# Patient Record
Sex: Female | Born: 1940 | Race: White | Hispanic: No | Marital: Married | Smoking: Former smoker
Health system: Southern US, Community
[De-identification: ages and names within clinical notes are randomized; demographics above are authoritative.]

## PROBLEM LIST (undated history)

## (undated) DIAGNOSIS — J45909 Unspecified asthma, uncomplicated: Secondary | ICD-10-CM

## (undated) DIAGNOSIS — I10 Essential (primary) hypertension: Secondary | ICD-10-CM

## (undated) DIAGNOSIS — S42309A Unspecified fracture of shaft of humerus, unspecified arm, initial encounter for closed fracture: Secondary | ICD-10-CM

## (undated) HISTORY — PX: ABDOMINAL HYSTERECTOMY: SHX81

## (undated) HISTORY — PX: COLONOSCOPY: SHX174

---
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
## 1967-10-26 HISTORY — PX: ANKLE SURGERY: SHX546

## 1978-10-25 HISTORY — PX: LUMBAR DISC SURGERY: SHX700

## 1998-05-23 ENCOUNTER — Ambulatory Visit (HOSPITAL_COMMUNITY): Admission: RE | Admit: 1998-05-23 | Discharge: 1998-05-23 | Payer: Self-pay | Admitting: Internal Medicine

## 2000-01-12 ENCOUNTER — Encounter: Payer: Self-pay | Admitting: Internal Medicine

## 2000-01-12 ENCOUNTER — Ambulatory Visit (HOSPITAL_COMMUNITY): Admission: RE | Admit: 2000-01-12 | Discharge: 2000-01-12 | Payer: Self-pay | Admitting: Internal Medicine

## 2001-01-17 ENCOUNTER — Ambulatory Visit (HOSPITAL_COMMUNITY): Admission: RE | Admit: 2001-01-17 | Discharge: 2001-01-17 | Payer: Self-pay | Admitting: *Deleted

## 2001-01-17 ENCOUNTER — Encounter: Payer: Self-pay | Admitting: Internal Medicine

## 2002-03-14 ENCOUNTER — Encounter: Payer: Self-pay | Admitting: Internal Medicine

## 2002-03-14 ENCOUNTER — Ambulatory Visit (HOSPITAL_COMMUNITY): Admission: RE | Admit: 2002-03-14 | Discharge: 2002-03-14 | Payer: Self-pay | Admitting: Internal Medicine

## 2003-04-05 ENCOUNTER — Ambulatory Visit (HOSPITAL_COMMUNITY): Admission: RE | Admit: 2003-04-05 | Discharge: 2003-04-05 | Payer: Self-pay | Admitting: Orthopaedic Surgery

## 2003-04-05 ENCOUNTER — Encounter: Payer: Self-pay | Admitting: Internal Medicine

## 2004-07-24 ENCOUNTER — Ambulatory Visit (HOSPITAL_COMMUNITY): Admission: RE | Admit: 2004-07-24 | Discharge: 2004-07-24 | Payer: Self-pay | Admitting: Internal Medicine

## 2005-08-26 ENCOUNTER — Ambulatory Visit (HOSPITAL_COMMUNITY): Admission: RE | Admit: 2005-08-26 | Discharge: 2005-08-26 | Payer: Self-pay | Admitting: Internal Medicine

## 2006-10-06 ENCOUNTER — Ambulatory Visit (HOSPITAL_COMMUNITY): Admission: RE | Admit: 2006-10-06 | Discharge: 2006-10-06 | Payer: Self-pay | Admitting: Internal Medicine

## 2007-10-23 ENCOUNTER — Ambulatory Visit (HOSPITAL_COMMUNITY): Admission: RE | Admit: 2007-10-23 | Discharge: 2007-10-23 | Payer: Self-pay | Admitting: Internal Medicine

## 2008-10-24 ENCOUNTER — Ambulatory Visit (HOSPITAL_COMMUNITY): Admission: RE | Admit: 2008-10-24 | Discharge: 2008-10-24 | Payer: Self-pay | Admitting: Internal Medicine

## 2009-12-03 ENCOUNTER — Ambulatory Visit (HOSPITAL_COMMUNITY): Admission: RE | Admit: 2009-12-03 | Discharge: 2009-12-03 | Payer: Self-pay | Admitting: Internal Medicine

## 2010-04-16 ENCOUNTER — Ambulatory Visit (HOSPITAL_COMMUNITY): Admission: RE | Admit: 2010-04-16 | Discharge: 2010-04-16 | Payer: Self-pay | Admitting: Internal Medicine

## 2011-09-28 ENCOUNTER — Other Ambulatory Visit (HOSPITAL_COMMUNITY): Payer: Self-pay | Admitting: Internal Medicine

## 2011-09-28 DIAGNOSIS — Z1231 Encounter for screening mammogram for malignant neoplasm of breast: Secondary | ICD-10-CM

## 2011-09-29 ENCOUNTER — Ambulatory Visit (HOSPITAL_COMMUNITY)
Admission: RE | Admit: 2011-09-29 | Discharge: 2011-09-29 | Disposition: A | Discharge status: Home / Self Care | Payer: BC Managed Care – PPO | Source: Ambulatory Visit | Attending: Internal Medicine | Admitting: Internal Medicine

## 2011-09-29 DIAGNOSIS — Z1231 Encounter for screening mammogram for malignant neoplasm of breast: Secondary | ICD-10-CM | POA: Insufficient documentation

## 2012-10-06 ENCOUNTER — Other Ambulatory Visit (HOSPITAL_COMMUNITY): Payer: Self-pay | Admitting: Internal Medicine

## 2012-10-06 DIAGNOSIS — Z1231 Encounter for screening mammogram for malignant neoplasm of breast: Secondary | ICD-10-CM

## 2012-10-26 ENCOUNTER — Ambulatory Visit (HOSPITAL_COMMUNITY)
Admission: RE | Admit: 2012-10-26 | Discharge: 2012-10-26 | Disposition: A | Discharge status: Home / Self Care | Payer: BC Managed Care – PPO | Source: Ambulatory Visit | Attending: Internal Medicine | Admitting: Internal Medicine

## 2012-10-26 DIAGNOSIS — Z1231 Encounter for screening mammogram for malignant neoplasm of breast: Secondary | ICD-10-CM

## 2013-03-23 ENCOUNTER — Other Ambulatory Visit (HOSPITAL_COMMUNITY): Payer: Self-pay | Admitting: Internal Medicine

## 2013-03-23 DIAGNOSIS — M948X9 Other specified disorders of cartilage, unspecified sites: Secondary | ICD-10-CM

## 2013-04-11 ENCOUNTER — Other Ambulatory Visit (HOSPITAL_COMMUNITY): Payer: Self-pay | Admitting: Internal Medicine

## 2013-04-11 ENCOUNTER — Ambulatory Visit (HOSPITAL_COMMUNITY)
Admission: RE | Admit: 2013-04-11 | Discharge: 2013-04-11 | Disposition: A | Discharge status: Home / Self Care | Payer: BC Managed Care – PPO | Source: Ambulatory Visit | Attending: Internal Medicine | Admitting: Internal Medicine

## 2013-04-11 DIAGNOSIS — Z13820 Encounter for screening for osteoporosis: Secondary | ICD-10-CM | POA: Insufficient documentation

## 2013-04-11 DIAGNOSIS — M948X9 Other specified disorders of cartilage, unspecified sites: Secondary | ICD-10-CM

## 2013-04-11 DIAGNOSIS — Z780 Asymptomatic menopausal state: Secondary | ICD-10-CM | POA: Insufficient documentation

## 2015-02-11 ENCOUNTER — Other Ambulatory Visit (HOSPITAL_COMMUNITY): Payer: Self-pay | Admitting: Internal Medicine

## 2015-02-11 DIAGNOSIS — Z1231 Encounter for screening mammogram for malignant neoplasm of breast: Secondary | ICD-10-CM

## 2015-02-11 DIAGNOSIS — M8580 Other specified disorders of bone density and structure, unspecified site: Secondary | ICD-10-CM

## 2015-02-18 ENCOUNTER — Other Ambulatory Visit (HOSPITAL_COMMUNITY): Payer: Self-pay

## 2015-02-18 ENCOUNTER — Ambulatory Visit (HOSPITAL_COMMUNITY): Payer: Self-pay

## 2015-02-18 ENCOUNTER — Ambulatory Visit (HOSPITAL_COMMUNITY)
Admission: RE | Admit: 2015-02-18 | Discharge: 2015-02-18 | Disposition: A | Discharge status: Home / Self Care | Payer: BLUE CROSS/BLUE SHIELD | Source: Ambulatory Visit | Attending: Internal Medicine | Admitting: Internal Medicine

## 2015-02-18 DIAGNOSIS — Z1231 Encounter for screening mammogram for malignant neoplasm of breast: Secondary | ICD-10-CM | POA: Diagnosis present

## 2015-02-21 ENCOUNTER — Ambulatory Visit (HOSPITAL_COMMUNITY)
Admission: RE | Admit: 2015-02-21 | Discharge: 2015-02-21 | Disposition: A | Discharge status: Home / Self Care | Payer: BLUE CROSS/BLUE SHIELD | Source: Ambulatory Visit | Attending: Internal Medicine | Admitting: Internal Medicine

## 2015-02-21 DIAGNOSIS — M8580 Other specified disorders of bone density and structure, unspecified site: Secondary | ICD-10-CM

## 2015-02-21 DIAGNOSIS — Z13820 Encounter for screening for osteoporosis: Secondary | ICD-10-CM | POA: Diagnosis present

## 2015-02-21 DIAGNOSIS — Z780 Asymptomatic menopausal state: Secondary | ICD-10-CM | POA: Diagnosis not present

## 2015-05-30 ENCOUNTER — Other Ambulatory Visit (HOSPITAL_COMMUNITY): Payer: Self-pay | Admitting: Sports Medicine

## 2015-05-30 ENCOUNTER — Ambulatory Visit (HOSPITAL_COMMUNITY)
Admission: RE | Admit: 2015-05-30 | Discharge: 2015-05-30 | Disposition: A | Discharge status: Home / Self Care | Payer: BLUE CROSS/BLUE SHIELD | Source: Ambulatory Visit | Attending: Cardiovascular Disease | Admitting: Cardiovascular Disease

## 2015-05-30 DIAGNOSIS — M79601 Pain in right arm: Secondary | ICD-10-CM | POA: Diagnosis not present

## 2015-05-30 DIAGNOSIS — M79604 Pain in right leg: Secondary | ICD-10-CM

## 2015-05-30 DIAGNOSIS — M7989 Other specified soft tissue disorders: Secondary | ICD-10-CM | POA: Insufficient documentation

## 2016-02-11 DIAGNOSIS — E785 Hyperlipidemia, unspecified: Secondary | ICD-10-CM | POA: Insufficient documentation

## 2016-02-11 DIAGNOSIS — K5730 Diverticulosis of large intestine without perforation or abscess without bleeding: Secondary | ICD-10-CM | POA: Insufficient documentation

## 2016-02-11 DIAGNOSIS — M8580 Other specified disorders of bone density and structure, unspecified site: Secondary | ICD-10-CM | POA: Insufficient documentation

## 2016-02-11 DIAGNOSIS — I2584 Coronary atherosclerosis due to calcified coronary lesion: Secondary | ICD-10-CM | POA: Insufficient documentation

## 2016-02-11 DIAGNOSIS — J4520 Mild intermittent asthma, uncomplicated: Secondary | ICD-10-CM | POA: Insufficient documentation

## 2016-02-11 DIAGNOSIS — I2510 Atherosclerotic heart disease of native coronary artery without angina pectoris: Secondary | ICD-10-CM | POA: Insufficient documentation

## 2016-02-11 DIAGNOSIS — I10 Essential (primary) hypertension: Secondary | ICD-10-CM | POA: Insufficient documentation

## 2016-02-11 DIAGNOSIS — Z6841 Body Mass Index (BMI) 40.0 and over, adult: Secondary | ICD-10-CM | POA: Insufficient documentation

## 2016-03-04 DIAGNOSIS — Z0000 Encounter for general adult medical examination without abnormal findings: Secondary | ICD-10-CM | POA: Insufficient documentation

## 2021-01-31 ENCOUNTER — Emergency Department (HOSPITAL_COMMUNITY): Payer: BC Managed Care – PPO

## 2021-01-31 ENCOUNTER — Emergency Department (HOSPITAL_COMMUNITY)
Admission: EM | Admit: 2021-01-31 | Discharge: 2021-02-01 | Disposition: A | Discharge status: Home / Self Care | Payer: BC Managed Care – PPO | Attending: Emergency Medicine | Admitting: Emergency Medicine

## 2021-01-31 ENCOUNTER — Other Ambulatory Visit: Payer: Self-pay

## 2021-01-31 ENCOUNTER — Encounter (HOSPITAL_COMMUNITY): Payer: Self-pay | Admitting: Emergency Medicine

## 2021-01-31 DIAGNOSIS — W01198A Fall on same level from slipping, tripping and stumbling with subsequent striking against other object, initial encounter: Secondary | ICD-10-CM | POA: Insufficient documentation

## 2021-01-31 DIAGNOSIS — S60511A Abrasion of right hand, initial encounter: Secondary | ICD-10-CM | POA: Insufficient documentation

## 2021-01-31 DIAGNOSIS — I10 Essential (primary) hypertension: Secondary | ICD-10-CM | POA: Diagnosis not present

## 2021-01-31 DIAGNOSIS — W19XXXA Unspecified fall, initial encounter: Secondary | ICD-10-CM

## 2021-01-31 DIAGNOSIS — Y92009 Unspecified place in unspecified non-institutional (private) residence as the place of occurrence of the external cause: Secondary | ICD-10-CM | POA: Diagnosis not present

## 2021-01-31 DIAGNOSIS — S0990XA Unspecified injury of head, initial encounter: Secondary | ICD-10-CM

## 2021-01-31 DIAGNOSIS — S60512A Abrasion of left hand, initial encounter: Secondary | ICD-10-CM | POA: Diagnosis not present

## 2021-01-31 DIAGNOSIS — S0083XA Contusion of other part of head, initial encounter: Secondary | ICD-10-CM

## 2021-01-31 DIAGNOSIS — J45909 Unspecified asthma, uncomplicated: Secondary | ICD-10-CM | POA: Insufficient documentation

## 2021-01-31 DIAGNOSIS — S0512XA Contusion of eyeball and orbital tissues, left eye, initial encounter: Secondary | ICD-10-CM | POA: Insufficient documentation

## 2021-01-31 HISTORY — DX: Unspecified asthma, uncomplicated: J45.909

## 2021-01-31 HISTORY — DX: Essential (primary) hypertension: I10

## 2021-01-31 NOTE — ED Triage Notes (Addendum)
Per EMS, patient from home, reports trip and fall hitting head on floor. Hematoma to left head. Denies neck and back pain. Denies taking blood thinners. Denies LOC.

## 2021-02-01 MED ORDER — OXYCODONE-ACETAMINOPHEN 5-325 MG PO TABS
1.0000 | ORAL_TABLET | Freq: Once | ORAL | Status: AC
  Administered 2021-02-01: 1 via ORAL
  Filled 2021-02-01: qty 1

## 2021-02-01 MED ORDER — ACETAMINOPHEN 325 MG PO TABS
650.0000 mg | ORAL_TABLET | Freq: Once | ORAL | Status: AC
  Administered 2021-02-01: 650 mg via ORAL
  Filled 2021-02-01: qty 2

## 2021-02-01 NOTE — ED Provider Notes (Signed)
[facility]-EMERGENCY DEPT Provider Note   CSN: [id number] Arrival date & time: 01/31/21  2154     History Chief Complaint  Patient presents with  . Fall    Bethany Wolfe is a 80 y.o. female.  Tripped over an outdoor deck chair and fell hitting both hands and her head.    Fall This is a new problem. The current episode started 1 to 2 hours ago. The problem occurs constantly. The problem has not changed since onset.Pertinent negatives include no chest pain, no abdominal pain, no headaches and no shortness of breath. Nothing aggravates the symptoms. Nothing relieves the symptoms. She has tried nothing for the symptoms. The treatment provided no relief.       Past Medical History:  Diagnosis Date  . Asthma   . Hypertension     There are no problems to display for this patient.    OB History   No obstetric history on file.     No family history on file.     Home Medications Prior to Admission medications   Not on File    Allergies    Alendronate sodium, Codeine, Ibuprofen, Lovastatin, Penicillin g, Pravastatin, Rofecoxib, Tetanus toxoid, Penicillins, and Tetanus toxoids  Review of Systems   Review of Systems  Respiratory: Negative for shortness of breath.   Cardiovascular: Negative for chest pain.  Gastrointestinal: Negative for abdominal pain.  Neurological: Negative for headaches.  All other systems reviewed and are negative.   Physical Exam Updated Vital Signs BP (!) 151/113   Pulse 90   Temp 97.9 F (36.6 C) (Oral)   Resp 16   Ht 5\' 6"  (1.676 m)   Wt 112.5 kg   SpO2 98%   BMI 40.03 kg/m   Physical Exam Vitals and nursing note reviewed.  Constitutional:      Appearance: She is well-developed.  HENT:     Head: Normocephalic.     Comments: Large hematoma over left eye    Mouth/Throat:     Mouth: Mucous membranes are moist.     Pharynx: Oropharynx is clear.  Eyes:     Pupils: Pupils are equal, round, and reactive to  light.  Cardiovascular:     Rate and Rhythm: Normal rate and regular rhythm.  Pulmonary:     Effort: No respiratory distress.     Breath sounds: No stridor.  Abdominal:     General: Abdomen is flat. There is no distension.  Musculoskeletal:        General: Tenderness present. Normal range of motion.     Cervical back: Normal range of motion.     Comments: Abrasions to both hands and small one over hematoma on forehead  Skin:    General: Skin is warm and dry.  Neurological:     General: No focal deficit present.     Mental Status: She is alert.  Psychiatric:        Mood and Affect: Mood normal.     ED Results / Procedures / Treatments   Labs (all labs ordered are listed, but only abnormal results are displayed) Labs Reviewed - No data to display  EKG None  Radiology CT Head Wo Contrast  Result Date: 01/31/2021 CLINICAL DATA:  Fall EXAM: CT HEAD WITHOUT CONTRAST TECHNIQUE: Contiguous axial images were obtained from the base of the skull through the vertex without intravenous contrast. COMPARISON:  None. FINDINGS: Brain: There is no mass, hemorrhage or extra-axial collection. The size and configuration of the ventricles  and extra-axial CSF spaces are normal. The brain parenchyma is normal, without acute or chronic infarction. Vascular: No abnormal hyperdensity of the major intracranial arteries or dural venous sinuses. No intracranial atherosclerosis. Skull: Large left frontal scalp hematoma.  No skull fracture. Sinuses/Orbits: No fluid levels or advanced mucosal thickening of the visualized paranasal sinuses. No mastoid or middle ear effusion. The orbits are normal. IMPRESSION: 1. No acute intracranial abnormality. 2. Large left frontal scalp hematoma without skull fracture. Electronically Signed   By: Deatra Robinson M.D.   On: 01/31/2021 22:46   DG Knee Complete 4 Views Right  Result Date: 02/01/2021 CLINICAL DATA:  Fall EXAM: RIGHT KNEE - COMPLETE 4+ VIEW COMPARISON:  03/27/2015  FINDINGS: No acute displaced fracture or malalignment. Moderate tricompartment arthritis. Chondrocalcinosis. Probable small knee effusion IMPRESSION: 1. No acute osseous abnormality. 2. Chondrocalcinosis. Moderate tricompartment arthritis. Electronically Signed   By: Jasmine Pang M.D.   On: 02/01/2021 00:32   DG Hand Complete Left  Result Date: 02/01/2021 CLINICAL DATA:  Fall with bilateral pain EXAM: LEFT HAND - COMPLETE 3+ VIEW COMPARISON:  None. FINDINGS: No acute displaced fracture or malalignment. Moderate arthritis at the first [facility] joint. Mild cartilaginous calcification. IMPRESSION: No acute osseous abnormality. Electronically Signed   By: Jasmine Pang M.D.   On: 02/01/2021 00:29   DG Hand Complete Right  Result Date: 02/01/2021 CLINICAL DATA:  Fall with bilateral pain EXAM: RIGHT HAND - COMPLETE 3+ VIEW COMPARISON:  None. FINDINGS: No fracture or malalignment. Moderate arthritis at the first [facility] joint. Joint space narrowing and degenerative change at the D IP joints. Cartilaginous calcifications. Ossicle or old fracture adjacent to the ulnar styloid IMPRESSION: 1. No acute osseous abnormality. 2. Chondrocalcinosis. Electronically Signed   By: Jasmine Pang M.D.   On: 02/01/2021 00:30    Procedures Procedures   Medications Ordered in ED Medications  oxyCODONE-acetaminophen (PERCOCET/ROXICET) 5-325 MG per tablet 1 tablet (1 tablet Oral Given 02/01/21 0027)  acetaminophen (TYLENOL) tablet 650 mg (650 mg Oral Given 02/01/21 3335)    ED Course  I have reviewed the triage vital signs and the nursing notes.  Pertinent labs & imaging results that were available during my care of the patient were reviewed by me and considered in my medical decision making (see chart for details).    MDM Rules/Calculators/A&P                          No significant traumatic injuries were found. Pain controlled. Wound care per nursing. expextant management provided. Stable for discharge.   Final Clinical  Impression(s) / ED Diagnoses Final diagnoses:  Fall, initial encounter  Contusion of face, initial encounter  Injury of head, initial encounter    Rx / DC Orders ED Discharge Orders    None       Ilyssa Grennan, Barbara Cower, MD 02/01/21 [phone]

## 2021-09-22 ENCOUNTER — Ambulatory Visit (INDEPENDENT_AMBULATORY_CARE_PROVIDER_SITE_OTHER): Payer: PPO | Admitting: Nurse Practitioner

## 2021-09-22 ENCOUNTER — Other Ambulatory Visit: Payer: Self-pay

## 2021-09-22 ENCOUNTER — Encounter: Payer: Self-pay | Admitting: Nurse Practitioner

## 2021-09-22 VITALS — BP 126/73 | HR 87 | Temp 97.9°F | Ht 65.5 in | Wt 258.0 lb

## 2021-09-22 DIAGNOSIS — I10 Essential (primary) hypertension: Secondary | ICD-10-CM | POA: Diagnosis not present

## 2021-09-22 DIAGNOSIS — Z6841 Body Mass Index (BMI) 40.0 and over, adult: Secondary | ICD-10-CM

## 2021-09-22 DIAGNOSIS — J4520 Mild intermittent asthma, uncomplicated: Secondary | ICD-10-CM

## 2021-09-22 DIAGNOSIS — Z7689 Persons encountering health services in other specified circumstances: Secondary | ICD-10-CM

## 2021-09-22 NOTE — Progress Notes (Signed)
New Patient Office Visit  Subjective:  Patient ID: Bethany Wolfe, female    DOB: [birth year]  Age: 80 y.o. MRN: [id number]  CC:  Chief Complaint  Patient presents with   New Patient (Initial Visit)    HPI Bethany Wolfe presents to establish new primary care provider. Her previous pcp retired in June, 2021 after 40+ years of being in practice. The patient states that she has not seen a provider since her retired. She does have history of HTN. Was on lisinopril 20mg . Has been off the medication for several months. She did gradually weaned herself off after her provider retired. She has been monitoring her blood pressure at home. She states that her systolic number is around 120 and her diastolic number is generally around 80. She denies chest pain, chest pressure, or headache.  She states that she does have history of asthma. She was on maintenance inhaler at one time. Is now just using rescue inhaler when needed. The last time she had to use rescue inhaler was in the beginning of this month.  She states that she does have some issues with constipation. She states that she doesn't always drink enough water. She has tried OTC Dulcolax liquid as well as the capsules. She states that this is intermittent issue and happens about once per month.   Past Medical History:  Diagnosis Date   Asthma    Hypertension     History reviewed. No pertinent surgical history.  History reviewed. No pertinent family history.  Social History   Socioeconomic History   Marital status: Married    Spouse name: Not on file   Number of children: Not on file   Years of education: Not on file   Highest education level: Not on file  Occupational History   Not on file  Tobacco Use   Smoking status: Never   Smokeless tobacco: Never  Substance and Sexual Activity   Alcohol use: Never   Drug use: Never   Sexual activity: Not Currently  Other Topics Concern   Not on file  Social History Narrative   Not  on file   Social Determinants of Health   Financial Resource Strain: Not on file  Food Insecurity: Not on file  Transportation Needs: Not on file  Physical Activity: Not on file  Stress: Not on file  Social Connections: Not on file  Intimate Partner Violence: Not on file    ROS Review of Systems  Constitutional:  Negative for activity change, appetite change, chills, fatigue and fever.  HENT:  Negative for congestion, postnasal drip, rhinorrhea, sinus pressure, sinus pain, sneezing and sore throat.   Eyes: Negative.   Respiratory:  Positive for shortness of breath. Negative for cough, chest tightness and wheezing.        Intermittent shortness of breath due to history of asthma.   Cardiovascular:  Negative for chest pain and palpitations.       Blood pressure elevated at   Gastrointestinal:  Negative for abdominal pain, constipation, diarrhea, nausea and vomiting.  Endocrine: Negative for cold intolerance, heat intolerance, polydipsia and polyuria.  Genitourinary:  Negative for dyspareunia, dysuria, flank pain, frequency and urgency.  Musculoskeletal:  Negative for arthralgias, back pain and myalgias.  Skin:  Negative for rash.  Allergic/Immunologic: Negative for environmental allergies.  Neurological:  Negative for dizziness, weakness and headaches.  Hematological:  Negative for adenopathy.  Psychiatric/Behavioral:  The patient is not nervous/anxious.    Objective:   Today's Vitals  09/22/21 1006 09/22/21 1040  BP: (!) 151/85 126/73  Pulse: 87   Temp: 97.9 F (36.6 C)   SpO2: 96%   Weight: 258 lb (117 kg)   Height: 5' 5.5" (1.664 m)    Body mass index is 42.28 kg/m.   Physical Exam Vitals and nursing note reviewed.  Constitutional:      Appearance: Normal appearance. She is well-developed. She is obese.  HENT:     Head: Normocephalic and atraumatic.     Nose: Nose normal.     Mouth/Throat:     Mouth: Mucous membranes are moist.     Pharynx: Oropharynx is  clear.  Eyes:     Extraocular Movements: Extraocular movements intact.     Conjunctiva/sclera: Conjunctivae normal.     Pupils: Pupils are equal, round, and reactive to light.  Neck:     Vascular: No carotid bruit.  Cardiovascular:     Rate and Rhythm: Normal rate and regular rhythm.     Pulses: Normal pulses.     Heart sounds: Normal heart sounds.  Pulmonary:     Effort: Pulmonary effort is normal.     Breath sounds: Normal breath sounds.  Abdominal:     Palpations: Abdomen is soft.  Musculoskeletal:        General: Normal range of motion.     Cervical back: Normal range of motion and neck supple.  Lymphadenopathy:     Cervical: No cervical adenopathy.  Skin:    General: Skin is warm and dry.     Capillary Refill: Capillary refill takes less than 2 seconds.  Neurological:     General: No focal deficit present.     Mental Status: She is alert and oriented to person, place, and time.  Psychiatric:        Mood and Affect: Mood normal.        Behavior: Behavior normal.        Thought Content: Thought content normal.        Judgment: Judgment normal.    Assessment & Plan:  1. Encounter to establish care Appointment today to establish new primary care provider.  We will get medical records from previous primary care provider to review and update patient chart.  2. Essential hypertension Blood pressure stable.  Continue blood pressure medication as prescribed.  3. Mild intermittent asthma without complication Stable. Continue inhaled medications as previously prescribed.  4. Body mass index (BMI) of 40.1-44.9 in adult [facility]) Discussed lowering calorie intake to 1500 calories per day and incorporating exercise into daily routine to help lose weight.    Problem List Items Addressed This Visit       Cardiovascular and Mediastinum   Essential hypertension   Relevant Medications   lisinopril (ZESTRIL) 20 MG tablet     Respiratory   Mild intermittent asthma   Relevant  Medications   albuterol (VENTOLIN HFA) 108 (90 Base) MCG/ACT inhaler   mometasone-formoterol (DULERA) 200-5 MCG/ACT AERO   Other Visit Diagnoses     Encounter to establish care    -  Primary   Body mass index (BMI) of 40.1-44.9 in adult [facility])           Outpatient Encounter Medications as of 09/22/2021  Medication Sig   albuterol (VENTOLIN HFA) 108 (90 Base) MCG/ACT inhaler INHALE 1 TO 2 PUFFS BY MOUTH EVERY 4 TO 6 HOURS AS NEEDED   Biotin 1000 MCG tablet Take by mouth.   lisinopril (ZESTRIL) 20 MG tablet Take 1 tablet by mouth  daily.   mometasone-formoterol (DULERA) 200-5 MCG/ACT AERO TAKE 2 PUFFS BY MOUTH TWICE A DAY   No facility-administered encounter medications on file as of 09/22/2021.    Follow-up: Return in about 6 weeks (around 11/03/2021) for health maintenance exam, FBW a week prior to visit - add Free T4 to FBW - see below.   Carlean Jews, NP  This note was dictated using Conservation officer, historic buildings. Rapid proofreading was performed to expedite the delivery of the information. Despite proofreading, phonetic errors will occur which are common with this voice recognition software. Please take this into consideration. If there are any concerns, please contact our office.

## 2021-09-22 NOTE — Patient Instructions (Addendum)
Fat and Cholesterol Restricted Eating Plan Getting too much fat and cholesterol in your diet may cause health problems. Choosing the right foods helps keep your fat and cholesterol at normal levels. This can keep you from getting certain diseases. Your doctor may recommend an eating plan that includes: Total fat: ______% or less of total calories a day. This is ______g of fat a day. Saturated fat: ______% or less of total calories a day. This is ______g of saturated fat a day. Cholesterol: less than _________mg a day. Fiber: ______g a day. What are tips for following this plan? General tips Work with your doctor to lose weight if you need to. Avoid: Foods with added sugar. Fried foods. Foods with trans fat or partially hydrogenated oils. This includes some margarines and baked goods. If you drink alcohol: Limit how much you have to: 0-1 drink a day for women who are not pregnant. 0-2 drinks a day for men. Know how much alcohol is in a drink. In the [place] one drink equals one 12 oz bottle of beer (355 mL), one 5 oz glass of wine (148 mL), or one 1 oz glass of hard liquor (44 mL). Reading food labels Check food labels for: Trans fats. Partially hydrogenated oils. Saturated fat (g) in each serving. Cholesterol (mg) in each serving. Fiber (g) in each serving. Choose foods with healthy fats, such as: Monounsaturated fats and polyunsaturated fats. These include olive and canola oil, flaxseeds, walnuts, almonds, and seeds. Omega-3 fats. These are found in certain fish, flaxseed oil, and ground flaxseeds. Choose grain products that have whole grains. Look for the word "whole" as the first word in the ingredient list. Cooking Cook foods using low-fat methods. These include baking, boiling, grilling, and broiling. Eat more home-cooked foods. Eat at restaurants and buffets less often. Eat less fast food. Avoid cooking using saturated fats, such as butter, cream, palm oil, palm kernel oil, and  coconut oil. Meal planning  At meals, divide your plate into four equal parts: Fill one-half of your plate with vegetables, green salads, and fruit. Fill one-fourth of your plate with whole grains. Fill one-fourth of your plate with low-fat (lean) protein foods. Eat fish that is high in omega-3 fats at least two times a week. This includes mackerel, tuna, sardines, and salmon. Eat foods that are high in fiber, such as whole grains, beans, apples, pears, berries, broccoli, carrots, peas, and barley. What foods should I eat? Fruits All fresh, canned (in natural juice), or frozen fruits. Vegetables Fresh or frozen vegetables (raw, steamed, roasted, or grilled). Green salads. Grains Whole grains, such as whole wheat or whole grain breads, crackers, cereals, and pasta. Unsweetened oatmeal, bulgur, barley, quinoa, or brown rice. Corn or whole wheat flour tortillas. Meats and other protein foods Ground beef (85% or leaner), grass-fed beef, or beef trimmed of fat. Skinless chicken or turkey. Ground chicken or turkey. Pork trimmed of fat. All fish and seafood. Egg whites. Dried beans, peas, or lentils. Unsalted nuts or seeds. Unsalted canned beans. Nut butters without added sugar or oil. Dairy Low-fat or nonfat dairy products, such as skim or 1% milk, 2% or reduced-fat cheeses, low-fat and fat-free ricotta or cottage cheese, or plain low-fat and nonfat yogurt. Fats and oils Tub margarine without trans fats. Light or reduced-fat mayonnaise and salad dressings. Avocado. Olive, canola, sesame, or safflower oils. The items listed above may not be a complete list of foods and beverages you can eat. Contact a dietitian for more information. What foods   should I avoid? Fruits Canned fruit in heavy syrup. Fruit in cream or butter sauce. Fried fruit. Vegetables Vegetables cooked in cheese, cream, or butter sauce. Fried vegetables. Grains White bread. White pasta. White rice. Cornbread. Bagels, pastries,  and croissants. Crackers and snack foods that contain trans fat and hydrogenated oils. Meats and other protein foods Fatty cuts of meat. Ribs, chicken wings, bacon, sausage, bologna, salami, chitterlings, fatback, hot dogs, bratwurst, and packaged lunch meats. Liver and organ meats. Whole eggs and egg yolks. Chicken and turkey with skin. Fried meat. Dairy Whole or 2% milk, cream, half-and-half, and cream cheese. Whole milk cheeses. Whole-fat or sweetened yogurt. Full-fat cheeses. Nondairy creamers and whipped toppings. Processed cheese, cheese spreads, and cheese curds. Fats and oils Butter, stick margarine, lard, shortening, ghee, or bacon fat. Coconut, palm kernel, and palm oils. Beverages Alcohol. Sugar-sweetened drinks such as sodas, lemonade, and fruit drinks. Sweets and desserts Corn syrup, sugars, honey, and molasses. Candy. Jam and jelly. Syrup. Sweetened cereals. Cookies, pies, cakes, donuts, muffins, and ice cream. The items listed above may not be a complete list of foods and beverages you should avoid. Contact a dietitian for more information. Summary Choosing the right foods helps keep your fat and cholesterol at normal levels. This can keep you from getting certain diseases. At meals, fill one-half of your plate with vegetables, green salads, and fruits. Eat high fiber foods, like whole grains, beans, apples, pears, berries, carrots, peas, and barley. Limit added sugar, saturated fats, alcohol, and fried foods. This information is not intended to replace advice given to you by your health care provider. Make sure you discuss any questions you have with your health care provider. Document Revised: 02/20/2021 Document Reviewed: 02/20/2021 Elsevier Patient Education  2022 Else[employer] Inc.  Fat and Cholesterol Restricted Eating Plan Getting too much fat and cholesterol in your diet may cause health problems. Choosing the right foods helps keep your fat and cholesterol at normal levels.  This can keep you from getting certain diseases. Your doctor may recommend an eating plan that includes: Total fat: ______% or less of total calories a day. This is ______g of fat a day. Saturated fat: ______% or less of total calories a day. This is ______g of saturated fat a day. Cholesterol: less than _________mg a day. Fiber: ______g a day. What are tips for following this plan? General tips Work with your doctor to lose weight if you need to. Avoid: Foods with added sugar. Fried foods. Foods with trans fat or partially hydrogenated oils. This includes some margarines and baked goods. If you drink alcohol: Limit how much you have to: 0-1 drink a day for women who are not pregnant. 0-2 drinks a day for men. Know how much alcohol is in a drink. In the [place] one drink equals one 12 oz bottle of beer (355 mL), one 5 oz glass of wine (148 mL), or one 1 oz glass of hard liquor (44 mL). Reading food labels Check food labels for: Trans fats. Partially hydrogenated oils. Saturated fat (g) in each serving. Cholesterol (mg) in each serving. Fiber (g) in each serving. Choose foods with healthy fats, such as: Monounsaturated fats and polyunsaturated fats. These include olive and canola oil, flaxseeds, walnuts, almonds, and seeds. Omega-3 fats. These are found in certain fish, flaxseed oil, and ground flaxseeds. Choose grain products that have whole grains. Look for the word "whole" as the first word in the ingredient list. Cooking Cook foods using low-fat methods. These include baking, boiling, grilling,   and broiling. Eat more home-cooked foods. Eat at restaurants and buffets less often. Eat less fast food. Avoid cooking using saturated fats, such as butter, cream, palm oil, palm kernel oil, and coconut oil. Meal planning  At meals, divide your plate into four equal parts: Fill one-half of your plate with vegetables, green salads, and fruit. Fill one-fourth of your plate with whole  grains. Fill one-fourth of your plate with low-fat (lean) protein foods. Eat fish that is high in omega-3 fats at least two times a week. This includes mackerel, tuna, sardines, and salmon. Eat foods that are high in fiber, such as whole grains, beans, apples, pears, berries, broccoli, carrots, peas, and barley. What foods should I eat? Fruits All fresh, canned (in natural juice), or frozen fruits. Vegetables Fresh or frozen vegetables (raw, steamed, roasted, or grilled). Green salads. Grains Whole grains, such as whole wheat or whole grain breads, crackers, cereals, and pasta. Unsweetened oatmeal, bulgur, barley, quinoa, or brown rice. Corn or whole wheat flour tortillas. Meats and other protein foods Ground beef (85% or leaner), grass-fed beef, or beef trimmed of fat. Skinless chicken or turkey. Ground chicken or turkey. Pork trimmed of fat. All fish and seafood. Egg whites. Dried beans, peas, or lentils. Unsalted nuts or seeds. Unsalted canned beans. Nut butters without added sugar or oil. Dairy Low-fat or nonfat dairy products, such as skim or 1% milk, 2% or reduced-fat cheeses, low-fat and fat-free ricotta or cottage cheese, or plain low-fat and nonfat yogurt. Fats and oils Tub margarine without trans fats. Light or reduced-fat mayonnaise and salad dressings. Avocado. Olive, canola, sesame, or safflower oils. The items listed above may not be a complete list of foods and beverages you can eat. Contact a dietitian for more information. What foods should I avoid? Fruits Canned fruit in heavy syrup. Fruit in cream or butter sauce. Fried fruit. Vegetables Vegetables cooked in cheese, cream, or butter sauce. Fried vegetables. Grains White bread. White pasta. White rice. Cornbread. Bagels, pastries, and croissants. Crackers and snack foods that contain trans fat and hydrogenated oils. Meats and other protein foods Fatty cuts of meat. Ribs, chicken wings, bacon, sausage, bologna, salami,  chitterlings, fatback, hot dogs, bratwurst, and packaged lunch meats. Liver and organ meats. Whole eggs and egg yolks. Chicken and turkey with skin. Fried meat. Dairy Whole or 2% milk, cream, half-and-half, and cream cheese. Whole milk cheeses. Whole-fat or sweetened yogurt. Full-fat cheeses. Nondairy creamers and whipped toppings. Processed cheese, cheese spreads, and cheese curds. Fats and oils Butter, stick margarine, lard, shortening, ghee, or bacon fat. Coconut, palm kernel, and palm oils. Beverages Alcohol. Sugar-sweetened drinks such as sodas, lemonade, and fruit drinks. Sweets and desserts Corn syrup, sugars, honey, and molasses. Candy. Jam and jelly. Syrup. Sweetened cereals. Cookies, pies, cakes, donuts, muffins, and ice cream. The items listed above may not be a complete list of foods and beverages you should avoid. Contact a dietitian for more information. Summary Choosing the right foods helps keep your fat and cholesterol at normal levels. This can keep you from getting certain diseases. At meals, fill one-half of your plate with vegetables, green salads, and fruits. Eat high fiber foods, like whole grains, beans, apples, pears, berries, carrots, peas, and barley. Limit added sugar, saturated fats, alcohol, and fried foods. This information is not intended to replace advice given to you by your health care provider. Make sure you discuss any questions you have with your health care provider. Document Revised: 02/20/2021 Document Reviewed: 02/20/2021 Elsevier Patient Education    2022 Elsevier Inc.  

## 2021-10-14 ENCOUNTER — Other Ambulatory Visit: Payer: Self-pay

## 2021-10-14 DIAGNOSIS — Z7689 Persons encountering health services in other specified circumstances: Secondary | ICD-10-CM

## 2021-10-14 DIAGNOSIS — Z0000 Encounter for general adult medical examination without abnormal findings: Secondary | ICD-10-CM

## 2021-10-14 DIAGNOSIS — E0590 Thyrotoxicosis, unspecified without thyrotoxic crisis or storm: Secondary | ICD-10-CM

## 2021-10-23 ENCOUNTER — Other Ambulatory Visit: Payer: Self-pay | Admitting: Nurse Practitioner

## 2021-10-23 DIAGNOSIS — Z6841 Body Mass Index (BMI) 40.0 and over, adult: Secondary | ICD-10-CM

## 2021-10-23 DIAGNOSIS — E785 Hyperlipidemia, unspecified: Secondary | ICD-10-CM

## 2021-10-23 DIAGNOSIS — E0590 Thyrotoxicosis, unspecified without thyrotoxic crisis or storm: Secondary | ICD-10-CM

## 2021-10-23 DIAGNOSIS — I10 Essential (primary) hypertension: Secondary | ICD-10-CM

## 2021-10-23 DIAGNOSIS — Z0000 Encounter for general adult medical examination without abnormal findings: Secondary | ICD-10-CM

## 2021-10-27 ENCOUNTER — Other Ambulatory Visit: Payer: PPO

## 2021-11-03 ENCOUNTER — Encounter: Payer: PPO | Admitting: Nurse Practitioner

## 2021-11-04 ENCOUNTER — Other Ambulatory Visit: Payer: Self-pay

## 2021-11-04 DIAGNOSIS — Z0000 Encounter for general adult medical examination without abnormal findings: Secondary | ICD-10-CM

## 2021-11-04 DIAGNOSIS — E0590 Thyrotoxicosis, unspecified without thyrotoxic crisis or storm: Secondary | ICD-10-CM

## 2021-11-04 DIAGNOSIS — R5383 Other fatigue: Secondary | ICD-10-CM

## 2021-11-04 DIAGNOSIS — R7301 Impaired fasting glucose: Secondary | ICD-10-CM

## 2021-11-05 ENCOUNTER — Other Ambulatory Visit: Payer: Self-pay

## 2021-11-05 ENCOUNTER — Other Ambulatory Visit: Payer: PPO

## 2021-11-05 DIAGNOSIS — R5383 Other fatigue: Secondary | ICD-10-CM | POA: Diagnosis not present

## 2021-11-05 DIAGNOSIS — Z0000 Encounter for general adult medical examination without abnormal findings: Secondary | ICD-10-CM

## 2021-11-05 DIAGNOSIS — Z7689 Persons encountering health services in other specified circumstances: Secondary | ICD-10-CM | POA: Diagnosis not present

## 2021-11-05 DIAGNOSIS — R7301 Impaired fasting glucose: Secondary | ICD-10-CM

## 2021-11-05 DIAGNOSIS — E0590 Thyrotoxicosis, unspecified without thyrotoxic crisis or storm: Secondary | ICD-10-CM

## 2021-11-06 LAB — CBC WITH DIFFERENTIAL/PLATELET
Basophils Absolute: 0.1 10*3/uL (ref 0.0–0.2)
Basos: 1 %
EOS (ABSOLUTE): 0.2 10*3/uL (ref 0.0–0.4)
Eos: 3 %
Hematocrit: 45.4 % (ref 34.0–46.6)
Hemoglobin: 15.1 g/dL (ref 11.1–15.9)
Immature Grans (Abs): 0 10*3/uL (ref 0.0–0.1)
Immature Granulocytes: 0 %
Lymphocytes Absolute: 1.3 10*3/uL (ref 0.7–3.1)
Lymphs: 21 %
MCH: 27.3 pg (ref 26.6–33.0)
MCHC: 33.3 g/dL (ref 31.5–35.7)
MCV: 82 fL (ref 79–97)
Monocytes Absolute: 0.4 10*3/uL (ref 0.1–0.9)
Monocytes: 7 %
Neutrophils Absolute: 4.3 10*3/uL (ref 1.4–7.0)
Neutrophils: 68 %
Platelets: 215 10*3/uL (ref 150–450)
RBC: 5.54 x10E6/uL — ABNORMAL HIGH (ref 3.77–5.28)
RDW: 14 % (ref 11.7–15.4)
WBC: 6.3 10*3/uL (ref 3.4–10.8)

## 2021-11-06 LAB — COMPREHENSIVE METABOLIC PANEL
ALT: 13 IU/L (ref 0–32)
AST: 16 IU/L (ref 0–40)
Albumin/Globulin Ratio: 1.8 (ref 1.2–2.2)
Albumin: 4.3 g/dL (ref 3.7–4.7)
Alkaline Phosphatase: 78 IU/L (ref 44–121)
BUN/Creatinine Ratio: 19 (ref 12–28)
BUN: 16 mg/dL (ref 8–27)
Bilirubin Total: 0.5 mg/dL (ref 0.0–1.2)
CO2: 26 mmol/L (ref 20–29)
Calcium: 10 mg/dL (ref 8.7–10.3)
Chloride: 103 mmol/L (ref 96–106)
Creatinine, Ser: 0.83 mg/dL (ref 0.57–1.00)
Globulin, Total: 2.4 g/dL (ref 1.5–4.5)
Glucose: 124 mg/dL — ABNORMAL HIGH (ref 70–99)
Potassium: 4.6 mmol/L (ref 3.5–5.2)
Sodium: 142 mmol/L (ref 134–144)
Total Protein: 6.7 g/dL (ref 6.0–8.5)
eGFR: 71 mL/min/{1.73_m2} (ref >59)

## 2021-11-06 LAB — LIPID PANEL
Chol/HDL Ratio: 4.4 ratio (ref 0.0–4.4)
Cholesterol, Total: 183 mg/dL (ref 100–199)
HDL: 42 mg/dL (ref >39)
LDL Chol Calc (NIH): 108 mg/dL — ABNORMAL HIGH (ref 0–99)
Triglycerides: 190 mg/dL — ABNORMAL HIGH (ref 0–149)
VLDL Cholesterol Cal: 33 mg/dL (ref 5–40)

## 2021-11-06 LAB — HEMOGLOBIN A1C
Est. average glucose Bld gHb Est-mCnc: 120 mg/dL
Hgb A1c MFr Bld: 5.8 % — ABNORMAL HIGH (ref 4.8–5.6)

## 2021-11-06 LAB — T4, FREE: Free T4: 1.13 ng/dL (ref 0.82–1.77)

## 2021-11-06 LAB — TSH: TSH: 1.85 u[IU]/mL (ref 0.450–4.500)

## 2021-11-10 ENCOUNTER — Encounter: Payer: PPO | Admitting: Nurse Practitioner

## 2021-11-18 ENCOUNTER — Ambulatory Visit (INDEPENDENT_AMBULATORY_CARE_PROVIDER_SITE_OTHER): Payer: PPO | Admitting: Nurse Practitioner

## 2021-11-18 ENCOUNTER — Other Ambulatory Visit: Payer: Self-pay

## 2021-11-18 ENCOUNTER — Encounter: Payer: Self-pay | Admitting: Nurse Practitioner

## 2021-11-18 VITALS — BP 130/76 | HR 78 | Temp 97.8°F | Ht 65.5 in | Wt 254.4 lb

## 2021-11-18 DIAGNOSIS — Z6841 Body Mass Index (BMI) 40.0 and over, adult: Secondary | ICD-10-CM | POA: Diagnosis not present

## 2021-11-18 DIAGNOSIS — M8580 Other specified disorders of bone density and structure, unspecified site: Secondary | ICD-10-CM | POA: Diagnosis not present

## 2021-11-18 DIAGNOSIS — Z0000 Encounter for general adult medical examination without abnormal findings: Secondary | ICD-10-CM

## 2021-11-18 DIAGNOSIS — Z1231 Encounter for screening mammogram for malignant neoplasm of breast: Secondary | ICD-10-CM | POA: Diagnosis not present

## 2021-11-18 NOTE — Progress Notes (Signed)
Subjective:   Bethany SpearingJudith H Wolfe is a 81 y.o. female who presents for Medicare Annual (Subsequent) preventive examination.  -states that she had COVID 19 shortly after Christmas. Blood pressures have been slightly higher than usual. About what they are running today. She denies chest pain, chest pressure, or shortness of breath. She denies headaches or visual disturbances. She denies abdominal pain, nausea, vomiting, or changes in bowel or bladder habits.     Review of Systems    Review of Systems  Constitutional:  Negative for fever, malaise/fatigue and weight loss.  HENT:  Negative for congestion, ear discharge, ear pain, hearing loss and sore throat.   Eyes: Negative.   Respiratory:  Negative for cough, shortness of breath and wheezing.   Cardiovascular:  Negative for chest pain, palpitations, orthopnea, claudication and leg swelling.       Mildly elevated blood pressure recently   Gastrointestinal:  Negative for abdominal pain, blood in stool, constipation, diarrhea, heartburn, nausea and vomiting.  Genitourinary:  Negative for dysuria, flank pain, frequency and urgency.  Musculoskeletal:  Negative for back pain and myalgias.  Skin:  Negative for itching and rash.  Neurological:  Negative for dizziness, weakness and headaches.  Psychiatric/Behavioral:  Negative for depression.          Objective:    Today's Vitals   11/18/21 1058 11/18/21 1116  BP: (!) 149/79 130/76  Pulse: 78   Temp: 97.8 F (36.6 C)   SpO2: 98%   Weight: 254 lb 6.4 oz (115.4 kg)   Height: 5' 5.5" (1.664 m)    Body mass index is 41.69 kg/m.  BP Readings from Last 3 Encounters:  11/18/21 130/76  09/22/21 126/73  01/31/21 (!) 151/113    Physical Exam Vitals and nursing note reviewed.  Constitutional:      Appearance: Normal appearance. She is well-developed.  HENT:     Head: Normocephalic and atraumatic.     Right Ear: Tympanic membrane, ear canal and external ear normal.     Left Ear: Tympanic  membrane, ear canal and external ear normal.     Nose: Nose normal.     Mouth/Throat:     Mouth: Mucous membranes are moist.     Pharynx: Oropharynx is clear.  Eyes:     Extraocular Movements: Extraocular movements intact.     Conjunctiva/sclera: Conjunctivae normal.     Pupils: Pupils are equal, round, and reactive to light.  Neck:     Vascular: No carotid bruit.  Cardiovascular:     Rate and Rhythm: Normal rate and regular rhythm.     Pulses: Normal pulses.     Heart sounds: Normal heart sounds.  Pulmonary:     Effort: Pulmonary effort is normal.     Breath sounds: Normal breath sounds.  Abdominal:     General: Bowel sounds are normal. There is no distension.     Palpations: Abdomen is soft. There is no mass.     Tenderness: There is no abdominal tenderness. There is no guarding or rebound.     Hernia: No hernia is present.  Musculoskeletal:        General: Normal range of motion.     Cervical back: Normal range of motion and neck supple.  Lymphadenopathy:     Cervical: No cervical adenopathy.  Skin:    General: Skin is warm and dry.     Capillary Refill: Capillary refill takes less than 2 seconds.  Neurological:     General: No focal deficit present.  Mental Status: She is alert and oriented to person, place, and time.  Psychiatric:        Mood and Affect: Mood normal.        Behavior: Behavior normal.        Thought Content: Thought content normal.        Judgment: Judgment normal.     Advanced Directives 01/31/2021  Does Patient Have a Medical Advance Directive? No  Would patient like information on creating a medical advance directive? No - Patient declined    Current Medications (verified) Outpatient Encounter Medications as of 11/18/2021  Medication Sig   albuterol (VENTOLIN HFA) 108 (90 Base) MCG/ACT inhaler INHALE 1 TO 2 PUFFS BY MOUTH EVERY 4 TO 6 HOURS AS NEEDED   [DISCONTINUED] Biotin 1000 MCG tablet Take by mouth.   [DISCONTINUED] lisinopril (ZESTRIL)  20 MG tablet Take 1 tablet by mouth daily.   [DISCONTINUED] mometasone-formoterol (DULERA) 200-5 MCG/ACT AERO TAKE 2 PUFFS BY MOUTH TWICE A DAY   No facility-administered encounter medications on file as of 11/18/2021.    Allergies (verified) Alendronate sodium, Codeine, Ibuprofen, Lovastatin, Penicillin g, Pravastatin, Rofecoxib, Tetanus toxoid, Penicillins, and Tetanus toxoids   History: Past Medical History:  Diagnosis Date   Asthma    Hypertension    History reviewed. No pertinent surgical history. History reviewed. No pertinent family history. Social History   Socioeconomic History   Marital status: Married    Spouse name: Not on file   Number of children: Not on file   Years of education: Not on file   Highest education level: Not on file  Occupational History   Not on file  Tobacco Use   Smoking status: Never   Smokeless tobacco: Never  Substance and Sexual Activity   Alcohol use: Never   Drug use: Never   Sexual activity: Not Currently  Other Topics Concern   Not on file  Social History Narrative   Not on file   Social Determinants of Health   Financial Resource Strain: Not on file  Food Insecurity: Not on file  Transportation Needs: Not on file  Physical Activity: Not on file  Stress: Not on file  Social Connections: Not on file    Tobacco Counseling Counseling given: Not Answered   Diabetic?no  Activities of Daily Living In your present state of health, do you have any difficulty performing the following activities: 11/18/2021 09/22/2021  Hearing? N N  Vision? N N  Difficulty concentrating or making decisions? N N  Walking or climbing stairs? Y N  Dressing or bathing? N N  Doing errands, shopping? N N  Some recent data might be hidden    Patient Care Team: Carlean Jews, NP as PCP - General (Family Medicine)  Indicate any recent Medical Services you may have received from other than [facility] providers in the past year (date may be  approximate).     Assessment:   1. Encounter for Medicare annual wellness exam Annual medicare wellness visit today  2. Body mass index (BMI) of 40.1-44.9 in adult [facility]) Discussed lowering calorie intake to 1500 calories per day and incorporating exercise into daily routine to help lose weight.   3. Osteopenia, unspecified location History of bone loss. Will repeat bone density test and treat osteoporosis as indicated  - DG Bone Density; Future  4. Encounter for screening mammogram for malignant neoplasm of breast Screening mammogram ordered today  - MM DIGITAL SCREENING BILATERAL; Future   Hearing/Vision screen No results found.   Depression  Screen PHQ 2/9 Scores 11/18/2021 09/22/2021  PHQ - 2 Score 0 0  PHQ- 9 Score 2 2    Fall Risk Fall Risk  11/18/2021 09/22/2021  Falls in the past year? 1 1  Number falls in past yr: 0 0  Injury with Fall? 0 1  Risk for fall due to : - History of fall(s)  Follow up Falls evaluation completed Falls evaluation completed    FALL RISK PREVENTION PERTAINING TO THE HOME:  Any stairs in or around the home? Yes  If so, are there any without handrails? Yes  Home free of loose throw rugs in walkways, pet beds, electrical cords, etc? Yes  Adequate lighting in your home to reduce risk of falls? Yes   ASSISTIVE DEVICES UTILIZED TO PREVENT FALLS:  Life alert? Yes  Use of a cane, walker or w/c? Yes  Grab bars in the bathroom? No  Shower chair or bench in shower? No  Elevated toilet seat or a handicapped toilet? No   TIMED UP AND GO:  Was the test performed? Yes .  Length of time to ambulate 10 feet: 17 sec.   Gait slow and steady without use of assistive device  Cognitive Function:     6CIT Screen 11/18/2021  What Year? 0 points  What month? 0 points  What time? 0 points  Count back from 20 0 points  Months in reverse 0 points  Repeat phrase 0 points  Total Score 0    Immunizations Immunization History  Administered Date(s)  Administered   PFIZER(Purple Top)SARS-COV-2 Vaccination 11/15/2019, 12/01/2019   Pneumococcal Conjugate-13 05/17/2014   Pneumococcal Polysaccharide-23 07/29/2009    TDAP status: Due, Education has been provided regarding the importance of this vaccine. Advised may receive this vaccine at local pharmacy or Health Dept. Aware to provide a copy of the vaccination record if obtained from local pharmacy or Health Dept. Verbalized acceptance and understanding.  Flu Vaccine status: Declined, Education has been provided regarding the importance of this vaccine but patient still declined. Advised may receive this vaccine at local pharmacy or Health Dept. Aware to provide a copy of the vaccination record if obtained from local pharmacy or Health Dept. Verbalized acceptance and understanding.  Pneumococcal vaccine status: Declined,  Education has been provided regarding the importance of this vaccine but patient still declined. Advised may receive this vaccine at local pharmacy or Health Dept. Aware to provide a copy of the vaccination record if obtained from local pharmacy or Health Dept. Verbalized acceptance and understanding.   Covid-19 vaccine status: Completed vaccines  Qualifies for Shingles Vaccine? No   Zostavax completed No   Shingrix Completed?: No.    Education has been provided regarding the importance of this vaccine. Patient has been advised to call insurance company to determine out of pocket expense if they have not yet received this vaccine. Advised may also receive vaccine at local pharmacy or Health Dept. Verbalized acceptance and understanding.  Screening Tests Health Maintenance  Topic Date Due   COVID-19 Vaccine (3 - Booster) 01/26/2020   INFLUENZA VACCINE  01/22/2022 (Originally 05/25/2021)   Zoster Vaccines- Shingrix (1 of 2) 02/16/2022 (Originally [birth year]/05/1991)   TETANUS/TDAP  11/18/2022 (Originally 02/05/1960)   Pneumonia Vaccine 58+ Years old  Completed   DEXA SCAN  Completed    HPV VACCINES  Aged Out    Health Maintenance  Health Maintenance Due  Topic Date Due   COVID-19 Vaccine (3 - Booster) 01/26/2020    Colorectal cancer screening: No longer required.  Mammogram status: No longer required due to age out.  Bone Density status: Completed yes. Results reflect: Bone density results: OSTEOPENIA. Repeat every n/a years.  Lung Cancer Screening: (Low Dose CT Chest recommended if Age 70-80 years, 30 pack-year currently smoking OR have quit w/in 15years.) does not qualify.   Lung Cancer Screening Referral: no  Additional Screening:  Hepatitis C Screening: does not qualify; Completed yes  Vision Screening: Recommended annual ophthalmology exams for early detection of glaucoma and other disorders of the eye. Is the patient up to date with their annual eye exam?  Yes  Who is the provider or what is the name of the office in which the patient attends annual eye exams? [facility] If pt is not established with a provider, would they like to be referred to a provider to establish care? No .   Dental Screening: Recommended annual dental exams for proper oral hygiene  Community Resource Referral / Chronic Care Management: CRR required this visit?  No   CCM required this visit?  No      Plan:     I have personally reviewed and noted the following in the patients chart:   Medical and social history Use of alcohol, tobacco or illicit drugs  Current medications and supplements including opioid prescriptions.  Functional ability and status Nutritional status Physical activity Advanced directives List of other physicians Hospitalizations, surgeries, and ER visits in previous 12 months Vitals Screenings to include cognitive, depression, and falls Referrals and appointments  In addition, I have reviewed and discussed with patient certain preventive protocols, quality metrics, and best practice recommendations. A written personalized care plan for  preventive services as well as general preventive health recommendations were provided to patient.     Vincent GrosHeather Emon Miggins, FNP-c  11/25/2021

## 2021-11-25 DIAGNOSIS — Z6841 Body Mass Index (BMI) 40.0 and over, adult: Secondary | ICD-10-CM | POA: Insufficient documentation

## 2022-01-01 ENCOUNTER — Telehealth: Payer: Self-pay | Admitting: Nurse Practitioner

## 2022-01-01 ENCOUNTER — Other Ambulatory Visit: Payer: Self-pay | Admitting: Nurse Practitioner

## 2022-01-01 DIAGNOSIS — J4521 Mild intermittent asthma with (acute) exacerbation: Secondary | ICD-10-CM

## 2022-01-01 MED ORDER — METHYLPREDNISOLONE 4 MG PO TBPK: ORAL_TABLET | ORAL | 0 refills | Status: DC

## 2022-01-01 NOTE — Telephone Encounter (Signed)
Sent medrol taper - take as directed for 6 days - to [facility]

## 2022-01-01 NOTE — Progress Notes (Signed)
Sent medrol taper - take as directed for 6 days - to [facility] [address]

## 2022-01-01 NOTE — Telephone Encounter (Signed)
Patient called stating she feels like she has bronchitis and wants to know if you would call her in prednisone? Please advise. [phone] ?

## 2022-01-02 DIAGNOSIS — R062 Wheezing: Secondary | ICD-10-CM | POA: Diagnosis not present

## 2022-01-02 DIAGNOSIS — J209 Acute bronchitis, unspecified: Secondary | ICD-10-CM | POA: Diagnosis not present

## 2022-01-02 DIAGNOSIS — J45909 Unspecified asthma, uncomplicated: Secondary | ICD-10-CM | POA: Diagnosis not present

## 2022-01-04 NOTE — Telephone Encounter (Signed)
[email or web address]

## 2022-01-06 ENCOUNTER — Encounter: Payer: Self-pay | Admitting: Nurse Practitioner

## 2022-01-06 ENCOUNTER — Ambulatory Visit (INDEPENDENT_AMBULATORY_CARE_PROVIDER_SITE_OTHER): Payer: PPO | Admitting: Nurse Practitioner

## 2022-01-06 ENCOUNTER — Other Ambulatory Visit: Payer: Self-pay

## 2022-01-06 VITALS — BP 112/71 | HR 83 | Temp 98.4°F | Ht 65.5 in | Wt 251.4 lb

## 2022-01-06 DIAGNOSIS — J4541 Moderate persistent asthma with (acute) exacerbation: Secondary | ICD-10-CM | POA: Diagnosis not present

## 2022-01-06 MED ORDER — DOXYCYCLINE HYCLATE 100 MG PO TABS: 100.0000 mg | ORAL_TABLET | Freq: Two times a day (BID) | ORAL | 0 refills | Status: DC

## 2022-01-06 MED ORDER — ALBUTEROL SULFATE 1.25 MG/3ML IN NEBU: 1.0000 | INHALATION_SOLUTION | Freq: Four times a day (QID) | RESPIRATORY_TRACT | 12 refills | Status: DC | PRN

## 2022-01-06 MED ORDER — PREDNISONE 10 MG (21) PO TBPK: ORAL_TABLET | ORAL | 0 refills | Status: DC

## 2022-01-06 NOTE — Progress Notes (Signed)
Established patient visit ? ? ?Patient: Bethany Wolfe   DOB: 1941/01/10   81 y.o. Female  MRN: [id number] ?Visit Date: 01/06/2022 ? ?Chief Complaint  ?Patient presents with  ? Cough  ? ?Subjective  ?  ?The patient did go to urgent care last week. Was diagnosed with bronchitis based on chest x-ray. Was put on doxycycline 100mg  twice daily for 6 days and prednisone 40mg  daily for three days. She states that initially, she did feel better. Then started t feel bad and now is feeling worse then when she was initially seen. She is taking Mucinex DM which is not helping right now.  ? ?Cough ?This is a new problem. The current episode started 1 to 4 weeks ago. The problem has been waxing and waning. The problem occurs every few minutes. The cough is Productive of sputum (states that everything she coughs up is clear). Associated symptoms include headaches, nasal congestion, postnasal drip, rhinorrhea and wheezing. Pertinent negatives include no chest pain, chills, fever, myalgias, rash, sore throat, shortness of breath, sweats or weight loss. The symptoms are aggravated by lying down. She has tried a beta-agonist inhaler and oral steroids (oral antibiotics) for the symptoms. The treatment provided mild relief. Her past medical history is significant for asthma and environmental allergies.   ? ? ?Medications: ?Outpatient Medications Prior to Visit  ?Medication Sig  ? albuterol (VENTOLIN HFA) 108 (90 Base) MCG/ACT inhaler INHALE 1 TO 2 PUFFS BY MOUTH EVERY 4 TO 6 HOURS AS NEEDED  ? [DISCONTINUED] methylPREDNISolone (MEDROL) 4 MG TBPK tablet Take by mouth as directed for 6 days  ? ?No facility-administered medications prior to visit.  ? ? ?Review of Systems  ?Constitutional:  Positive for appetite change and fatigue. Negative for activity change, chills, fever and weight loss.  ?     States that food tastes bad since using inhaler more often  ?HENT:  Positive for congestion, postnasal drip and rhinorrhea. Negative for sinus  pressure, sinus pain, sneezing and sore throat.   ?Eyes: Negative.   ?Respiratory:  Positive for cough and wheezing. Negative for chest tightness and shortness of breath.   ?Cardiovascular:  Negative for chest pain and palpitations.  ?Gastrointestinal:  Negative for abdominal pain, constipation, diarrhea, nausea and vomiting.  ?Endocrine: Negative for cold intolerance, heat intolerance, polydipsia and polyuria.  ?Genitourinary:  Negative for dyspareunia, dysuria, flank pain, frequency and urgency.  ?Musculoskeletal:  Negative for arthralgias, back pain and myalgias.  ?Skin:  Negative for rash.  ?Allergic/Immunologic: Positive for environmental allergies.  ?Neurological:  Positive for headaches. Negative for dizziness and weakness.  ?Hematological:  Negative for adenopathy.  ?Psychiatric/Behavioral:  The patient is not nervous/anxious.   ? ? Objective  ?  ? ?Today's Vitals  ? 01/06/22 1519  ?BP: 112/71  ?Pulse: 83  ?Temp: 98.4 ?F (36.9 ?C)  ?SpO2: 94%  ?Weight: 251 lb 6.4 oz (114 kg)  ?Height: 5' 5.5" (1.664 m)  ? ?Body mass index is 41.2 kg/m?.  ? ?Physical Exam ?Vitals and nursing note reviewed.  ?Constitutional:   ?   Appearance: Normal appearance. She is well-developed. She is ill-appearing.  ?HENT:  ?   Head: Normocephalic and atraumatic.  ?   Right Ear: Tympanic membrane, ear canal and external ear normal.  ?   Left Ear: Tympanic membrane, ear canal and external ear normal.  ?   Nose: Congestion present.  ?   Right Sinus: Frontal sinus tenderness present.  ?   Left Sinus: Frontal sinus tenderness present.  ?  Mouth/Throat:  ?   Mouth: Mucous membranes are moist.  ?   Pharynx: Oropharynx is clear.  ?Eyes:  ?   Extraocular Movements: Extraocular movements intact.  ?   Conjunctiva/sclera: Conjunctivae normal.  ?   Pupils: Pupils are equal, round, and reactive to light.  ?Cardiovascular:  ?   Rate and Rhythm: Normal rate and regular rhythm.  ?   Pulses: Normal pulses.  ?   Heart sounds: Normal heart sounds.   ?Pulmonary:  ?   Effort: Pulmonary effort is normal.  ?   Breath sounds: Wheezing present.  ?   Comments: Dry, harsh, non productive cough noted.  ?Abdominal:  ?   Palpations: Abdomen is soft.  ?Musculoskeletal:     ?   General: Normal range of motion.  ?   Cervical back: Normal range of motion and neck supple.  ?Lymphadenopathy:  ?   Cervical: No cervical adenopathy.  ?Skin: ?   General: Skin is warm and dry.  ?   Capillary Refill: Capillary refill takes less than 2 seconds.  ?Neurological:  ?   General: No focal deficit present.  ?   Mental Status: She is alert and oriented to person, place, and time.  ?Psychiatric:     ?   Mood and Affect: Mood normal.     ?   Behavior: Behavior normal.     ?   Thought Content: Thought content normal.     ?   Judgment: Judgment normal.  ?  ? ? Assessment & Plan  ?  ?1. Moderate persistent asthma with acute exacerbation ?Start doxycycline 100mg  twice daily for seven days. Add medrol taper. Take as directed for 6 days. Use rescue inhaler as needed and as prescribed. Add albuterol nebulizer solution. Use one vial up to four times daily as needed for shortness of breath and wheezing. Consider chest x-ray if no improvement in next few days.  ?- predniSONE (STERAPRED UNI-PAK 21 TAB) 10 MG (21) TBPK tablet; 6 day taper - take by mouth as directed for 6 days  Dispense: 21 tablet; Refill: 0 ?- doxycycline (VIBRA-TABS) 100 MG tablet; Take 1 tablet (100 mg total) by mouth 2 (two) times daily.  Dispense: 14 tablet; Refill: 0 ?- albuterol (ACCUNEB) 1.25 MG/3ML nebulizer solution; Take 3 mLs (1.25 mg total) by nebulization every 6 (six) hours as needed for wheezing.  Dispense: 75 mL; Refill: 12  ? ?Return in about 4 months (around 05/08/2022) for asthma - please get progress note and x-ray from urgent care .  ?   ? ? ? ?05/10/2022, NP  ?[facility] ?[phone] (phone) ?[phone] (fax) ? ?[facility] ?

## 2022-01-17 DIAGNOSIS — J4541 Moderate persistent asthma with (acute) exacerbation: Secondary | ICD-10-CM | POA: Insufficient documentation

## 2022-05-04 DIAGNOSIS — M25562 Pain in left knee: Secondary | ICD-10-CM | POA: Diagnosis not present

## 2022-05-04 DIAGNOSIS — M25561 Pain in right knee: Secondary | ICD-10-CM | POA: Diagnosis not present

## 2022-05-05 ENCOUNTER — Ambulatory Visit: Payer: PPO | Admitting: Nurse Practitioner

## 2022-09-15 DIAGNOSIS — R0989 Other specified symptoms and signs involving the circulatory and respiratory systems: Secondary | ICD-10-CM | POA: Diagnosis not present

## 2022-09-15 DIAGNOSIS — R051 Acute cough: Secondary | ICD-10-CM | POA: Diagnosis not present

## 2022-11-29 IMAGING — CT CT HEAD W/O CM
2 of 4 series · 12 of 47 positions shown, 15 images · non-contrast
Comparison: None.

CLINICAL DATA: Fall

EXAM:
CT HEAD WITHOUT CONTRAST
TECHNIQUE: Contiguous axial images were obtained from the base of the skull
through the vertex without intravenous contrast.

[Series 5: coronal soft tissue · coronal · 0.32mm/px · 3 of 84 slices shown]
[im 28/84  brain]
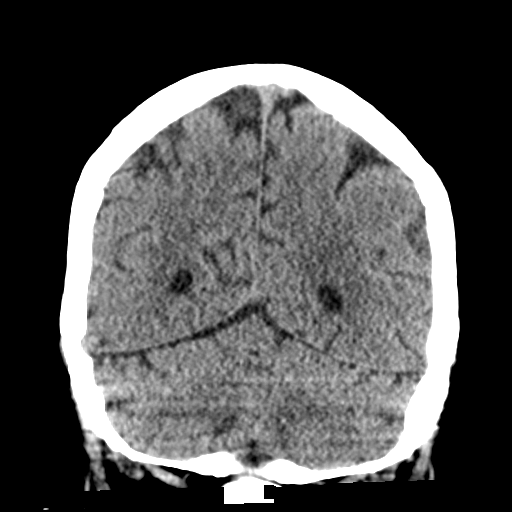
[im 37/84  brain]
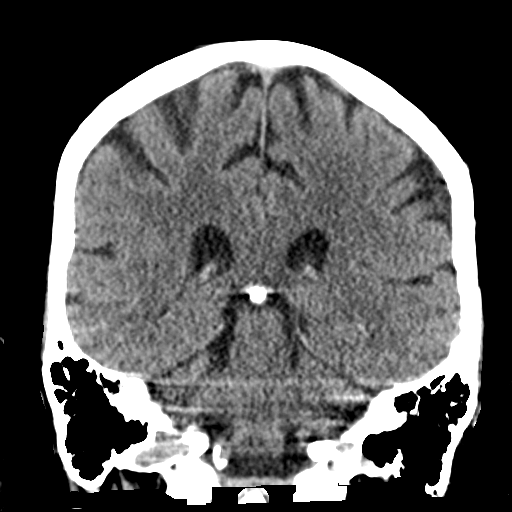
[im 47/84  brain]
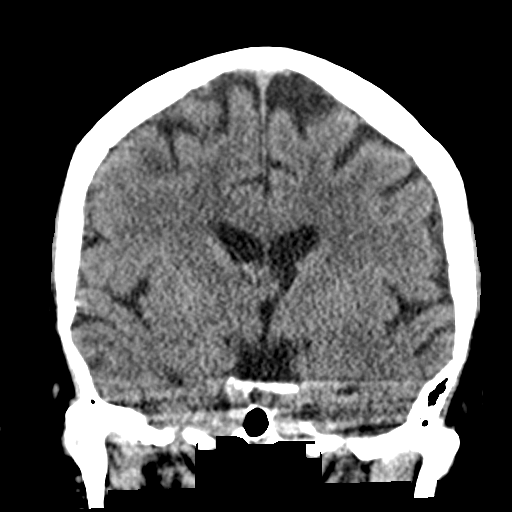

[Series 7: true axial · axial · 0.36mm/px · z∈[+1494,+1636]mm · 9 of 54 slices shown, 12 images]
[im 3/54  brain]
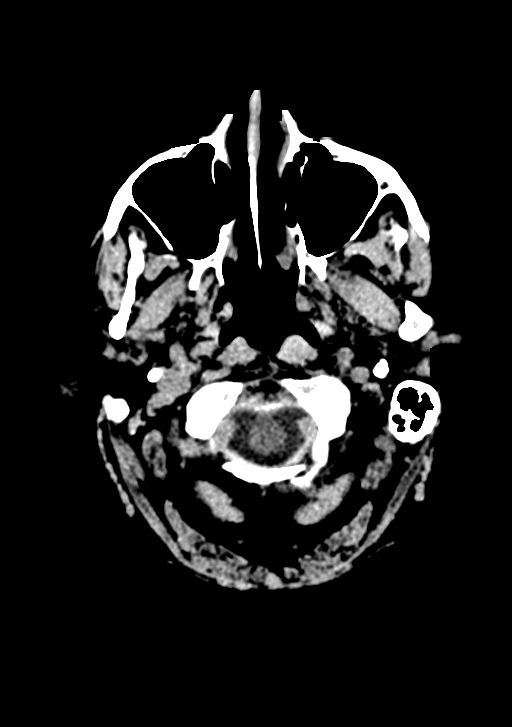
[im 3/54  bone]
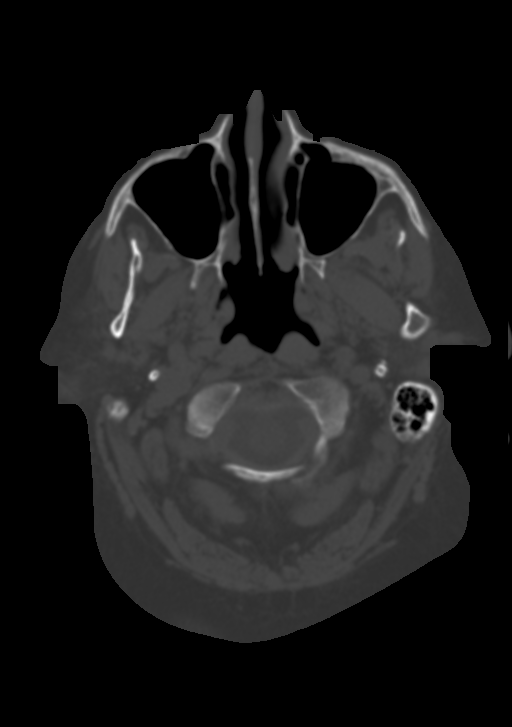
[im 9/54  brain]
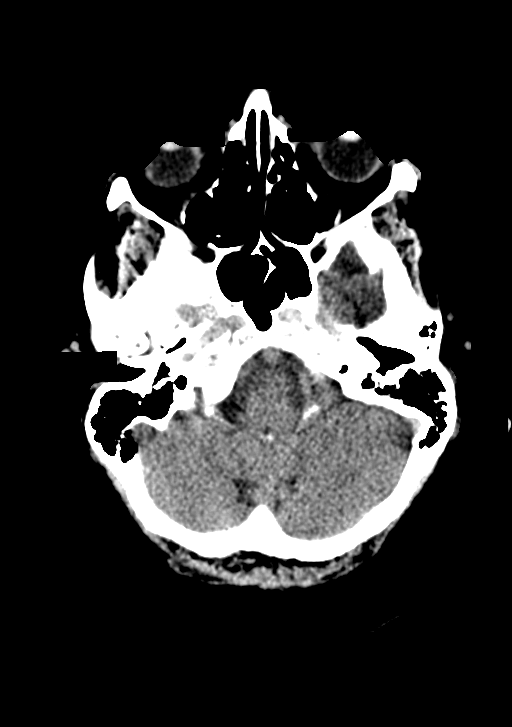
[im 15/54  brain]
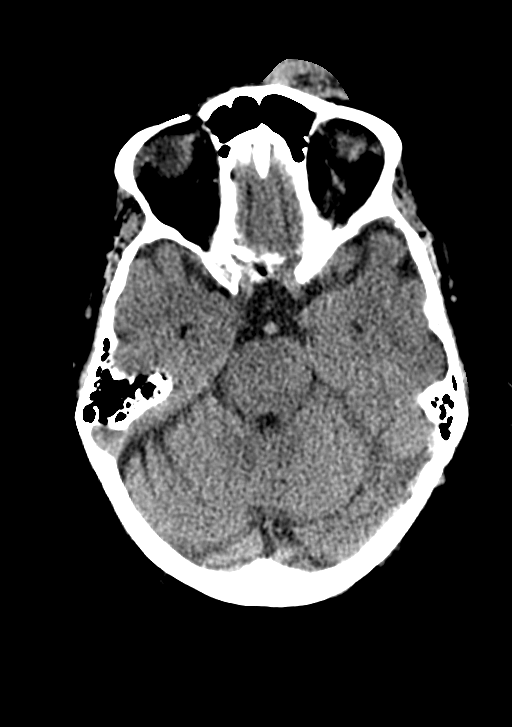
[im 21/54  brain]
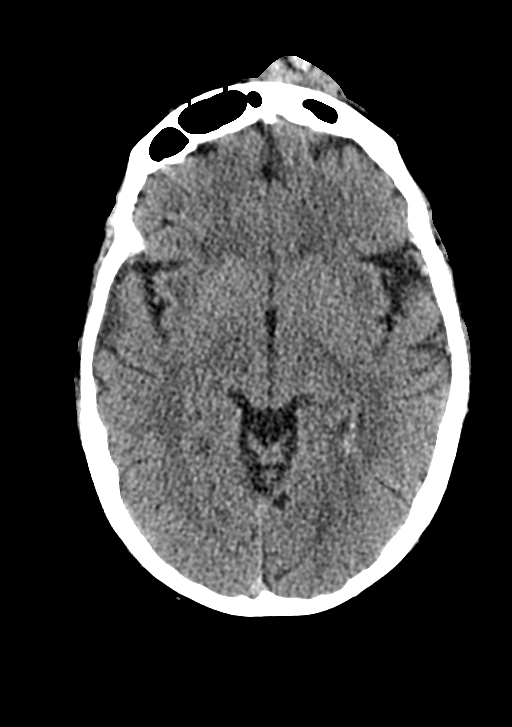
[im 27/54  brain]
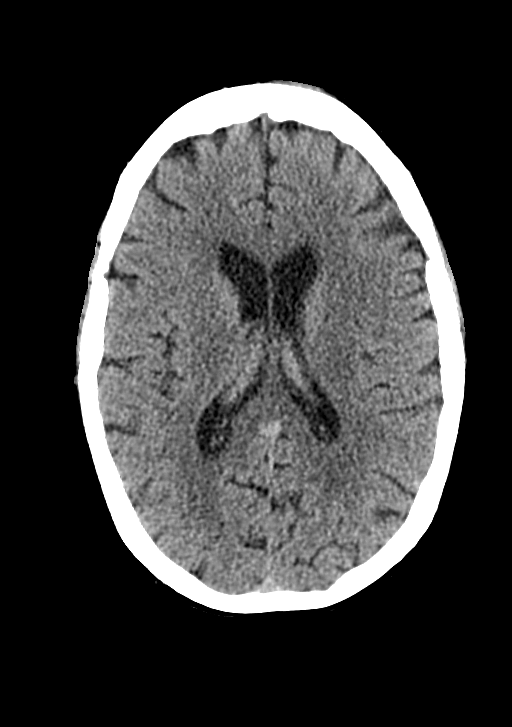
[im 27/54  bone]
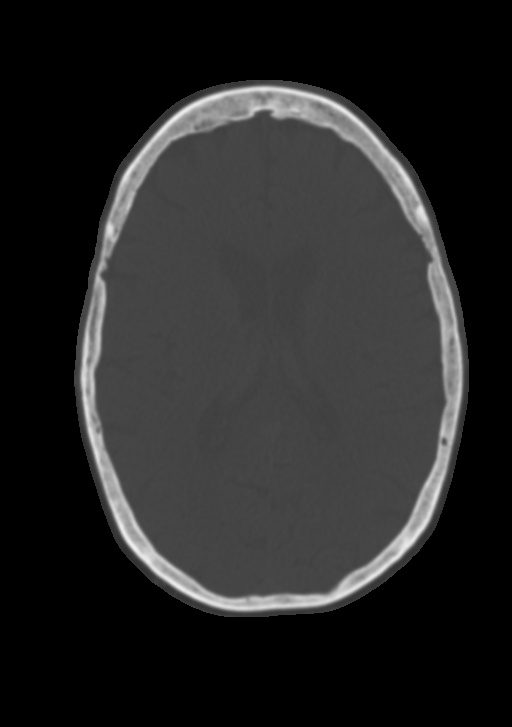
[im 33/54  brain]
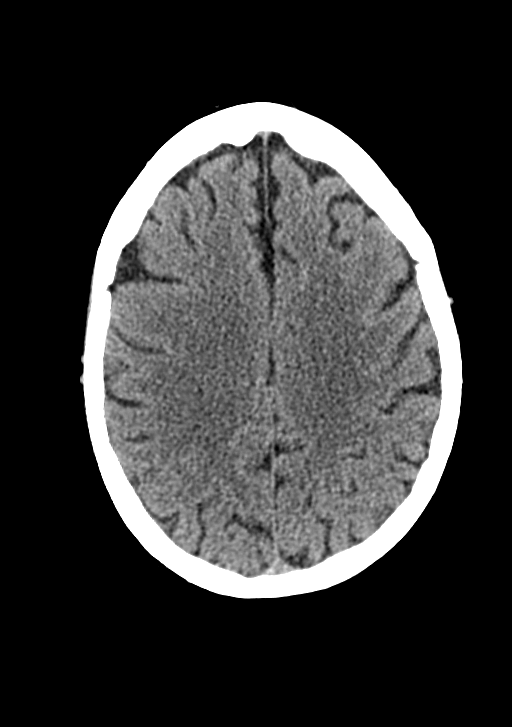
[im 39/54  brain]
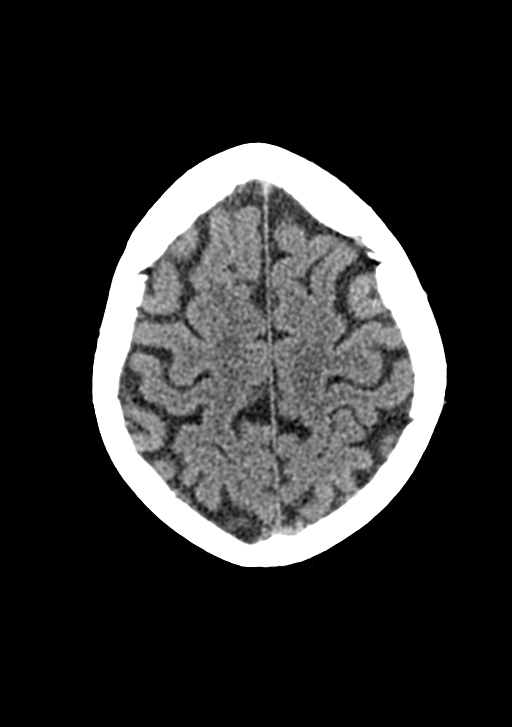
[im 45/54  brain]
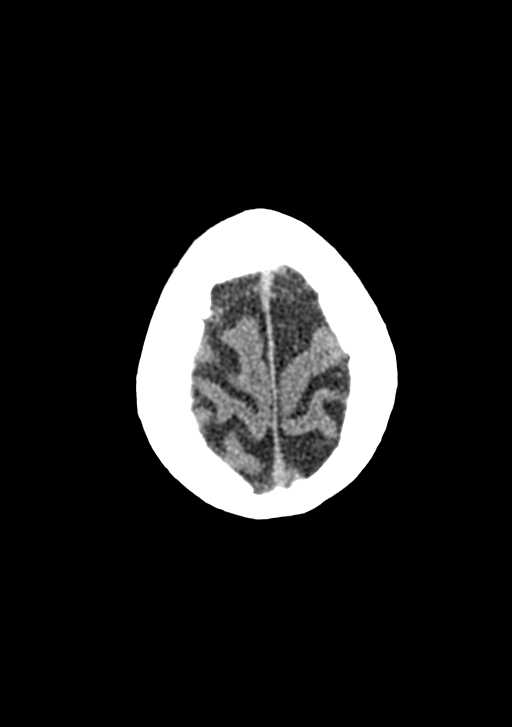
[im 51/54  brain]
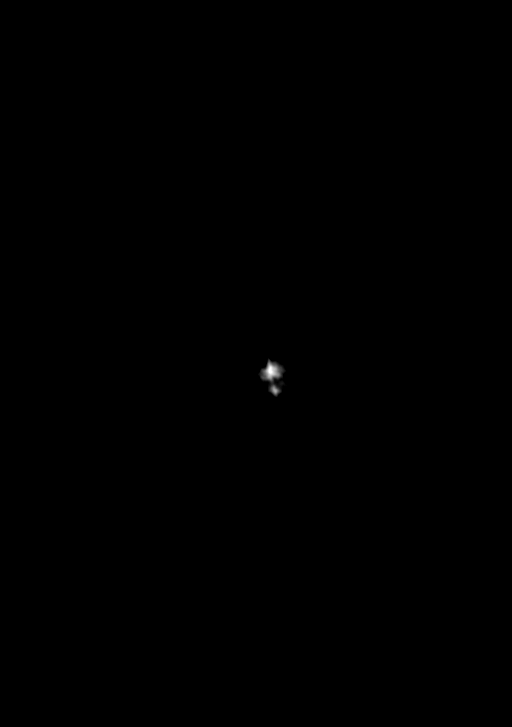
[im 51/54  bone]
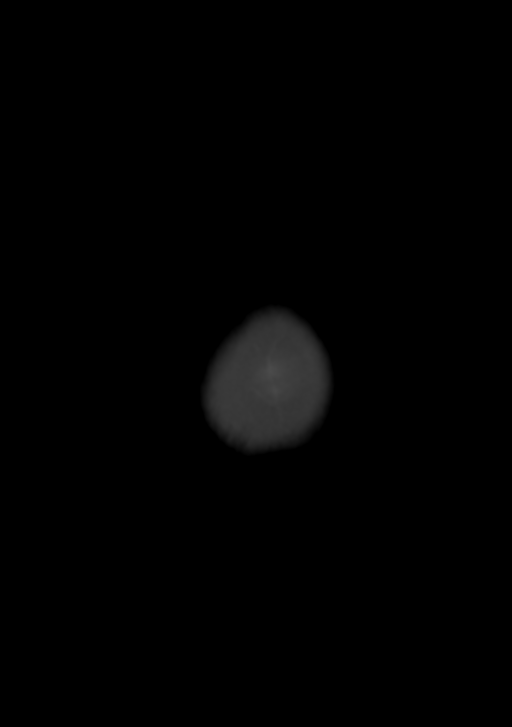

[12 of 47 positions shown; findings below may reference images not displayed]

FINDINGS: Brain: There is no mass, hemorrhage or extra-axial collection. The
size and configuration of the ventricles and extra-axial CSF spaces
are normal. The brain parenchyma is normal, without acute or chronic
infarction.

Vascular: No abnormal hyperdensity of the major intracranial
arteries or dural venous sinuses. No intracranial atherosclerosis.

Skull: Large left frontal scalp hematoma.  No skull fracture.

Sinuses/Orbits: No fluid levels or advanced mucosal thickening of
the visualized paranasal sinuses. No mastoid or middle ear effusion.
The orbits are normal.
IMPRESSION: 1. No acute intracranial abnormality.
2. Large left frontal scalp hematoma without skull fracture.

## 2023-02-15 ENCOUNTER — Telehealth: Payer: Self-pay

## 2023-02-15 NOTE — Telephone Encounter (Signed)
Called patient to schedule Medicare Annual Wellness Visit (AWV). Unable to reach patient.  Last date of AWV: 11/16/21  Please schedule an appointment at any time on Annual Wellness Schedule.

## 2023-02-16 ENCOUNTER — Encounter: Payer: Self-pay | Admitting: Nurse Practitioner

## 2023-02-16 ENCOUNTER — Ambulatory Visit (INDEPENDENT_AMBULATORY_CARE_PROVIDER_SITE_OTHER): Payer: PPO | Admitting: Nurse Practitioner

## 2023-02-16 VITALS — BP 139/83 | HR 76 | Ht 65.5 in | Wt 261.1 lb

## 2023-02-16 DIAGNOSIS — Z6841 Body Mass Index (BMI) 40.0 and over, adult: Secondary | ICD-10-CM

## 2023-02-16 DIAGNOSIS — M170 Bilateral primary osteoarthritis of knee: Secondary | ICD-10-CM | POA: Diagnosis not present

## 2023-02-16 DIAGNOSIS — J4541 Moderate persistent asthma with (acute) exacerbation: Secondary | ICD-10-CM

## 2023-02-16 MED ORDER — ALBUTEROL SULFATE HFA 108 (90 BASE) MCG/ACT IN AERS: 2.0000 | INHALATION_SPRAY | Freq: Four times a day (QID) | RESPIRATORY_TRACT | 5 refills | Status: DC | PRN

## 2023-02-16 MED ORDER — CELECOXIB 100 MG PO CAPS: 100.0000 mg | ORAL_CAPSULE | Freq: Two times a day (BID) | ORAL | 1 refills | Status: DC | PRN

## 2023-02-16 MED ORDER — ALBUTEROL SULFATE 1.25 MG/3ML IN NEBU: 1.0000 | INHALATION_SOLUTION | Freq: Four times a day (QID) | RESPIRATORY_TRACT | 12 refills | Status: DC | PRN

## 2023-02-16 MED ORDER — MOMETASONE FURO-FORMOTEROL FUM 100-5 MCG/ACT IN AERO: 2.0000 | INHALATION_SPRAY | Freq: Two times a day (BID) | RESPIRATORY_TRACT | 5 refills | Status: DC

## 2023-02-16 MED ORDER — PREDNISONE 10 MG (21) PO TBPK: ORAL_TABLET | ORAL | 0 refills | Status: DC

## 2023-02-16 NOTE — Progress Notes (Signed)
Established patient visit   Patient: Bethany Wolfe   DOB: 12-Dec-1940   82 y.o. Female  MRN: [id number] Visit Date: 02/16/2023   Chief Complaint  Patient presents with   Knee Pain   Subjective    HPI  Follow up  -asthma  -last visit was over 1 year ago  -blood pressure slightly elevated at start of visit.  -due for AWV -due for routine, fasting albs  -bilateral knee pain  -has been problem for some time, however has been bothering him more recently.  -She denies chest pain, chest pressure, or shortness of breath. She denies headaches or visual disturbances. She denies abdominal pain, nausea, vomiting, or changes in bowel or bladder habits.    Medications: Outpatient Medications Prior to Visit  Medication Sig   doxycycline (VIBRA-TABS) 100 MG tablet Take 1 tablet (100 mg total) by mouth 2 (two) times daily. (Patient not taking: Reported on 03/03/2023)   [DISCONTINUED] albuterol (ACCUNEB) 1.25 MG/3ML nebulizer solution Take 3 mLs (1.25 mg total) by nebulization every 6 (six) hours as needed for wheezing.   [DISCONTINUED] albuterol (VENTOLIN HFA) 108 (90 Base) MCG/ACT inhaler INHALE 1 TO 2 PUFFS BY MOUTH EVERY 4 TO 6 HOURS AS NEEDED   [DISCONTINUED] predniSONE (STERAPRED UNI-PAK 21 TAB) 10 MG (21) TBPK tablet 6 day taper - take by mouth as directed for 6 days   No facility-administered medications prior to visit.    Review of Systems See HPI       Objective     Today's Vitals   02/16/23 1341 02/16/23 1431  BP: (Abnormal) 145/84 139/83  Pulse: 76   SpO2: 98%   Weight: 261 lb 1.9 oz (118.4 kg)   Height: 5' 5.5" (1.664 m)    Body mass index is 42.79 kg/m.  BP Readings from Last 3 Encounters:  02/16/23 139/83  01/06/22 112/71  11/18/21 130/76    Wt Readings from Last 3 Encounters:  03/03/23 261 lb (118.4 kg)  02/16/23 261 lb 1.9 oz (118.4 kg)  01/06/22 251 lb 6.4 oz (114 kg)    Physical Exam Vitals and nursing note reviewed.  Constitutional:      Appearance:  Normal appearance. She is well-developed. She is obese.  HENT:     Head: Normocephalic and atraumatic.     Nose: Nose normal.     Mouth/Throat:     Mouth: Mucous membranes are moist.     Pharynx: Oropharynx is clear.  Eyes:     Extraocular Movements: Extraocular movements intact.     Conjunctiva/sclera: Conjunctivae normal.     Pupils: Pupils are equal, round, and reactive to light.  Neck:     Vascular: No carotid bruit.  Cardiovascular:     Rate and Rhythm: Normal rate and regular rhythm.     Pulses: Normal pulses.     Heart sounds: Normal heart sounds.  Pulmonary:     Effort: Pulmonary effort is normal.     Breath sounds: Normal breath sounds.  Abdominal:     Palpations: Abdomen is soft.  Musculoskeletal:     Cervical back: Normal range of motion and neck supple.     Right knee: Swelling, bony tenderness and crepitus present. Decreased range of motion. Tenderness present.     Left knee: Swelling, bony tenderness and crepitus present. Decreased range of motion. Tenderness present.  Lymphadenopathy:     Cervical: No cervical adenopathy.  Skin:    General: Skin is warm and dry.     Capillary Refill: Capillary refill  takes less than 2 seconds.  Neurological:     General: No focal deficit present.     Mental Status: She is alert and oriented to person, place, and time.  Psychiatric:        Mood and Affect: Mood normal.        Behavior: Behavior normal.        Thought Content: Thought content normal.        Judgment: Judgment normal.      Assessment & Plan    Primary osteoarthritis of knees, bilateral Assessment & Plan: -trial celebrex 100 mg twice daily as needed for pain/inflammation.  -add prednisone taper - take as directed for 6 days  -consider referral to orthopedics as indicated    Orders: -     Celecoxib; Take 1 capsule (100 mg total) by mouth 2 (two) times daily as needed. (Patient not taking: Reported on 03/03/2023)  Dispense: 60 capsule; Refill: 1 -      predniSONE; 6 day taper - take by mouth as directed for 6 days (Patient not taking: Reported on 03/03/2023)  Dispense: 21 tablet; Refill: 0  Moderate persistent asthma with acute exacerbation Assessment & Plan: Stable.  -use dulera twice daily for maintenance  -may use albuterol inhaler and/or nebulizer treatments as needed and as prescribed for wheezing and shortness of breath   Orders: -     Mometasone Furo-Formoterol Fum; Inhale 2 puffs into the lungs 2 (two) times daily. (Patient not taking: Reported on 03/03/2023)  Dispense: 13 g; Refill: 5 -     Albuterol Sulfate; Take 3 mLs (1.25 mg total) by nebulization every 6 (six) hours as needed for wheezing.  Dispense: 75 mL; Refill: 12 -     Albuterol Sulfate HFA; Inhale 2 puffs into the lungs every 6 (six) hours as needed for wheezing or shortness of breath.  Dispense: 1 each; Refill: 5  Class 3 severe obesity due to excess calories with serious comorbidity and body mass index (BMI) of 40.0 to 44.9 in adult [facility]) Assessment & Plan: Patient with asthma and osteoarthritis  -Discussed lowering calorie intake to 1500 calories per day and incorporating exercise into daily routine to help lose weight.       Return in about 6 months (around 08/18/2023) for asthma, needs to have MWV scheduled. they tried to scheudle televist .         Carlean Jews, NP  [facility] [phone] (phone) [phone] (fax)  [facility]

## 2023-02-28 ENCOUNTER — Telehealth: Payer: Self-pay | Admitting: *Deleted

## 2023-02-28 NOTE — Telephone Encounter (Signed)
INformed pt that her AWV was by phone and not in person so she did not need to reschedule. Lasandra Batley Zimmerman Rumple, CMA

## 2023-03-03 ENCOUNTER — Ambulatory Visit (INDEPENDENT_AMBULATORY_CARE_PROVIDER_SITE_OTHER): Payer: PPO

## 2023-03-03 VITALS — Ht 65.5 in | Wt 261.0 lb

## 2023-03-03 DIAGNOSIS — Z0000 Encounter for general adult medical examination without abnormal findings: Secondary | ICD-10-CM | POA: Diagnosis not present

## 2023-03-03 NOTE — Patient Instructions (Addendum)
Ms. Bethany Wolfe , Thank you for taking time to come for your Medicare Wellness Visit. I appreciate your ongoing commitment to your health goals. Please review the following plan we discussed and let me know if I can assist you in the future.   These are the goals we discussed:  Goals       Lose Weight (pt-stated)        This is a list of the screening recommended for you and due dates:  Health Maintenance  Topic Date Due   DTaP/Tdap/Td vaccine (1 - Tdap) Never done   COVID-19 Vaccine (3 - 2023-24 season) 03/19/2023*   Zoster (Shingles) Vaccine (1 of 2) 06/03/2023*   Flu Shot  05/26/2023   Pneumonia Vaccine  Completed   DEXA scan (bone density measurement)  Completed   HPV Vaccine  Aged Out  *Topic was postponed. The date shown is not the original due date.    Advanced directives: Please bring a copy of your health care power of attorney and living will to the office to be added to your chart at your convenience.   Conditions/risks identified: None  Next appointment: Follow up in one year for your annual wellness visit    Preventive Care 65 Years and Older, Female Preventive care refers to lifestyle choices and visits with your health care provider that can promote health and wellness. What does preventive care include? A yearly physical exam. This is also called an annual well check. Dental exams once or twice a year. Routine eye exams. Ask your health care provider how often you should have your eyes checked. Personal lifestyle choices, including: Daily care of your teeth and gums. Regular physical activity. Eating a healthy diet. Avoiding tobacco and drug use. Limiting alcohol use. Practicing safe sex. Taking low-dose aspirin every day. Taking vitamin and mineral supplements as recommended by your health care provider. What happens during an annual well check? The services and screenings done by your health care provider during your annual well check will depend on your  age, overall health, lifestyle risk factors, and family history of disease. Counseling  Your health care provider may ask you questions about your: Alcohol use. Tobacco use. Drug use. Emotional well-being. Home and relationship well-being. Sexual activity. Eating habits. History of falls. Memory and ability to understand (cognition). Work and work Astronomer. Reproductive health. Screening  You may have the following tests or measurements: Height, weight, and BMI. Blood pressure. Lipid and cholesterol levels. These may be checked every 5 years, or more frequently if you are over 17 years old. Skin check. Lung cancer screening. You may have this screening every year starting at age 45 if you have a 30-pack-year history of smoking and currently smoke or have quit within the past 15 years. Fecal occult blood test (FOBT) of the stool. You may have this test every year starting at age 41. Flexible sigmoidoscopy or colonoscopy. You may have a sigmoidoscopy every 5 years or a colonoscopy every 10 years starting at age 32. Hepatitis C blood test. Hepatitis B blood test. Sexually transmitted disease (STD) testing. Diabetes screening. This is done by checking your blood sugar (glucose) after you have not eaten for a while (fasting). You may have this done every 1-3 years. Bone density scan. This is done to screen for osteoporosis. You may have this done starting at age 80. Mammogram. This may be done every 1-2 years. Talk to your health care provider about how often you should have regular mammograms. Talk with your  health care provider about your test results, treatment options, and if necessary, the need for more tests. Vaccines  Your health care provider may recommend certain vaccines, such as: Influenza vaccine. This is recommended every year. Tetanus, diphtheria, and acellular pertussis (Tdap, Td) vaccine. You may need a Td booster every 10 years. Zoster vaccine. You may need this after  age 44. Pneumococcal 13-valent conjugate (PCV13) vaccine. One dose is recommended after age 40. Pneumococcal polysaccharide (PPSV23) vaccine. One dose is recommended after age 107. Talk to your health care provider about which screenings and vaccines you need and how often you need them. This information is not intended to replace advice given to you by your health care provider. Make sure you discuss any questions you have with your health care provider. Document Released: 11/07/2015 Document Revised: 06/30/2016 Document Reviewed: 08/12/2015 Elsevier Interactive Patient Education  2017 [place] Prevention in the Home Falls can cause injuries. They can happen to people of all ages. There are many things you can do to make your home safe and to help prevent falls. What can I do on the outside of my home? Regularly fix the edges of walkways and driveways and fix any cracks. Remove anything that might make you trip as you walk through a door, such as a raised step or threshold. Trim any bushes or trees on the path to your home. Use bright outdoor lighting. Clear any walking paths of anything that might make someone trip, such as rocks or tools. Regularly check to see if handrails are loose or broken. Make sure that both sides of any steps have handrails. Any raised decks and porches should have guardrails on the edges. Have any leaves, snow, or ice cleared regularly. Use sand or salt on walking paths during winter. Clean up any spills in your garage right away. This includes oil or grease spills. What can I do in the bathroom? Use night lights. Install grab bars by the toilet and in the tub and shower. Do not use towel bars as grab bars. Use non-skid mats or decals in the tub or shower. If you need to sit down in the shower, use a plastic, non-slip stool. Keep the floor dry. Clean up any water that spills on the floor as soon as it happens. Remove soap buildup in the tub or shower  regularly. Attach bath mats securely with double-sided non-slip rug tape. Do not have throw rugs and other things on the floor that can make you trip. What can I do in the bedroom? Use night lights. Make sure that you have a light by your bed that is easy to reach. Do not use any sheets or blankets that are too big for your bed. They should not hang down onto the floor. Have a firm chair that has side arms. You can use this for support while you get dressed. Do not have throw rugs and other things on the floor that can make you trip. What can I do in the kitchen? Clean up any spills right away. Avoid walking on wet floors. Keep items that you use a lot in easy-to-reach places. If you need to reach something above you, use a strong step stool that has a grab bar. Keep electrical cords out of the way. Do not use floor polish or wax that makes floors slippery. If you must use wax, use non-skid floor wax. Do not have throw rugs and other things on the floor that can make you trip. What  can I do with my stairs? Do not leave any items on the stairs. Make sure that there are handrails on both sides of the stairs and use them. Fix handrails that are broken or loose. Make sure that handrails are as long as the stairways. Check any carpeting to make sure that it is firmly attached to the stairs. Fix any carpet that is loose or worn. Avoid having throw rugs at the top or bottom of the stairs. If you do have throw rugs, attach them to the floor with carpet tape. Make sure that you have a light switch at the top of the stairs and the bottom of the stairs. If you do not have them, ask someone to add them for you. What else can I do to help prevent falls? Wear shoes that: Do not have high heels. Have rubber bottoms. Are comfortable and fit you well. Are closed at the toe. Do not wear sandals. If you use a stepladder: Make sure that it is fully opened. Do not climb a closed stepladder. Make sure that  both sides of the stepladder are locked into place. Ask someone to hold it for you, if possible. Clearly mark and make sure that you can see: Any grab bars or handrails. First and last steps. Where the edge of each step is. Use tools that help you move around (mobility aids) if they are needed. These include: Canes. Walkers. Scooters. Crutches. Turn on the lights when you go into a dark area. Replace any light bulbs as soon as they burn out. Set up your furniture so you have a clear path. Avoid moving your furniture around. If any of your floors are uneven, fix them. If there are any pets around you, be aware of where they are. Review your medicines with your doctor. Some medicines can make you feel dizzy. This can increase your chance of falling. Ask your doctor what other things that you can do to help prevent falls. This information is not intended to replace advice given to you by your health care provider. Make sure you discuss any questions you have with your health care provider. Document Released: 08/07/2009 Document Revised: 03/18/2016 Document Reviewed: 11/15/2014 Elsevier Interactive Patient Education  2017 [employer].

## 2023-03-03 NOTE — Progress Notes (Addendum)
Subjective:   Bethany Wolfe is a 82 y.o. female who presents for Medicare Annual (Subsequent) preventive examination.  Review of Systems    Virtual Visit via Telephone Note  I connected with  Bethany Wolfe on 03/03/23 at 10:00 AM EDT by telephone and verified that I am speaking with the correct person using two identifiers.  Location: Patient: Home Provider: Office Persons participating in the virtual visit: patient/Nurse Health Advisor   I discussed the limitations, risks, security and privacy concerns of performing an evaluation and management service by telephone and the availability of in person appointments. The patient expressed understanding and agreed to proceed.  Interactive audio and video telecommunications were attempted between this nurse and patient, however failed, due to patient having technical difficulties OR patient did not have access to video capability.  We continued and completed visit with audio only.  Some vital signs may be absent or patient reported.   Tillie Rung, LPN  Cardiac Risk Factors include: advanced age (>42men, >43 women);hypertension     Objective:    Today's Vitals   03/03/23 0958  Weight: 261 lb (118.4 kg)  Height: 5' 5.5" (1.664 m)   Body mass index is 42.77 kg/m.     03/03/2023   10:05 AM 01/31/2021   10:04 PM  Advanced Directives  Does Patient Have a Medical Advance Directive? Yes No  Type of Estate agent of [place];Living will   Copy of Healthcare Power of Attorney in Chart? No - copy requested   Would patient like information on creating a medical advance directive?  No - Patient declined    Current Medications (verified) Outpatient Encounter Medications as of 03/03/2023  Medication Sig   albuterol (ACCUNEB) 1.25 MG/3ML nebulizer solution Take 3 mLs (1.25 mg total) by nebulization every 6 (six) hours as needed for wheezing.   albuterol (VENTOLIN HFA) 108 (90 Base) MCG/ACT inhaler Inhale 2  puffs into the lungs every 6 (six) hours as needed for wheezing or shortness of breath.   celecoxib (CELEBREX) 100 MG capsule Take 1 capsule (100 mg total) by mouth 2 (two) times daily as needed. (Patient not taking: Reported on 03/03/2023)   doxycycline (VIBRA-TABS) 100 MG tablet Take 1 tablet (100 mg total) by mouth 2 (two) times daily. (Patient not taking: Reported on 03/03/2023)   mometasone-formoterol (DULERA) 100-5 MCG/ACT AERO Inhale 2 puffs into the lungs 2 (two) times daily. (Patient not taking: Reported on 03/03/2023)   predniSONE (STERAPRED UNI-PAK 21 TAB) 10 MG (21) TBPK tablet 6 day taper - take by mouth as directed for 6 days (Patient not taking: Reported on 03/03/2023)   No facility-administered encounter medications on file as of 03/03/2023.    Allergies (verified) Alendronate sodium, Codeine, Lovastatin, Penicillin g, Pravastatin, Tetanus toxoid, Penicillins, and Tetanus toxoids   History: Past Medical History:  Diagnosis Date   Asthma    Hypertension    History reviewed. No pertinent surgical history. History reviewed. No pertinent family history. Social History   Socioeconomic History   Marital status: Married    Spouse name: Not on file   Number of children: Not on file   Years of education: Not on file   Highest education level: Not on file  Occupational History   Not on file  Tobacco Use   Smoking status: Never   Smokeless tobacco: Never  Substance and Sexual Activity   Alcohol use: Never   Drug use: Never   Sexual activity: Not Currently  Other Topics Concern  Not on file  Social History Narrative   Not on file   Social Determinants of Health   Financial Resource Strain: Low Risk  (03/03/2023)   Overall Financial Resource Strain (CARDIA)    Difficulty of Paying Living Expenses: Not hard at all  Food Insecurity: No Food Insecurity (03/03/2023)   Hunger Vital Sign    Worried About Running Out of Food in the Last Year: Never true    Ran Out of Food in the Last  Year: Never true  Transportation Needs: No Transportation Needs (03/03/2023)   PRAPARE - Administrator, Civil Service (Medical): No    Lack of Transportation (Non-Medical): No  Physical Activity: Inactive (03/03/2023)   Exercise Vital Sign    Days of Exercise per Week: 0 days    Minutes of Exercise per Session: 0 min  Stress: No Stress Concern Present (03/03/2023)   [employer] of Occupational Health - Occupational Stress Questionnaire    Feeling of Stress : Not at all  Social Connections: Socially Integrated (03/03/2023)   Social Connection and Isolation Panel [NHANES]    Frequency of Communication with Friends and Family: More than three times a week    Frequency of Social Gatherings with Friends and Family: More than three times a week    Attends Religious Services: More than 4 times per year    Active Member of [employer] or Organizations: Yes    Attends Engineer, structural: More than 4 times per year    Marital Status: Married    Tobacco Counseling Counseling given: Not Answered   Clinical Intake:  Pre-visit preparation completed: No  Pain : No/denies pain     BMI - recorded: 42.77 Nutritional Status: BMI > 30  Obese Nutritional Risks: None Diabetes: No  How often do you need to have someone help you when you read instructions, pamphlets, or other written materials from your doctor or pharmacy?: 1 - Never  Diabetic?  No  Interpreter Needed?: No  Information entered by :: Theresa Mulligan LPN   Activities of Daily Living    03/03/2023   10:04 AM  In your present state of health, do you have any difficulty performing the following activities:  Hearing? 0  Vision? 0  Difficulty concentrating or making decisions? 0  Walking or climbing stairs? 0  Dressing or bathing? 0  Doing errands, shopping? 0  Preparing Food and eating ? N  Using the Toilet? N  In the past six months, have you accidently leaked urine? N  Do you have problems with loss of  bowel control? N  Managing your Medications? N  Managing your Finances? N  Housekeeping or managing your Housekeeping? N    Patient Care Team: Carlean Jews, NP as PCP - General (Family Medicine)  Indicate any recent Medical Services you may have received from other than [facility] providers in the past year (date may be approximate).     Assessment:   This is a routine wellness examination for Aveline.  Hearing/Vision screen Hearing Screening - Comments:: Denies hearing difficulties   Vision Screening - Comments:: Wears rx glasses - up to date with routine eye exams with  [facility]  Dietary issues and exercise activities discussed: Exercise limited by: None identified   Goals Addressed               This Visit's Progress     Lose Weight (pt-stated)         Depression Screen  03/03/2023   10:04 AM 02/16/2023    1:45 PM 01/06/2022    3:22 PM 11/18/2021   11:03 AM 09/22/2021   10:53 AM  PHQ 2/9 Scores  PHQ - 2 Score 0 0 0 0 0  PHQ- 9 Score 0 2 0 2 2    Fall Risk    03/03/2023   10:05 AM 02/16/2023    1:44 PM 11/18/2021   11:04 AM 09/22/2021   10:52 AM  Fall Risk   Falls in the past year? 1 1 1 1   Number falls in past yr: 0 0 0 0  Injury with Fall? 0 0 0 1  Risk for fall due to : No Fall Risks Other (Comment)  History of fall(s)  Follow up Falls prevention discussed Falls evaluation completed Falls evaluation completed Falls evaluation completed    FALL RISK PREVENTION PERTAINING TO THE HOME:  Any stairs in or around the home? Yes  If so, are there any without handrails? No  Home free of loose throw rugs in walkways, pet beds, electrical cords, etc? Yes  Adequate lighting in your home to reduce risk of falls? Yes   ASSISTIVE DEVICES UTILIZED TO PREVENT FALLS:  Life alert? No  Use of a cane, walker or w/c? Yes Grab bars in the bathroom? Yes  Shower chair or bench in shower? No  Elevated toilet seat or a handicapped toilet? Yes   TIMED UP AND  GO:  Was the test performed? No . Audio Visit   Cognitive Function:        03/03/2023   10:05 AM 11/18/2021   11:06 AM  6CIT Screen  What Year? 0 points 0 points  What month? 0 points 0 points  What time? 0 points 0 points  Count back from 20 0 points 0 points  Months in reverse 0 points 0 points  Repeat phrase 0 points 0 points  Total Score 0 points 0 points    Immunizations Immunization History  Administered Date(s) Administered   PFIZER(Purple Top)SARS-COV-2 Vaccination 11/15/2019, 12/01/2019   Pneumococcal Conjugate-13 05/17/2014   Pneumococcal Polysaccharide-23 07/29/2009     Flu Vaccine status: Up to date  Pneumococcal vaccine status: Up to date  Covid-19 vaccine status: Completed vaccines  Qualifies for Shingles Vaccine? Yes   Zostavax completed No   Shingrix Completed?: No.    Education has been provided regarding the importance of this vaccine. Patient has been advised to call insurance company to determine out of pocket expense if they have not yet received this vaccine. Advised may also receive vaccine at local pharmacy or Health Dept. Verbalized acceptance and understanding.  Screening Tests Health Maintenance  Topic Date Due   DTaP/Tdap/Td (1 - Tdap) Never done   COVID-19 Vaccine (3 - 2023-24 season) 03/19/2023 (Originally 06/25/2022)   Zoster Vaccines- Shingrix (1 of 2) 06/03/2023 (Originally [birth year]1991)   INFLUENZA VACCINE  05/26/2023   Pneumonia Vaccine 54+ Years old  Completed   DEXA SCAN  Completed   HPV VACCINES  Aged Out    Health Maintenance  Health Maintenance Due  Topic Date Due   DTaP/Tdap/Td (1 - Tdap) Never done    Colorectal cancer screening: No longer required.   Mammogram status: No longer required due to Age.    Lung Cancer Screening: (Low Dose CT Chest recommended if Age 60-80 years, 30 pack-year currently smoking OR have quit w/in 15years.) does not qualify.    Additional Screening:  Hepatitis C Screening: does not  qualify; Completed  Vision Screening: Recommended annual ophthalmology exams for early detection of glaucoma and other disorders of the eye. Is the patient up to date with their annual eye exam?  Yes  Who is the provider or what is the name of the office in which the patient attends annual eye exams? [facility] If pt is not established with a provider, would they like to be referred to a provider to establish care? No .   Dental Screening: Recommended annual dental exams for proper oral hygiene  Community Resource Referral / Chronic Care Management:  CRR required this visit?  No   CCM required this visit?  No      Plan:     I have personally reviewed and noted the following in the patient's chart:   Medical and social history Use of alcohol, tobacco or illicit drugs  Current medications and supplements including opioid prescriptions. Patient is not currently taking opioid prescriptions. Functional ability and status Nutritional status Physical activity Advanced directives List of other physicians Hospitalizations, surgeries, and ER visits in previous 12 months Vitals Screenings to include cognitive, depression, and falls Referrals and appointments  In addition, I have reviewed and discussed with patient certain preventive protocols, quality metrics, and best practice recommendations. A written personalized care plan for preventive services as well as general preventive health recommendations were provided to patient.     Tillie Rung, LPN   10/30/1094   Nurse Notes: None

## 2023-03-11 DIAGNOSIS — M170 Bilateral primary osteoarthritis of knee: Secondary | ICD-10-CM | POA: Insufficient documentation

## 2023-03-11 NOTE — Assessment & Plan Note (Signed)
-  trial celebrex 100 mg twice daily as needed for pain/inflammation.  -add prednisone taper - take as directed for 6 days  -consider referral to orthopedics as indicated

## 2023-03-11 NOTE — Assessment & Plan Note (Signed)
Patient with asthma and osteoarthritis  -Discussed lowering calorie intake to 1500 calories per day and incorporating exercise into daily routine to help lose weight.

## 2023-03-11 NOTE — Assessment & Plan Note (Signed)
Stable.  -use dulera twice daily for maintenance  -may use albuterol inhaler and/or nebulizer treatments as needed and as prescribed for wheezing and shortness of breath

## 2023-03-24 NOTE — Addendum Note (Signed)
Addended by: Vincent Gros on: 03/24/2023 08:42 AM   Modules accepted: Level of Service

## 2023-04-27 ENCOUNTER — Encounter: Payer: Self-pay | Admitting: Family Medicine

## 2023-04-27 NOTE — Progress Notes (Signed)
Received notice that [facility] inhaler was denied under Medicare part D.  Requirements for approval include diagnosis of asthma and trial and failure/contraindication/intolerance to [facility].

## 2023-06-26 DIAGNOSIS — S42309A Unspecified fracture of shaft of humerus, unspecified arm, initial encounter for closed fracture: Secondary | ICD-10-CM

## 2023-06-26 HISTORY — DX: Unspecified fracture of shaft of humerus, unspecified arm, initial encounter for closed fracture: S42.309A

## 2023-07-06 ENCOUNTER — Ambulatory Visit: Payer: PPO | Admitting: Family Medicine

## 2023-07-07 ENCOUNTER — Other Ambulatory Visit: Payer: PPO

## 2023-07-07 DIAGNOSIS — E0590 Thyrotoxicosis, unspecified without thyrotoxic crisis or storm: Secondary | ICD-10-CM

## 2023-07-07 DIAGNOSIS — R7301 Impaired fasting glucose: Secondary | ICD-10-CM | POA: Diagnosis not present

## 2023-07-07 DIAGNOSIS — I10 Essential (primary) hypertension: Secondary | ICD-10-CM | POA: Diagnosis not present

## 2023-07-07 DIAGNOSIS — E785 Hyperlipidemia, unspecified: Secondary | ICD-10-CM | POA: Diagnosis not present

## 2023-07-07 DIAGNOSIS — R5383 Other fatigue: Secondary | ICD-10-CM | POA: Diagnosis not present

## 2023-07-07 NOTE — Addendum Note (Signed)
Addended by: Tonny Bollman on: 07/07/2023 10:32 AM   Modules accepted: Orders

## 2023-07-08 ENCOUNTER — Encounter (HOSPITAL_COMMUNITY): Payer: Self-pay

## 2023-07-08 ENCOUNTER — Emergency Department (HOSPITAL_COMMUNITY): Payer: PPO

## 2023-07-08 ENCOUNTER — Other Ambulatory Visit: Payer: Self-pay

## 2023-07-08 ENCOUNTER — Encounter (HOSPITAL_BASED_OUTPATIENT_CLINIC_OR_DEPARTMENT_OTHER): Payer: Self-pay | Admitting: Orthopaedic Surgery

## 2023-07-08 ENCOUNTER — Emergency Department (HOSPITAL_COMMUNITY)
Admission: EM | Admit: 2023-07-08 | Discharge: 2023-07-08 | Disposition: A | Discharge status: Home / Self Care | Payer: PPO | Attending: Emergency Medicine | Admitting: Emergency Medicine

## 2023-07-08 DIAGNOSIS — R911 Solitary pulmonary nodule: Secondary | ICD-10-CM | POA: Diagnosis not present

## 2023-07-08 DIAGNOSIS — S42352A Displaced comminuted fracture of shaft of humerus, left arm, initial encounter for closed fracture: Secondary | ICD-10-CM | POA: Diagnosis not present

## 2023-07-08 DIAGNOSIS — S0003XA Contusion of scalp, initial encounter: Secondary | ICD-10-CM | POA: Diagnosis not present

## 2023-07-08 DIAGNOSIS — W228XXA Striking against or struck by other objects, initial encounter: Secondary | ICD-10-CM | POA: Insufficient documentation

## 2023-07-08 DIAGNOSIS — M19012 Primary osteoarthritis, left shoulder: Secondary | ICD-10-CM | POA: Diagnosis not present

## 2023-07-08 DIAGNOSIS — R0781 Pleurodynia: Secondary | ICD-10-CM | POA: Diagnosis not present

## 2023-07-08 DIAGNOSIS — M954 Acquired deformity of chest and rib: Secondary | ICD-10-CM | POA: Diagnosis not present

## 2023-07-08 DIAGNOSIS — K449 Diaphragmatic hernia without obstruction or gangrene: Secondary | ICD-10-CM | POA: Diagnosis not present

## 2023-07-08 DIAGNOSIS — S42402A Unspecified fracture of lower end of left humerus, initial encounter for closed fracture: Secondary | ICD-10-CM | POA: Diagnosis not present

## 2023-07-08 DIAGNOSIS — S2242XA Multiple fractures of ribs, left side, initial encounter for closed fracture: Secondary | ICD-10-CM | POA: Insufficient documentation

## 2023-07-08 DIAGNOSIS — M79641 Pain in right hand: Secondary | ICD-10-CM | POA: Diagnosis not present

## 2023-07-08 DIAGNOSIS — W19XXXA Unspecified fall, initial encounter: Secondary | ICD-10-CM | POA: Diagnosis not present

## 2023-07-08 DIAGNOSIS — J45909 Unspecified asthma, uncomplicated: Secondary | ICD-10-CM | POA: Insufficient documentation

## 2023-07-08 DIAGNOSIS — M19032 Primary osteoarthritis, left wrist: Secondary | ICD-10-CM | POA: Diagnosis not present

## 2023-07-08 DIAGNOSIS — M1811 Unilateral primary osteoarthritis of first carpometacarpal joint, right hand: Secondary | ICD-10-CM | POA: Diagnosis not present

## 2023-07-08 DIAGNOSIS — S299XXA Unspecified injury of thorax, initial encounter: Secondary | ICD-10-CM | POA: Diagnosis not present

## 2023-07-08 DIAGNOSIS — S0990XA Unspecified injury of head, initial encounter: Secondary | ICD-10-CM | POA: Diagnosis not present

## 2023-07-08 DIAGNOSIS — S43002A Unspecified subluxation of left shoulder joint, initial encounter: Secondary | ICD-10-CM | POA: Diagnosis not present

## 2023-07-08 DIAGNOSIS — I700 Atherosclerosis of aorta: Secondary | ICD-10-CM | POA: Diagnosis not present

## 2023-07-08 DIAGNOSIS — S42202A Unspecified fracture of upper end of left humerus, initial encounter for closed fracture: Secondary | ICD-10-CM | POA: Diagnosis not present

## 2023-07-08 DIAGNOSIS — M25512 Pain in left shoulder: Secondary | ICD-10-CM | POA: Diagnosis present

## 2023-07-08 DIAGNOSIS — S46012A Strain of muscle(s) and tendon(s) of the rotator cuff of left shoulder, initial encounter: Secondary | ICD-10-CM | POA: Insufficient documentation

## 2023-07-08 DIAGNOSIS — M1812 Unilateral primary osteoarthritis of first carpometacarpal joint, left hand: Secondary | ICD-10-CM | POA: Diagnosis not present

## 2023-07-08 DIAGNOSIS — I10 Essential (primary) hypertension: Secondary | ICD-10-CM | POA: Diagnosis not present

## 2023-07-08 DIAGNOSIS — M25532 Pain in left wrist: Secondary | ICD-10-CM | POA: Diagnosis not present

## 2023-07-08 DIAGNOSIS — M778 Other enthesopathies, not elsewhere classified: Secondary | ICD-10-CM | POA: Diagnosis not present

## 2023-07-08 DIAGNOSIS — S199XXA Unspecified injury of neck, initial encounter: Secondary | ICD-10-CM | POA: Diagnosis not present

## 2023-07-08 DIAGNOSIS — S4992XA Unspecified injury of left shoulder and upper arm, initial encounter: Secondary | ICD-10-CM | POA: Diagnosis not present

## 2023-07-08 LAB — LIPID PANEL
Chol/HDL Ratio: 4 ratio (ref 0.0–4.4)
Cholesterol, Total: 190 mg/dL (ref 100–199)
HDL: 48 mg/dL (ref >39)
LDL Chol Calc (NIH): 117 mg/dL — ABNORMAL HIGH (ref 0–99)
Triglycerides: 139 mg/dL (ref 0–149)
VLDL Cholesterol Cal: 25 mg/dL (ref 5–40)

## 2023-07-08 LAB — HEMOGLOBIN A1C
Est. average glucose Bld gHb Est-mCnc: 117 mg/dL
Hgb A1c MFr Bld: 5.7 % — ABNORMAL HIGH (ref 4.8–5.6)

## 2023-07-08 LAB — CBC WITH DIFFERENTIAL/PLATELET
Basophils Absolute: 0.1 10*3/uL (ref 0.0–0.2)
Basos: 1 %
EOS (ABSOLUTE): 0.2 10*3/uL (ref 0.0–0.4)
Eos: 4 %
Hematocrit: 48 % — ABNORMAL HIGH (ref 34.0–46.6)
Hemoglobin: 15 g/dL (ref 11.1–15.9)
Immature Grans (Abs): 0 10*3/uL (ref 0.0–0.1)
Immature Granulocytes: 0 %
Lymphocytes Absolute: 1.3 10*3/uL (ref 0.7–3.1)
Lymphs: 20 %
MCH: 26.9 pg (ref 26.6–33.0)
MCHC: 31.3 g/dL — ABNORMAL LOW (ref 31.5–35.7)
MCV: 86 fL (ref 79–97)
Monocytes Absolute: 0.4 10*3/uL (ref 0.1–0.9)
Monocytes: 6 %
Neutrophils Absolute: 4.5 10*3/uL (ref 1.4–7.0)
Neutrophils: 69 %
Platelets: 195 10*3/uL (ref 150–450)
RBC: 5.58 x10E6/uL — ABNORMAL HIGH (ref 3.77–5.28)
RDW: 13.8 % (ref 11.7–15.4)
WBC: 6.5 10*3/uL (ref 3.4–10.8)

## 2023-07-08 LAB — COMPREHENSIVE METABOLIC PANEL
ALT: 9 IU/L (ref 0–32)
AST: 12 IU/L (ref 0–40)
Albumin: 4.1 g/dL (ref 3.7–4.7)
Alkaline Phosphatase: 87 IU/L (ref 44–121)
BUN/Creatinine Ratio: 13 (ref 12–28)
BUN: 10 mg/dL (ref 8–27)
Bilirubin Total: 0.4 mg/dL (ref 0.0–1.2)
CO2: 23 mmol/L (ref 20–29)
Calcium: 9.4 mg/dL (ref 8.7–10.3)
Chloride: 105 mmol/L (ref 96–106)
Creatinine, Ser: 0.78 mg/dL (ref 0.57–1.00)
Globulin, Total: 2.4 g/dL (ref 1.5–4.5)
Glucose: 113 mg/dL — ABNORMAL HIGH (ref 70–99)
Potassium: 4.6 mmol/L (ref 3.5–5.2)
Sodium: 143 mmol/L (ref 134–144)
Total Protein: 6.5 g/dL (ref 6.0–8.5)
eGFR: 76 mL/min/{1.73_m2} (ref >59)

## 2023-07-08 LAB — TSH: TSH: 1.83 u[IU]/mL (ref 0.450–4.500)

## 2023-07-08 MED ORDER — OXYCODONE-ACETAMINOPHEN 5-325 MG PO TABS
1.0000 | ORAL_TABLET | Freq: Four times a day (QID) | ORAL | 0 refills | Status: DC | PRN
Start: 2023-07-08 — End: 2023-07-14

## 2023-07-08 MED ORDER — HYDROMORPHONE HCL 1 MG/ML IJ SOLN
1.0000 mg | Freq: Once | INTRAMUSCULAR | Status: AC
  Administered 2023-07-08: 1 mg via INTRAVENOUS
  Filled 2023-07-08: qty 1

## 2023-07-08 MED ORDER — BACITRACIN ZINC 500 UNIT/GM EX OINT
TOPICAL_OINTMENT | Freq: Once | CUTANEOUS | Status: AC
  Administered 2023-07-08: 1 via TOPICAL
  Filled 2023-07-08: qty 0.9

## 2023-07-08 MED ORDER — MORPHINE SULFATE (PF) 4 MG/ML IV SOLN
4.0000 mg | Freq: Once | INTRAVENOUS | Status: AC
  Administered 2023-07-08: 4 mg via INTRAVENOUS
  Filled 2023-07-08: qty 1

## 2023-07-08 MED ORDER — ONDANSETRON HCL 4 MG/2ML IJ SOLN
4.0000 mg | Freq: Once | INTRAMUSCULAR | Status: AC
  Administered 2023-07-08: 4 mg via INTRAVENOUS
  Filled 2023-07-08: qty 2

## 2023-07-08 MED ORDER — IBUPROFEN 600 MG PO TABS: 600.0000 mg | ORAL_TABLET | Freq: Four times a day (QID) | ORAL | 0 refills | Status: DC | PRN

## 2023-07-08 NOTE — Consult Note (Signed)
ORTHOPAEDIC CONSULTATION  REQUESTING PHYSICIAN: No att. providers found  Chief Complaint: Left arm pain   HPI: Bethany Wolfe is a 82 y.o. female with pmh significant for HTN, Asthma and Morbid Obesity (BMI 42) who presented to the ED after falling at home. 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 Left elbow XR demonstrated a severely comminuted and displaced supracondylar fracture of the distal humerus. Subsequent CT of the left elbow confirmed a severely comminuted and displaced intra articular fracture of the distal humerus. 3D CT of the left elbow has been ordered by Dr. Everardo Pacific and pending at time of consult. Orthopedics was consulted for recommendations and management.    Past Medical History:  Diagnosis Date   Asthma    Humerus fracture    left   Past Surgical History:  Procedure Laterality Date   ABDOMINAL HYSTERECTOMY     COLONOSCOPY     Social History   Socioeconomic History   Marital status: Married    Spouse name: Not on file   Number of children: Not on file   Years of education: Not on file   Highest education level: Not on file  Occupational History   Not on file  Tobacco Use   Smoking status: Former    Types: Cigarettes   Smokeless tobacco: Never  Substance and Sexual Activity   Alcohol use: Never   Drug use: Never   Sexual activity: Not Currently  Other Topics Concern   Not on file  Social History Narrative   Not on file   Social Determinants of Health   Financial Resource Strain: Low Risk  (03/03/2023)   Overall Financial Resource Strain (CARDIA)    Difficulty of Paying Living Expenses: Not hard at all  Food Insecurity: No Food Insecurity (03/03/2023)   Hunger Vital Sign    Worried About Running Out of Food in the Last Year: Never true    Ran Out of Food in the Last Year: Never  true  Transportation Needs: No Transportation Needs (03/03/2023)   PRAPARE - Administrator, Civil Service (Medical): No    Lack of Transportation (Non-Medical): No  Physical Activity: Inactive (03/03/2023)   Exercise Vital Sign    Days of Exercise per Week: 0 days    Minutes of Exercise per Session: 0 min  Stress: No Stress Concern Present (03/03/2023)   [employer] of Occupational Health - Occupational Stress Questionnaire    Feeling of Stress : Not at all  Social Connections: Socially Integrated (03/03/2023)   Social Connection and Isolation Panel [NHANES]    Frequency of Communication with Friends and Family: More than three times a week    Frequency of Social Gatherings with Friends and Family: More than three times a week    Attends Religious Services: More than 4 times per year    Active Member of [employer] or Organizations: Yes    Attends Engineer, structural: More than 4 times per year    Marital Status: Married   History reviewed. No pertinent family history. Allergies  Allergen Reactions   Alendronate Sodium     Other reaction(s): GI Upset (intolerance)   Codeine Nausea And Vomiting   Lovastatin Nausea Only   Penicillin G Hives    Big whelps   Pravastatin     Other reaction(s): Myalgias (intolerance)  Tetanus Toxoid Hives   Penicillins Rash   Tetanus Toxoids Rash   Prior to Admission medications   Medication Sig Start Date End Date Taking? Authorizing Provider  albuterol (ACCUNEB) 1.25 MG/3ML nebulizer solution Take 3 mLs (1.25 mg total) by nebulization every 6 (six) hours as needed for wheezing. 02/16/23  Yes Boscia, Heather E, NP  albuterol (VENTOLIN HFA) 108 (90 Base) MCG/ACT inhaler Inhale 2 puffs into the lungs every 6 (six) hours as needed for wheezing or shortness of breath. 02/16/23  Yes Boscia, Kathlynn Grate, NP  ibuprofen (ADVIL) 600 MG tablet Take 1 tablet (600 mg total) by mouth every 6 (six) hours as needed. 07/08/23  Yes Jacalyn Lefevre, MD   oxyCODONE-acetaminophen (PERCOCET/ROXICET) 5-325 MG tablet Take 1 tablet by mouth every 6 (six) hours as needed for severe pain. 07/08/23  Yes Jacalyn Lefevre, MD   CT Elbow Left Wo Contrast  Result Date: 07/08/2023 CLINICAL DATA:  Elbow trauma.  Distal humeral fracture. EXAM: CT OF THE UPPER LEFT EXTREMITY WITHOUT CONTRAST 3-DIMENSIONAL CT IMAGE RENDERING ON ACQUISITION WORKSTATION TECHNIQUE: Multidetector CT imaging of the left elbow was performed according to the standard protocol. 3-dimensional CT images were rendered by post-processing of the original CT data on an acquisition workstation. The 3-dimensional CT images were interpreted and findings were reported in the accompanying complete CT report for this study RADIATION DOSE REDUCTION: This exam was performed according to the departmental dose-optimization program which includes automated exposure control, adjustment of the mA and/or kV according to patient size and/or use of iterative reconstruction technique. COMPARISON:  Radiographs same date. FINDINGS: Bones/Joint/Cartilage There is a severely comminuted and displaced fracture of the distal humerus which has transverse and intercondylar components. The medial humeral epicondyle is displaced medially by up to 9 mm. Fracture extends into the articular surface of the trochlea. The radial head is located without evidence of acute fracture. Possible small fracture of the tip of the olecranon process. There is spurring of the coronoid process without other additional proximal humeral fracture. Small elbow joint effusion noted. Ligaments Suboptimally assessed by CT. Muscles and Tendons No focal muscular hematoma, atrophy or foreign body identified. Soft tissues Dorsal soft tissue swelling without focal fluid collection. No foreign body or soft tissue emphysema. IMPRESSION: 1. Severely comminuted and displaced intra-articular fracture of the distal humerus as described. 2. Possible small fracture of the tip  of the olecranon process. 3. No evidence of radial head fracture or dislocation. 4. Posterior soft tissue swelling without focal hematoma or foreign body. Electronically Signed   By: Carey Bullocks M.D.   On: 07/08/2023 14:05   CT 3D RECON AT SCANNER  Result Date: 07/08/2023 CLINICAL DATA:  Elbow trauma.  Distal humeral fracture. EXAM: CT OF THE UPPER LEFT EXTREMITY WITHOUT CONTRAST 3-DIMENSIONAL CT IMAGE RENDERING ON ACQUISITION WORKSTATION TECHNIQUE: Multidetector CT imaging of the left elbow was performed according to the standard protocol. 3-dimensional CT images were rendered by post-processing of the original CT data on an acquisition workstation. The 3-dimensional CT images were interpreted and findings were reported in the accompanying complete CT report for this study RADIATION DOSE REDUCTION: This exam was performed according to the departmental dose-optimization program which includes automated exposure control, adjustment of the mA and/or kV according to patient size and/or use of iterative reconstruction technique. COMPARISON:  Radiographs same date. FINDINGS: Bones/Joint/Cartilage There is a severely comminuted and displaced fracture of the distal humerus which has transverse and intercondylar components. The medial humeral epicondyle is displaced medially by up to 9  mm. Fracture extends into the articular surface of the trochlea. The radial head is located without evidence of acute fracture. Possible small fracture of the tip of the olecranon process. There is spurring of the coronoid process without other additional proximal humeral fracture. Small elbow joint effusion noted. Ligaments Suboptimally assessed by CT. Muscles and Tendons No focal muscular hematoma, atrophy or foreign body identified. Soft tissues Dorsal soft tissue swelling without focal fluid collection. No foreign body or soft tissue emphysema. IMPRESSION: 1. Severely comminuted and displaced intra-articular fracture of the distal  humerus as described. 2. Possible small fracture of the tip of the olecranon process. 3. No evidence of radial head fracture or dislocation. 4. Posterior soft tissue swelling without focal hematoma or foreign body. Electronically Signed   By: Carey Bullocks M.D.   On: 07/08/2023 14:05   CT Shoulder Left Wo Contrast  Result Date: 07/08/2023 CLINICAL DATA:  Shoulder trauma, instability or dislocation suspected. EXAM: CT OF THE UPPER LEFT EXTREMITY WITHOUT CONTRAST TECHNIQUE: Multidetector CT imaging of the left shoulder was performed according to the standard protocol. RADIATION DOSE REDUCTION: This exam was performed according to the departmental dose-optimization program which includes automated exposure control, adjustment of the mA and/or kV according to patient size and/or use of iterative reconstruction technique. COMPARISON:  Radiographs same date. FINDINGS: Bones/Joint/Cartilage No evidence of acute fracture or dislocation at the shoulder. Mild glenohumeral degenerative changes with joint space narrowing and osteophytes. No significant shoulder joint effusion. 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 The humeral head is high-riding with marked narrowing of the subacromial space. There is fragmented spurring of the acromion. Mild acromioclavicular degenerative changes are noted. Ligaments Suboptimally assessed by CT. Muscles and Tendons Obliteration of the subacromial space consistent with a chronic rotator cuff tear. Prominent calcification within the supraspinatus tendon. Mild supraspinatus and moderate infraspinatus muscular atrophy. No evidence of intramuscular hematoma. Soft tissues Prominent fluid anteriorly in the subcoracoid bursa. No other periarticular fluid collections are identified. Incidentally noted is heterogeneous soft tissue between the tip of the left scapula and the chest wall, consistent with elastofibroma dorsi. 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  IMPRESSION: 1. No evidence of acute fracture or dislocation at the left shoulder. 2. 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 Mild supraspinatus and moderate infraspinatus muscular atrophy. 3. Mild glenohumeral and acromioclavicular degenerative changes. 4. Prominent fluid anteriorly in the subcoracoid bursa. 5. Incidental elastofibroma dorsi. 6.  Aortic Atherosclerosis (ICD10-I70.0). Electronically Signed   By: Carey Bullocks M.D.   On: 07/08/2023 13:47   CT Chest Wo Contrast  Result Date: 07/08/2023 CLINICAL DATA:  Blunt chest trauma. EXAM: CT CHEST WITHOUT CONTRAST TECHNIQUE: Multidetector CT imaging of the chest was performed following the standard protocol without IV contrast. RADIATION DOSE REDUCTION: This exam was performed according to the departmental dose-optimization program which includes automated exposure control, adjustment of the mA and/or kV according to patient size and/or use of iterative reconstruction technique. COMPARISON:  CT 04/20/2013 FINDINGS: Cardiovascular: Small pericardial effusion. Heart is nonenlarged. Coronary artery calcifications are seen. On this non IV contrast exam the thoracic aorta has a normal course and caliber. Mild scattered atherosclerotic plaque overall. More significant plaque along the proximal left subclavian artery. Mediastinum/Nodes: On this non IV contrast exam there is no specific abnormal lymph node enlargement seen in the axillary regions, hilum or mediastinum. Small hiatal hernia. Preserved thyroid gland. Lungs/Pleura: There is some linear opacity seen along bases likely scar or atelectasis. No consolidation, pneumothorax or  effusion. There is a noncalcified nodule medially in the left upper lobe measuring 11 mm in maximum dimension on series 4, image 41. this has seen on the study of 2014 and at that time measured 8 x 6 mm. Slow interval growth over 10 years. Upper Abdomen: Adrenal glands are preserved in the upper  abdomen. Few transverse colonic diverticula. Musculoskeletal: Moderate degenerative changes scattered along the thoracic spine with bridging osteophytes. Multilevel disc height loss. Facet degenerative changes. Subtle rib injuries are identified. This includes along the anterior aspect of the right fifth rib, sixth rib. Left-sided anterior fifth rib as well and sixth rib. IMPRESSION: Subtle cortical deformities along the anterior aspect of the both right and left fifth and sixth ribs. Subtle fracture. No pneumothorax or effusion. Small hiatal hernia. There is left upper lobe lung nodule today measuring 11 x 7 mm. This nodule was present on the study of June 2014. At that time measured 8 x 6 mm. Relatively slow interval growth over 10 years favoring a benign lesion. Aortic Atherosclerosis (ICD10-I70.0). Electronically Signed   By: Karen Kays M.D.   On: 07/08/2023 13:09   DG Elbow 2 Views Left  Result Date: 07/08/2023 CLINICAL DATA:  Pain. EXAM: LEFT ELBOW - 2 VIEW COMPARISON:  None Available. FINDINGS: Severely comminuted displaced fracture of the distal humerus. Both epicondyles exist as free fragments. No evidence for dislocation. Fat pad elevation is consistent with joint effusion. IMPRESSION: Severely comminuted displaced fracture of the distal humerus. Electronically Signed   By: Kennith Center M.D.   On: 07/08/2023 11:14   DG Hand Complete Right  Result Date: 07/08/2023 CLINICAL DATA:  Pain. EXAM: RIGHT HAND - COMPLETE 3+ VIEW COMPARISON:  None Available. FINDINGS: No evidence of acute fracture. No subluxation or dislocation. Degenerative changes are noted in the first carpometacarpal joint as well as scattered IP joints. Mild degenerative changes are noted in the radial aspect of the carpus. TFCC calcification evident. IMPRESSION: 1. Degenerative changes in the first carpometacarpal joint and scattered IP joints. 2. TFCC calcification. Electronically Signed   By: Kennith Center M.D.   On: 07/08/2023  11:12   DG Wrist Complete Left  Result Date: 07/08/2023 CLINICAL DATA:  Pain. EXAM: LEFT WRIST - COMPLETE 3+ VIEW COMPARISON:  No comparison studies available. FINDINGS: Study limited by positioning. Within this limitation, no evidence for an acute fracture or dislocation. Degenerative changes are seen in the radial carpus and in the first carpometacarpal joint. Calcification of the TFCC evident. No worrisome lytic or sclerotic osseous abnormality. IMPRESSION: 1. Degenerative changes in the radial carpus and first carpometacarpal joint. 2. No acute bony findings. Electronically Signed   By: Kennith Center M.D.   On: 07/08/2023 11:11   DG Shoulder Left  Result Date: 07/08/2023 CLINICAL DATA:  Fall with left arm pain EXAM: LEFT SHOULDER - 3 VIEW COMPARISON:  None Available. FINDINGS: There is no evidence of fracture of the shoulder. The humeral head is superiorly subluxated relative to the glenoid. Partially imaged cortical irregularity of the distal humerus. Degenerative changes of the left shoulder. Old fracture deformity of the distal left clavicle. Soft tissues are unremarkable. IMPRESSION: 1. Superior subluxation of the humeral head relative to the glenoid. While findings may be related to degenerative changes, dislocation is not excluded. Recommend correlation with physical examination. Consider correlation with axillary view. 2. Partially imaged cortical irregularity of the distal humerus, better evaluated on dedicated elbow radiographs. Electronically Signed   By: Agustin Cree M.D.   On: 07/08/2023 10:52  CT Head Wo Contrast  Result Date: 07/08/2023 CLINICAL DATA:  Polytrauma, blunt EXAM: CT HEAD WITHOUT CONTRAST CT CERVICAL SPINE WITHOUT CONTRAST TECHNIQUE: Multidetector CT imaging of the head and cervical spine was performed following the standard protocol without intravenous contrast. Multiplanar CT image reconstructions of the cervical spine were also generated. RADIATION DOSE REDUCTION: This exam  was performed according to the departmental dose-optimization program which includes automated exposure control, adjustment of the mA and/or kV according to patient size and/or use of iterative reconstruction technique. COMPARISON:  CT Head 06/02/21 FINDINGS: CT HEAD FINDINGS Brain: No hemorrhage. No hydrocephalus. No extra-axial fluid collection. No mass effect. No mass lesion. No CT evidence of an acute cortical infarct. Vascular: No hyperdense vessel or unexpected calcification. Skull: Left frontal scalp soft tissue hematoma. No evidence of an underlying calvarial fracture. Sinuses/Orbits: No middle ear or mastoid effusion. Paranasal sinuses are clear. Orbits are unremarkable. Other: None. CT CERVICAL SPINE FINDINGS Alignment: Straightening of the normal cervical lordosis. Skull base and vertebrae: No acute fracture. No primary bone lesion or focal pathologic process. Soft tissues and spinal canal: No prevertebral fluid or swelling. No visible canal hematoma. Disc levels:  No evidence of high-grade spinal canal stenosis. Upper chest: Negative. Other: None IMPRESSION: 1. Left frontal scalp soft tissue hematoma. No evidence of an underlying calvarial fracture. No acute intracranial abnormality. 2. No acute fracture or traumatic subluxation of the cervical spine. Electronically Signed   By: Lorenza Cambridge M.D.   On: 07/08/2023 08:40   CT Cervical Spine Wo Contrast  Result Date: 07/08/2023 CLINICAL DATA:  Polytrauma, blunt EXAM: CT HEAD WITHOUT CONTRAST CT CERVICAL SPINE WITHOUT CONTRAST TECHNIQUE: Multidetector CT imaging of the head and cervical spine was performed following the standard protocol without intravenous contrast. Multiplanar CT image reconstructions of the cervical spine were also generated. RADIATION DOSE REDUCTION: This exam was performed according to the departmental dose-optimization program which includes automated exposure control, adjustment of the mA and/or kV according to patient size and/or  use of iterative reconstruction technique. COMPARISON:  CT Head 06/02/21 FINDINGS: CT HEAD FINDINGS Brain: No hemorrhage. No hydrocephalus. No extra-axial fluid collection. No mass effect. No mass lesion. No CT evidence of an acute cortical infarct. Vascular: No hyperdense vessel or unexpected calcification. Skull: Left frontal scalp soft tissue hematoma. No evidence of an underlying calvarial fracture. Sinuses/Orbits: No middle ear or mastoid effusion. Paranasal sinuses are clear. Orbits are unremarkable. Other: None. CT CERVICAL SPINE FINDINGS Alignment: Straightening of the normal cervical lordosis. Skull base and vertebrae: No acute fracture. No primary bone lesion or focal pathologic process. Soft tissues and spinal canal: No prevertebral fluid or swelling. No visible canal hematoma. Disc levels:  No evidence of high-grade spinal canal stenosis. Upper chest: Negative. Other: None IMPRESSION: 1. Left frontal scalp soft tissue hematoma. No evidence of an underlying calvarial fracture. No acute intracranial abnormality. 2. No acute fracture or traumatic subluxation of the cervical spine. Electronically Signed   By: Lorenza Cambridge M.D.   On: 07/08/2023 08:40   Family History Reviewed and non-contributory, no pertinent history of problems with bleeding or anesthesia      Review of Systems 14 system ROS conducted and negative except for that noted in HPI   OBJECTIVE  Vitals:No data found. General: Alert, no acute distress Cardiovascular: Warm extremities noted, regular rate and rhythm seen on bedside telemetry  Respiratory: No cyanosis, no use of accessory musculature GI: No organomegaly, abdomen is soft and non-tender Skin: No lesions in the area of chief  complaint other than those listed below in MSK exam.  Neurologic: Sensation intact distally save for the below mentioned MSK exam Psychiatric: Patient is competent for consent with normal mood and affect Lymphatic: No swelling obvious and reported  other than the area involved in the exam below  Extremities  RUE: Freely moving and grossly normal anatomic visualization of the RUE LUE: Long arm splint present to LUE in sling. Digits freely mobile with sensation and strength intact of the wrist and hand. CRT <2 seconds. Radial pulse easily palpated 2+ and symmetrical.      Test Results Imaging See above  Labs cbc Recent Labs    07/07/23 1041  WBC 6.5  HGB 15.0  HCT 48.0*  PLT 195    Labs inflam No results for input(s): "CRP" in the last 72 hours.  Invalid input(s): "ESR"  Labs coag No results for input(s): "INR", "PTT" in the last 72 hours.  Invalid input(s): "PT"  Recent Labs    07/07/23 1041  NA 143  K 4.6  CL 105  CO2 23  GLUCOSE 113*  BUN 10  CREATININE 0.78  CALCIUM 9.4     ASSESSMENT AND PLAN: 82 y.o. female with the following: Left comminuted and displaced intra articular fracture of the distal humerus  Discussed the nature of the injury as well as the care with the patient as well as the family.  Discussed options and non-operative versus operative measures. Dr. Everardo Pacific is planning to schedule patient for next Thursday at [facility] for ORIF vs Total Elbow replacement. Discussed both procedures with patient and husband briefly explaining both surgical modalities including post-op management and expectations for each. Decision for specific surgical approach will be determined by Dr. Everardo Pacific once the 3D CT scan of the elbow has been read and reviewed by Dr. Everardo Pacific. Patient and husband are ok with either procedure and will move forward with Dr. Austin Miles final recommendation.    Understanding this the patient/family elected to proceed with operative measures.  The risks and benefits of  surgical intervention including infection, bleeding, nerve injury, blood clots, among others, and they were willing to proceed.     - Plan: ORIF vs Total elbow replacement pending deicsion after 3D CT of L elbow. Plan for surgery  next Thursday at [facility] - NPO at midnight Wednesday 9/18 - Contact information: Dr. Ramond Marrow, Sander Radon, PA-C, Alfonse Alpers, PA-C   07/09/2023 7:54 AM

## 2023-07-08 NOTE — Progress Notes (Signed)
Orthopedic Tech Progress Note Patient Details:  Bethany Wolfe 1941/07/27 [id number]  Ortho Devices Type of Ortho Device: Long arm splint, Sling immobilizer Ortho Device/Splint Location: LUE Ortho Device/Splint Interventions: Ordered, Adjustment, Application   Post Interventions Patient Tolerated: Well Instructions Provided: Care of device, Adjustment of device  Bethany Wolfe 07/08/2023, 12:01 PM

## 2023-07-08 NOTE — ED Notes (Signed)
Wound on right hand cleaned and new dressing placed

## 2023-07-08 NOTE — ED Triage Notes (Signed)
Patient BIB EMS for fall this AM. Patient reports she was going to the bathroom when she stubbed her toe and fell. Patient c/o left arm, bil rib, and right knuckle pain. Patient denies hitting head and does not take blood thinners.

## 2023-07-08 NOTE — ED Provider Notes (Addendum)
[place] EMERGENCY DEPARTMENT AT [facility] Provider Note   CSN: [id number] Arrival date & time: 07/08/23  3016     History  Chief Complaint  Patient presents with   Marletta Lor    Bethany Wolfe is a 82 y.o. female.  Pt is a 82  yo female with pmhx significant for asthma and htn.  Pt said she stubbed her toe on the carpet and fell.  She denies hitting her head.  She is not on blood thinners.  She has a lot of pain in her left elbow and shoulder.  She was able to ambulate and denies leg pain.       Home Medications Prior to Admission medications   Medication Sig Start Date End Date Taking? Authorizing Provider  albuterol (ACCUNEB) 1.25 MG/3ML nebulizer solution Take 3 mLs (1.25 mg total) by nebulization every 6 (six) hours as needed for wheezing. 02/16/23  Yes Boscia, Heather E, NP  albuterol (VENTOLIN HFA) 108 (90 Base) MCG/ACT inhaler Inhale 2 puffs into the lungs every 6 (six) hours as needed for wheezing or shortness of breath. 02/16/23  Yes Boscia, Kathlynn Grate, NP  ibuprofen (ADVIL) 600 MG tablet Take 1 tablet (600 mg total) by mouth every 6 (six) hours as needed. 07/08/23  Yes Jacalyn Lefevre, MD  oxyCODONE-acetaminophen (PERCOCET/ROXICET) 5-325 MG tablet Take 1 tablet by mouth every 6 (six) hours as needed for severe pain. 07/08/23  Yes Jacalyn Lefevre, MD      Allergies    Alendronate sodium, Codeine, Lovastatin, Penicillin g, Pravastatin, Tetanus toxoid, Penicillins, and Tetanus toxoids    Review of Systems   Review of Systems  Musculoskeletal:        Left shoulder, elbow pain Right hand pain  All other systems reviewed and are negative.   Physical Exam Updated Vital Signs BP 110/66   Pulse 91   Temp 97.8 F (36.6 C) (Oral)   Resp 16   Ht 5\' 5"  (1.651 m)   Wt 113.4 kg   SpO2 96%   BMI 41.60 kg/m  Physical Exam Vitals and nursing note reviewed.  Constitutional:      Appearance: Normal appearance.  HENT:     Head: Normocephalic and atraumatic.      Right Ear: External ear normal.     Left Ear: External ear normal.     Nose: Nose normal.     Mouth/Throat:     Mouth: Mucous membranes are moist.     Pharynx: Oropharynx is clear.  Eyes:     Extraocular Movements: Extraocular movements intact.     Conjunctiva/sclera: Conjunctivae normal.     Pupils: Pupils are equal, round, and reactive to light.  Cardiovascular:     Rate and Rhythm: Normal rate and regular rhythm.     Pulses: Normal pulses.     Heart sounds: Normal heart sounds.  Pulmonary:     Effort: Pulmonary effort is normal.     Breath sounds: Normal breath sounds.  Abdominal:     General: Abdomen is flat. Bowel sounds are normal.     Palpations: Abdomen is soft.  Musculoskeletal:       Arms:     Cervical back: Normal range of motion and neck supple.     Comments: Bruising and swelling mainly localized in elbow  Skin:    General: Skin is warm.     Capillary Refill: Capillary refill takes less than 2 seconds.  Neurological:     General: No focal deficit present.  Mental Status: She is alert and oriented to person, place, and time.  Psychiatric:        Mood and Affect: Mood normal.        Behavior: Behavior normal.     ED Results / Procedures / Treatments   Labs (all labs ordered are listed, but only abnormal results are displayed) Labs Reviewed - No data to display  EKG None  Radiology CT Shoulder Left Wo Contrast  Result Date: 07/08/2023 CLINICAL DATA:  Shoulder trauma, instability or dislocation suspected. EXAM: CT OF THE UPPER LEFT EXTREMITY WITHOUT CONTRAST TECHNIQUE: Multidetector CT imaging of the left shoulder was performed according to the standard protocol. RADIATION DOSE REDUCTION: This exam was performed according to the departmental dose-optimization program which includes automated exposure control, adjustment of the mA and/or kV according to patient size and/or use of iterative reconstruction technique. COMPARISON:  Radiographs same date.  FINDINGS: Bones/Joint/Cartilage No evidence of acute fracture or dislocation at the shoulder. Mild glenohumeral degenerative changes with joint space narrowing and osteophytes. No significant shoulder joint effusion. 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 The humeral head is high-riding with marked narrowing of the subacromial space. There is fragmented spurring of the acromion. Mild acromioclavicular degenerative changes are noted. Ligaments Suboptimally assessed by CT. Muscles and Tendons Obliteration of the subacromial space consistent with a chronic rotator cuff tear. Prominent calcification within the supraspinatus tendon. Mild supraspinatus and moderate infraspinatus muscular atrophy. No evidence of intramuscular hematoma. Soft tissues Prominent fluid anteriorly in the subcoracoid bursa. No other periarticular fluid collections are identified. Incidentally noted is heterogeneous soft tissue between the tip of the left scapula and the chest wall, consistent with elastofibroma dorsi. 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 IMPRESSION: 1. No evidence of acute fracture or dislocation at the left shoulder. 2. 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 Mild supraspinatus and moderate infraspinatus muscular atrophy. 3. Mild glenohumeral and acromioclavicular degenerative changes. 4. Prominent fluid anteriorly in the subcoracoid bursa. 5. Incidental elastofibroma dorsi. 6.  Aortic Atherosclerosis (ICD10-I70.0). Electronically Signed   By: Carey Bullocks M.D.   On: 07/08/2023 13:47   CT Chest Wo Contrast  Result Date: 07/08/2023 CLINICAL DATA:  Blunt chest trauma. EXAM: CT CHEST WITHOUT CONTRAST TECHNIQUE: Multidetector CT imaging of the chest was performed following the standard protocol without IV contrast. RADIATION DOSE REDUCTION: This exam was performed according to the departmental dose-optimization program which includes automated exposure  control, adjustment of the mA and/or kV according to patient size and/or use of iterative reconstruction technique. COMPARISON:  CT 04/20/2013 FINDINGS: Cardiovascular: Small pericardial effusion. Heart is nonenlarged. Coronary artery calcifications are seen. On this non IV contrast exam the thoracic aorta has a normal course and caliber. Mild scattered atherosclerotic plaque overall. More significant plaque along the proximal left subclavian artery. Mediastinum/Nodes: On this non IV contrast exam there is no specific abnormal lymph node enlargement seen in the axillary regions, hilum or mediastinum. Small hiatal hernia. Preserved thyroid gland. Lungs/Pleura: There is some linear opacity seen along bases likely scar or atelectasis. No consolidation, pneumothorax or effusion. There is a noncalcified nodule medially in the left upper lobe measuring 11 mm in maximum dimension on series 4, image 41. this has seen on the study of 2014 and at that time measured 8 x 6 mm. Slow interval growth over 10 years. Upper Abdomen: Adrenal glands are preserved in the upper abdomen. Few transverse colonic diverticula. Musculoskeletal: Moderate degenerative changes scattered along the thoracic spine with bridging  osteophytes. Multilevel disc height loss. Facet degenerative changes. Subtle rib injuries are identified. This includes along the anterior aspect of the right fifth rib, sixth rib. Left-sided anterior fifth rib as well and sixth rib. IMPRESSION: Subtle cortical deformities along the anterior aspect of the both right and left fifth and sixth ribs. Subtle fracture. No pneumothorax or effusion. Small hiatal hernia. There is left upper lobe lung nodule today measuring 11 x 7 mm. This nodule was present on the study of June 2014. At that time measured 8 x 6 mm. Relatively slow interval growth over 10 years favoring a benign lesion. Aortic Atherosclerosis (ICD10-I70.0). Electronically Signed   By: Karen Kays M.D.   On: 07/08/2023  13:09   DG Elbow 2 Views Left  Result Date: 07/08/2023 CLINICAL DATA:  Pain. EXAM: LEFT ELBOW - 2 VIEW COMPARISON:  None Available. FINDINGS: Severely comminuted displaced fracture of the distal humerus. Both epicondyles exist as free fragments. No evidence for dislocation. Fat pad elevation is consistent with joint effusion. IMPRESSION: Severely comminuted displaced fracture of the distal humerus. Electronically Signed   By: Kennith Center M.D.   On: 07/08/2023 11:14   DG Hand Complete Right  Result Date: 07/08/2023 CLINICAL DATA:  Pain. EXAM: RIGHT HAND - COMPLETE 3+ VIEW COMPARISON:  None Available. FINDINGS: No evidence of acute fracture. No subluxation or dislocation. Degenerative changes are noted in the first carpometacarpal joint as well as scattered IP joints. Mild degenerative changes are noted in the radial aspect of the carpus. TFCC calcification evident. IMPRESSION: 1. Degenerative changes in the first carpometacarpal joint and scattered IP joints. 2. TFCC calcification. Electronically Signed   By: Kennith Center M.D.   On: 07/08/2023 11:12   DG Wrist Complete Left  Result Date: 07/08/2023 CLINICAL DATA:  Pain. EXAM: LEFT WRIST - COMPLETE 3+ VIEW COMPARISON:  No comparison studies available. FINDINGS: Study limited by positioning. Within this limitation, no evidence for an acute fracture or dislocation. Degenerative changes are seen in the radial carpus and in the first carpometacarpal joint. Calcification of the TFCC evident. No worrisome lytic or sclerotic osseous abnormality. IMPRESSION: 1. Degenerative changes in the radial carpus and first carpometacarpal joint. 2. No acute bony findings. Electronically Signed   By: Kennith Center M.D.   On: 07/08/2023 11:11   DG Shoulder Left  Result Date: 07/08/2023 CLINICAL DATA:  Fall with left arm pain EXAM: LEFT SHOULDER - 3 VIEW COMPARISON:  None Available. FINDINGS: There is no evidence of fracture of the shoulder. The humeral head is  superiorly subluxated relative to the glenoid. Partially imaged cortical irregularity of the distal humerus. Degenerative changes of the left shoulder. Old fracture deformity of the distal left clavicle. Soft tissues are unremarkable. IMPRESSION: 1. Superior subluxation of the humeral head relative to the glenoid. While findings may be related to degenerative changes, dislocation is not excluded. Recommend correlation with physical examination. Consider correlation with axillary view. 2. Partially imaged cortical irregularity of the distal humerus, better evaluated on dedicated elbow radiographs. Electronically Signed   By: Agustin Cree M.D.   On: 07/08/2023 10:52   CT Head Wo Contrast  Result Date: 07/08/2023 CLINICAL DATA:  Polytrauma, blunt EXAM: CT HEAD WITHOUT CONTRAST CT CERVICAL SPINE WITHOUT CONTRAST TECHNIQUE: Multidetector CT imaging of the head and cervical spine was performed following the standard protocol without intravenous contrast. Multiplanar CT image reconstructions of the cervical spine were also generated. RADIATION DOSE REDUCTION: This exam was performed according to the departmental dose-optimization program which includes automated  exposure control, adjustment of the mA and/or kV according to patient size and/or use of iterative reconstruction technique. COMPARISON:  CT Head 06/02/21 FINDINGS: CT HEAD FINDINGS Brain: No hemorrhage. No hydrocephalus. No extra-axial fluid collection. No mass effect. No mass lesion. No CT evidence of an acute cortical infarct. Vascular: No hyperdense vessel or unexpected calcification. Skull: Left frontal scalp soft tissue hematoma. No evidence of an underlying calvarial fracture. Sinuses/Orbits: No middle ear or mastoid effusion. Paranasal sinuses are clear. Orbits are unremarkable. Other: None. CT CERVICAL SPINE FINDINGS Alignment: Straightening of the normal cervical lordosis. Skull base and vertebrae: No acute fracture. No primary bone lesion or focal  pathologic process. Soft tissues and spinal canal: No prevertebral fluid or swelling. No visible canal hematoma. Disc levels:  No evidence of high-grade spinal canal stenosis. Upper chest: Negative. Other: None IMPRESSION: 1. Left frontal scalp soft tissue hematoma. No evidence of an underlying calvarial fracture. No acute intracranial abnormality. 2. No acute fracture or traumatic subluxation of the cervical spine. Electronically Signed   By: Lorenza Cambridge M.D.   On: 07/08/2023 08:40   CT Cervical Spine Wo Contrast  Result Date: 07/08/2023 CLINICAL DATA:  Polytrauma, blunt EXAM: CT HEAD WITHOUT CONTRAST CT CERVICAL SPINE WITHOUT CONTRAST TECHNIQUE: Multidetector CT imaging of the head and cervical spine was performed following the standard protocol without intravenous contrast. Multiplanar CT image reconstructions of the cervical spine were also generated. RADIATION DOSE REDUCTION: This exam was performed according to the departmental dose-optimization program which includes automated exposure control, adjustment of the mA and/or kV according to patient size and/or use of iterative reconstruction technique. COMPARISON:  CT Head 06/02/21 FINDINGS: CT HEAD FINDINGS Brain: No hemorrhage. No hydrocephalus. No extra-axial fluid collection. No mass effect. No mass lesion. No CT evidence of an acute cortical infarct. Vascular: No hyperdense vessel or unexpected calcification. Skull: Left frontal scalp soft tissue hematoma. No evidence of an underlying calvarial fracture. Sinuses/Orbits: No middle ear or mastoid effusion. Paranasal sinuses are clear. Orbits are unremarkable. Other: None. CT CERVICAL SPINE FINDINGS Alignment: Straightening of the normal cervical lordosis. Skull base and vertebrae: No acute fracture. No primary bone lesion or focal pathologic process. Soft tissues and spinal canal: No prevertebral fluid or swelling. No visible canal hematoma. Disc levels:  No evidence of high-grade spinal canal stenosis.  Upper chest: Negative. Other: None IMPRESSION: 1. Left frontal scalp soft tissue hematoma. No evidence of an underlying calvarial fracture. No acute intracranial abnormality. 2. No acute fracture or traumatic subluxation of the cervical spine. Electronically Signed   By: Lorenza Cambridge M.D.   On: 07/08/2023 08:40    Procedures .Ortho Injury Treatment  Date/Time: 07/08/2023 1:58 PM  Performed by: Jacalyn Lefevre, MD Authorized by: Jacalyn Lefevre, MD   Consent:    Consent obtained:  VerbalInjury location: elbow Location details: left elbow Injury type: fracture Pre-procedure neurovascular assessment: neurovascularly intact Pre-procedure distal perfusion: normal Pre-procedure neurological function: normal Pre-procedure range of motion: reduced  Anesthesia: Local anesthesia used: no  Patient sedated: NoManipulation performed: no Immobilization: splint Splint type: long arm Splint Applied by: ED Tech Supplies used: cotton padding, elastic bandage and Ortho-Glass Post-procedure neurovascular assessment: post-procedure neurovascularly intact Post-procedure distal perfusion: normal Post-procedure neurological function: normal Post-procedure range of motion: unchanged       Medications Ordered in ED Medications  morphine (PF) 4 MG/ML injection 4 mg (4 mg Intravenous Given 07/08/23 0745)  ondansetron (ZOFRAN) injection 4 mg (4 mg Intravenous Given 07/08/23 0744)  HYDROmorphone (DILAUDID) injection 1 mg (1  mg Intravenous Given 07/08/23 1037)  bacitracin ointment (1 Application Topical Given 07/08/23 1209)    ED Course/ Medical Decision Making/ A&P                                 Medical Decision Making Amount and/or Complexity of Data Reviewed Radiology: ordered.  Risk OTC drugs. Prescription drug management.   This patient presents to the ED for concern of fall, this involves an extensive number of treatment options, and is a complaint that carries with it a high risk of  complications and morbidity.  The differential diagnosis includes multiple trauma   Co morbidities that complicate the patient evaluation  Asthma and htn   Additional history obtained:  Additional history obtained from epic chart review External records from outside source obtained and reviewed including husband  Imaging Studies ordered:  I ordered imaging studies including ct head/ct cspine/chest/elbow/shoulder + plain films I independently visualized and interpreted imaging which showed  CT head/c-spine:  Left frontal scalp soft tissue hematoma. No evidence of an  underlying calvarial fracture. No acute intracranial abnormality.  2. No acute fracture or traumatic subluxation of the cervical spine.  L shoulder: Superior subluxation of the humeral head relative to the glenoid.  While findings may be related to degenerative changes, dislocation  is not excluded. Recommend correlation with physical examination.  Consider correlation with axillary view.  2. Partially imaged cortical irregularity of the distal humerus,  better evaluated on dedicated elbow radiographs.   L wrist: . Degenerative changes in the radial carpus and first  carpometacarpal joint.  2. No acute bony findings.   L elbow: Severely comminuted displaced fracture of the distal humerus.  R hand:  Degenerative changes in the first carpometacarpal joint and  scattered IP joints.  2. TFCC calcification.  CT chest: Subtle cortical deformities along the anterior aspect of the both  right and left fifth and sixth ribs. Subtle fracture. No  pneumothorax or effusion.    Small hiatal hernia.    There is left upper lobe lung nodule today measuring 11 x 7 mm. This  nodule was present on the study of June 2014. At that time measured  8 x 6 mm. Relatively slow interval growth over 10 years favoring a  benign lesion.    Aortic Atherosclerosis (ICD10-I70.0).   CT shoulder:  No evidence of acute fracture or  dislocation at the left  shoulder.  2. Chronic rotator cuff tear with marked narrowing of the  subacromial space. Mild supraspinatus and moderate infraspinatus  muscular atrophy.  3. Mild glenohumeral and acromioclavicular degenerative changes.  4. Prominent fluid anteriorly in the subcoracoid bursa.  5. Incidental elastofibroma dorsi.  6.  Aortic Atherosclerosis (ICD10-I70.0).  CT elbow:  Severely comminuted and displaced intra-articular fracture of the  distal humerus as described.  2. Possible small fracture of the tip of the olecranon process.  3. No evidence of radial head fracture or dislocation.  4. Posterior soft tissue swelling without focal hematoma or foreign  body.    I agree with the radiologist interpretation   Cardiac Monitoring:  The patient was maintained on a cardiac monitor.  I personally viewed and interpreted the cardiac monitored which showed an underlying rhythm of: nsr   Medicines ordered and prescription drug management:  I ordered medication including morphine/zofran  for sx  Reevaluation of the patient after these medicines showed that the patient improved I have reviewed the patients  home medicines and have made adjustments as needed   Test Considered:  ct   Critical Interventions:  Pain control   Consultations Obtained:  I requested consultation with the orthopedist (Dr. Everardo Pacific),  and discussed lab and imaging findings as well as pertinent plan -he recommended long arm splint, CT elbow.  He has scheduled her for surgery on 8/19.  His PA did see her here.   Problem List / ED Course:  Distal humerus fx:  pt scheduled for surgery on 8/19 Rib fx:  pain under control.  Incentive spirometer given prior to d/c.  She is d/c with percocet.   Reevaluation:  After the interventions noted above, I reevaluated the patient and found that they have :improved   Social Determinants of Health:  Lives at home   Dispostion:  After  consideration of the diagnostic results and the patients response to treatment, I feel that the patent would benefit from discharge with outpatient f/u.          Final Clinical Impression(s) / ED Diagnoses Final diagnoses:  Fall, initial encounter  Closed fracture of multiple ribs of left side, initial encounter  Closed fracture of distal end of left humerus, unspecified fracture morphology, initial encounter    Rx / DC Orders ED Discharge Orders          Ordered    oxyCODONE-acetaminophen (PERCOCET/ROXICET) 5-325 MG tablet  Every 6 hours PRN        07/08/23 1356    ibuprofen (ADVIL) 600 MG tablet  Every 6 hours PRN        07/08/23 1356              Jacalyn Lefevre, MD 07/08/23 1359    Jacalyn Lefevre, MD 07/08/23 1418

## 2023-07-12 ENCOUNTER — Ambulatory Visit: Payer: PPO | Admitting: Family Medicine

## 2023-07-13 NOTE — Discharge Instructions (Signed)
Ramond Marrow MD, MPH Alfonse Alpers, PA-C [facility] [address] [phone] (tel)   [phone] (fax)   POST-OPERATIVE INSTRUCTIONS   WOUND CARE Please keep splint clean dry and intact until followup.  You may shower on Post-Op Day #3.  You must keep splint dry during this process and may find that a plastic bag taped around the extremity or alternatively a towel based bath may be a better option.   If you get your splint wet or if it is damaged please contact our clinic.  EXERCISES Due to your splint being in place you will not be able to bear weight through your extremity.    You may use a sling for comfort   It is normal for your fingers/hand to become more swollen after surgery and discolored from bruising.   This will resolve over the first few weeks usually after surgery. Please continue to ambulate and do not stay sitting or lying for too long.  Perform foot and wrist pumps to assist in circulation.   REGIONAL ANESTHESIA (NERVE BLOCKS) The anesthesia team may have performed a nerve block for you this is a great tool used to minimize pain.   The block may start wearing off overnight (between 8-24 hours postop) When the block wears off, your pain may go from nearly zero to the pain you would have had postop without the block. This is an abrupt transition but nothing dangerous is happening.   This can be a challenging period but utilize your as needed pain medications to try and manage this period. We suggest you use the pain medication the first night prior to going to bed, to ease this transition.  You may take an extra dose of narcotic when this happens if needed   POST-OP MEDICATIONS- Multimodal approach to pain control In general your pain will be controlled with a combination of substances.  Prescriptions unless otherwise discussed are electronically sent to your pharmacy.  This is a carefully made plan we use to minimize  narcotic use.     Celebrex - Anti-inflammatory medication taken on a scheduled basis Acetaminophen - Non-narcotic pain medicine taken on a scheduled basis  Gabapentin - this is to help with nerve based pain, take on a scheduled basis Oxycodone - This is a strong narcotic, to be used only on an "as needed" basis for SEVERE pain. Zofran - take as needed for nausea   FOLLOW-UP If you develop a Fever (>101.5), Redness or Drainage from the surgical incision site, please call our office to arrange for an evaluation. Please call the office to schedule a follow-up appointment for your incision check if you do not already have one, 7-10 days post-operatively.   HELPFUL INFORMATION    You may be more comfortable sleeping in a semi-seated position the first few nights following surgery.  Keep a pillow propped under the elbow and forearm for comfort.  If you have a recliner type of chair it might be beneficial.  If not that is fine too, but it would be helpful to sleep propped up with pillows behind your operated shoulder as well under your elbow and forearm.  This will reduce pulling on the suture lines.   When dressing, put your operative arm in the sleeve first.  When getting undressed, take your operative arm out last.  Loose fitting, button-down shirts are recommended.  Often in the first days after surgery you may be more comfortable keeping your operative arm under your  shirt and not through the sleeve.   You may return to work/school in the next couple of days when you feel up to it.  Desk work and typing in the sling is fine.   We suggest you use the pain medication the first night prior to going to bed, in order to ease any pain when the anesthesia wears off. You should avoid taking pain medications on an empty stomach as it will make you nauseous.   You should wean off your narcotic medicines as soon as you are able.  Most patients will be off narcotics before their first postop appointment.     Do not drink alcoholic beverages or take illicit drugs when taking pain medications.   It is against the law to drive while taking narcotics.  In some states it is against the law to drive while your arm is in a sling.    Pain medication may make you constipated.  Below are a few solutions to try in this order:   - Decrease the amount of pain medication if you aren't having pain.   - Drink lots of decaffeinated fluids.   - Drink prune juice and/or each dried prunes   If the first 3 don't work start with additional solutions   - Take Colace - an over-the-counter stool softener   - Take Senokot - an over-the-counter laxative   - Take Miralax - a stronger over-the-counter laxative   For more information including helpful videos and documents visit our website:   [email or web address]

## 2023-07-13 NOTE — H&P (Signed)
PREOPERATIVE H&P  Chief Complaint: LEFT HUMERUS FRACTURE  HPI: Bethany Wolfe is a 82 y.o. female who is scheduled for, Procedure(s): OPEN REDUCTION INTERNAL FIXATION (ORIF) DISTAL HUMERUS FRACTURE ANTERIOR INTEROSSEOUS NERVE DECOMPRESSION.   Patient has a past medical history significant for asthma.   Bethany Wolfe is a 82 y.o. female with pmh significant for HTN, Asthma and Morbid Obesity (BMI 42) who presented to the ED after falling at home. 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 Left elbow XR demonstrated a severely comminuted and displaced supracondylar fracture of the distal humerus. Subsequent CT of the left elbow confirmed a severely comminuted and displaced intra articular fracture of the distal humerus. Orthopedics was consulted for recommendations and management.   Symptoms are rated as moderate to severe, and have been worsening.  This is significantly impairing activities of daily living.    Please see clinic note for further details on this patient's care.    She has elected for surgical management.   Past Medical History:  Diagnosis Date   Asthma    Humerus fracture    left   Past Surgical History:  Procedure Laterality Date   ABDOMINAL HYSTERECTOMY     COLONOSCOPY     Social History   Socioeconomic History   Marital status: Married    Spouse name: Not on file   Number of children: Not on file   Years of education: Not on file   Highest education level: Not on file  Occupational History   Not on file  Tobacco Use   Smoking status: Former    Types: Cigarettes   Smokeless tobacco: Never  Substance and Sexual Activity   Alcohol use: Never   Drug use: Never   Sexual activity: Not Currently  Other Topics Concern   Not on file  Social History Narrative   Not on file   Social  Determinants of Health   Financial Resource Strain: Low Risk  (03/03/2023)   Overall Financial Resource Strain (CARDIA)    Difficulty of Paying Living Expenses: Not hard at all  Food Insecurity: No Food Insecurity (03/03/2023)   Hunger Vital Sign    Worried About Running Out of Food in the Last Year: Never true    Ran Out of Food in the Last Year: Never true  Transportation Needs: No Transportation Needs (03/03/2023)   PRAPARE - Administrator, Civil Service (Medical): No    Lack of Transportation (Non-Medical): No  Physical Activity: Inactive (03/03/2023)   Exercise Vital Sign    Days of Exercise per Week: 0 days    Minutes of Exercise per Session: 0 min  Stress: No Stress Concern Present (03/03/2023)   [employer] of Occupational Health - Occupational Stress Questionnaire    Feeling of Stress : Not at all  Social Connections: Socially Integrated (03/03/2023)   Social Connection and Isolation Panel [NHANES]    Frequency of Communication with Friends and Family: More than three times a week    Frequency of Social Gatherings with Friends and Family: More than three times a week    Attends Religious Services: More than 4 times per year    Active Member of [employer] or Organizations: Yes    Attends Engineer, structural: More than 4 times per year    Marital Status: Married   History  reviewed. No pertinent family history. Allergies  Allergen Reactions   Alendronate Sodium     Other reaction(s): GI Upset (intolerance)   Codeine Nausea And Vomiting   Lovastatin Nausea Only   Penicillin G Hives    Big whelps   Pravastatin     Other reaction(s): Myalgias (intolerance)   Tetanus Toxoid Hives   Penicillins Rash   Tetanus Toxoids Rash   Prior to Admission medications   Medication Sig Start Date End Date Taking? Authorizing Provider  albuterol (VENTOLIN HFA) 108 (90 Base) MCG/ACT inhaler Inhale 2 puffs into the lungs every 6 (six) hours as needed for wheezing or shortness  of breath. 02/16/23  Yes Carlean Jews, NP  albuterol (ACCUNEB) 1.25 MG/3ML nebulizer solution Take 3 mLs (1.25 mg total) by nebulization every 6 (six) hours as needed for wheezing. 02/16/23   Carlean Jews, NP  ibuprofen (ADVIL) 600 MG tablet Take 1 tablet (600 mg total) by mouth every 6 (six) hours as needed. 07/08/23   Jacalyn Lefevre, MD  oxyCODONE-acetaminophen (PERCOCET/ROXICET) 5-325 MG tablet Take 1 tablet by mouth every 6 (six) hours as needed for severe pain. 07/08/23   Jacalyn Lefevre, MD    ROS: All other systems have been reviewed and were otherwise negative with the exception of those mentioned in the HPI and as above.  Physical Exam: General: Alert, no acute distress Cardiovascular: No pedal edema Respiratory: No cyanosis, no use of accessory musculature GI: No organomegaly, abdomen is soft and non-tender Skin: No lesions in the area of chief complaint Neurologic: Sensation intact distally Psychiatric: Patient is competent for consent with normal mood and affect Lymphatic: No axillary or cervical lymphadenopathy  MUSCULOSKELETAL:  Long arm splint present to LUE in sling. Digits freely mobile with sensation and strength intact of the wrist and hand. CRT <2 seconds. Radial pulse easily palpated 2+ and symmetrical.   Imaging: CT demonstrates severely comminuted and displaced intra-articular fracture of the distal humerus as described  Assessment: LEFT HUMERUS FRACTURE  Plan: Plan for Procedure(s): OPEN REDUCTION INTERNAL FIXATION (ORIF) DISTAL HUMERUS FRACTURE ANTERIOR INTEROSSEOUS NERVE DECOMPRESSION  The risks benefits and alternatives were discussed with the patient including but not limited to the risks of nonoperative treatment, versus surgical intervention including infection, bleeding, nerve injury,  blood clots, cardiopulmonary complications, morbidity, mortality, among others, and they were willing to proceed.   The patient acknowledged the explanation,  agreed to proceed with the plan and consent was signed.   Operative Plan: ORIF left distal humerus fracture Discharge Medications: standard DVT Prophylaxis: none Physical Therapy: outpatient PT Special Discharge needs: Splint vs bulky dressing. Sling.    Vernetta Honey, PA-C  07/13/2023 6:45 PM

## 2023-07-14 ENCOUNTER — Ambulatory Visit (HOSPITAL_BASED_OUTPATIENT_CLINIC_OR_DEPARTMENT_OTHER): Payer: PPO | Admitting: Certified Registered"

## 2023-07-14 ENCOUNTER — Ambulatory Visit (HOSPITAL_BASED_OUTPATIENT_CLINIC_OR_DEPARTMENT_OTHER)
Admission: RE | Admit: 2023-07-14 | Discharge: 2023-07-14 | Disposition: A | Discharge status: Home / Self Care | Payer: PPO | Attending: Orthopaedic Surgery | Admitting: Orthopaedic Surgery

## 2023-07-14 ENCOUNTER — Encounter (HOSPITAL_BASED_OUTPATIENT_CLINIC_OR_DEPARTMENT_OTHER): Payer: Self-pay | Admitting: Orthopaedic Surgery

## 2023-07-14 ENCOUNTER — Ambulatory Visit (HOSPITAL_COMMUNITY): Payer: PPO

## 2023-07-14 ENCOUNTER — Other Ambulatory Visit: Payer: Self-pay

## 2023-07-14 ENCOUNTER — Encounter (HOSPITAL_BASED_OUTPATIENT_CLINIC_OR_DEPARTMENT_OTHER)
Admission: RE | Disposition: A | Discharge status: Home / Self Care | Payer: Self-pay | Source: Home / Self Care | Attending: Orthopaedic Surgery

## 2023-07-14 DIAGNOSIS — R609 Edema, unspecified: Secondary | ICD-10-CM | POA: Diagnosis not present

## 2023-07-14 DIAGNOSIS — J45909 Unspecified asthma, uncomplicated: Secondary | ICD-10-CM | POA: Insufficient documentation

## 2023-07-14 DIAGNOSIS — G5622 Lesion of ulnar nerve, left upper limb: Secondary | ICD-10-CM | POA: Diagnosis not present

## 2023-07-14 DIAGNOSIS — I10 Essential (primary) hypertension: Secondary | ICD-10-CM

## 2023-07-14 DIAGNOSIS — W19XXXA Unspecified fall, initial encounter: Secondary | ICD-10-CM | POA: Insufficient documentation

## 2023-07-14 DIAGNOSIS — S42402A Unspecified fracture of lower end of left humerus, initial encounter for closed fracture: Secondary | ICD-10-CM | POA: Diagnosis not present

## 2023-07-14 DIAGNOSIS — I2510 Atherosclerotic heart disease of native coronary artery without angina pectoris: Secondary | ICD-10-CM

## 2023-07-14 DIAGNOSIS — S42302A Unspecified fracture of shaft of humerus, left arm, initial encounter for closed fracture: Secondary | ICD-10-CM

## 2023-07-14 DIAGNOSIS — Z87891 Personal history of nicotine dependence: Secondary | ICD-10-CM | POA: Diagnosis not present

## 2023-07-14 DIAGNOSIS — Z6841 Body Mass Index (BMI) 40.0 and over, adult: Secondary | ICD-10-CM | POA: Insufficient documentation

## 2023-07-14 DIAGNOSIS — S42412A Displaced simple supracondylar fracture without intercondylar fracture of left humerus, initial encounter for closed fracture: Secondary | ICD-10-CM | POA: Diagnosis not present

## 2023-07-14 DIAGNOSIS — E785 Hyperlipidemia, unspecified: Secondary | ICD-10-CM

## 2023-07-14 DIAGNOSIS — G8918 Other acute postprocedural pain: Secondary | ICD-10-CM | POA: Diagnosis not present

## 2023-07-14 DIAGNOSIS — G588 Other specified mononeuropathies: Secondary | ICD-10-CM | POA: Insufficient documentation

## 2023-07-14 DIAGNOSIS — Z9889 Other specified postprocedural states: Secondary | ICD-10-CM | POA: Diagnosis not present

## 2023-07-14 DIAGNOSIS — S42422A Displaced comminuted supracondylar fracture without intercondylar fracture of left humerus, initial encounter for closed fracture: Secondary | ICD-10-CM | POA: Insufficient documentation

## 2023-07-14 DIAGNOSIS — Z01818 Encounter for other preprocedural examination: Secondary | ICD-10-CM

## 2023-07-14 HISTORY — PX: ANTERIOR INTEROSSEOUS NERVE DECOMPRESSION: SHX5735

## 2023-07-14 HISTORY — PX: ORIF HUMERUS FRACTURE: SHX2126

## 2023-07-14 HISTORY — DX: Unspecified fracture of shaft of humerus, unspecified arm, initial encounter for closed fracture: S42.309A

## 2023-07-14 SURGERY — OPEN REDUCTION INTERNAL FIXATION (ORIF) DISTAL HUMERUS FRACTURE
Anesthesia: General | Site: Shoulder | Laterality: Left

## 2023-07-14 MED ORDER — OXYCODONE HCL 5 MG PO TABS
ORAL_TABLET | ORAL | 0 refills | Status: AC
Start: 2023-07-14 — End: 2023-07-18

## 2023-07-14 MED ORDER — PROPOFOL 10 MG/ML IV BOLUS
INTRAVENOUS | Status: DC | PRN
  Administered 2023-07-14: 140 mg via INTRAVENOUS

## 2023-07-14 MED ORDER — LACTATED RINGERS IV SOLN: INTRAVENOUS | Status: DC

## 2023-07-14 MED ORDER — CEFAZOLIN SODIUM-DEXTROSE 2-4 GM/100ML-% IV SOLN
2.0000 g | INTRAVENOUS | Status: AC
  Administered 2023-07-14: 2 g via INTRAVENOUS

## 2023-07-14 MED ORDER — GABAPENTIN 300 MG PO CAPS: 300.0000 mg | ORAL_CAPSULE | Freq: Once | ORAL | Status: DC

## 2023-07-14 MED ORDER — LIDOCAINE 2% (20 MG/ML) 5 ML SYRINGE
INTRAMUSCULAR | Status: DC | PRN
  Administered 2023-07-14: 100 mg via INTRAVENOUS

## 2023-07-14 MED ORDER — DEXAMETHASONE SODIUM PHOSPHATE 10 MG/ML IJ SOLN
INTRAMUSCULAR | Status: AC
  Filled 2023-07-14: qty 1

## 2023-07-14 MED ORDER — HYDROMORPHONE HCL 1 MG/ML IJ SOLN: 0.2500 mg | INTRAMUSCULAR | Status: DC | PRN

## 2023-07-14 MED ORDER — DROPERIDOL 2.5 MG/ML IJ SOLN: 0.6250 mg | Freq: Once | INTRAMUSCULAR | Status: DC | PRN

## 2023-07-14 MED ORDER — ACETAMINOPHEN 500 MG PO TABS: 1000.0000 mg | ORAL_TABLET | Freq: Three times a day (TID) | ORAL | 0 refills | Status: AC

## 2023-07-14 MED ORDER — FENTANYL CITRATE (PF) 100 MCG/2ML IJ SOLN
INTRAMUSCULAR | Status: DC | PRN
  Administered 2023-07-14: 50 ug via INTRAVENOUS

## 2023-07-14 MED ORDER — 0.9 % SODIUM CHLORIDE (POUR BTL) OPTIME
TOPICAL | Status: DC | PRN
  Administered 2023-07-14: 500 mL

## 2023-07-14 MED ORDER — ONDANSETRON HCL 4 MG/2ML IJ SOLN
INTRAMUSCULAR | Status: DC | PRN
  Administered 2023-07-14: 4 mg via INTRAVENOUS

## 2023-07-14 MED ORDER — FENTANYL CITRATE (PF) 100 MCG/2ML IJ SOLN
INTRAMUSCULAR | Status: AC
  Filled 2023-07-14: qty 2

## 2023-07-14 MED ORDER — CELECOXIB 100 MG PO CAPS: 100.0000 mg | ORAL_CAPSULE | Freq: Two times a day (BID) | ORAL | 0 refills | Status: AC

## 2023-07-14 MED ORDER — ROCURONIUM BROMIDE 10 MG/ML (PF) SYRINGE
PREFILLED_SYRINGE | INTRAVENOUS | Status: AC
  Filled 2023-07-14: qty 10

## 2023-07-14 MED ORDER — LIDOCAINE 2% (20 MG/ML) 5 ML SYRINGE
INTRAMUSCULAR | Status: AC
  Filled 2023-07-14: qty 5

## 2023-07-14 MED ORDER — VANCOMYCIN HCL 1000 MG IV SOLR
INTRAVENOUS | Status: DC | PRN
Start: 2023-07-14 — End: 2023-07-14
  Administered 2023-07-14: 1000 mg via TOPICAL

## 2023-07-14 MED ORDER — OXYCODONE HCL 5 MG/5ML PO SOLN: 5.0000 mg | Freq: Once | ORAL | Status: DC | PRN

## 2023-07-14 MED ORDER — SUGAMMADEX SODIUM 200 MG/2ML IV SOLN
INTRAVENOUS | Status: DC | PRN
  Administered 2023-07-14: 400 mg via INTRAVENOUS

## 2023-07-14 MED ORDER — ONDANSETRON HCL 4 MG PO TABS: 4.0000 mg | ORAL_TABLET | Freq: Three times a day (TID) | ORAL | 0 refills | Status: AC | PRN

## 2023-07-14 MED ORDER — ONDANSETRON HCL 4 MG/2ML IJ SOLN
INTRAMUSCULAR | Status: AC
  Filled 2023-07-14: qty 2

## 2023-07-14 MED ORDER — CEFAZOLIN SODIUM-DEXTROSE 2-4 GM/100ML-% IV SOLN
INTRAVENOUS | Status: AC
  Filled 2023-07-14: qty 100

## 2023-07-14 MED ORDER — OXYCODONE HCL 5 MG PO TABS: 5.0000 mg | ORAL_TABLET | Freq: Once | ORAL | Status: DC | PRN

## 2023-07-14 MED ORDER — MIDAZOLAM HCL 2 MG/2ML IJ SOLN
INTRAMUSCULAR | Status: AC
  Filled 2023-07-14: qty 2

## 2023-07-14 MED ORDER — BUPIVACAINE LIPOSOME 1.3 % IJ SUSP
INTRAMUSCULAR | Status: DC | PRN
Start: 2023-07-14 — End: 2023-07-14
  Administered 2023-07-14: 10 mL via PERINEURAL

## 2023-07-14 MED ORDER — ACETAMINOPHEN 500 MG PO TABS
ORAL_TABLET | ORAL | Status: AC
  Filled 2023-07-14: qty 2

## 2023-07-14 MED ORDER — ACETAMINOPHEN 500 MG PO TABS
1000.0000 mg | ORAL_TABLET | Freq: Once | ORAL | Status: AC
  Administered 2023-07-14: 1000 mg via ORAL

## 2023-07-14 MED ORDER — FENTANYL CITRATE (PF) 100 MCG/2ML IJ SOLN
100.0000 ug | Freq: Once | INTRAMUSCULAR | Status: AC
  Administered 2023-07-14: 50 ug via INTRAVENOUS

## 2023-07-14 MED ORDER — DEXAMETHASONE SODIUM PHOSPHATE 10 MG/ML IJ SOLN
INTRAMUSCULAR | Status: DC | PRN
  Administered 2023-07-14: 5 mg via INTRAVENOUS

## 2023-07-14 MED ORDER — ROCURONIUM BROMIDE 100 MG/10ML IV SOLN
INTRAVENOUS | Status: DC | PRN
  Administered 2023-07-14: 60 mg via INTRAVENOUS

## 2023-07-14 MED ORDER — PROPOFOL 10 MG/ML IV BOLUS
INTRAVENOUS | Status: AC
  Filled 2023-07-14: qty 20

## 2023-07-14 MED ORDER — BUPIVACAINE HCL (PF) 0.5 % IJ SOLN
INTRAMUSCULAR | Status: DC | PRN
Start: 2023-07-14 — End: 2023-07-14
  Administered 2023-07-14: 10 mL via PERINEURAL

## 2023-07-14 MED ORDER — ASPIRIN 81 MG PO CHEW: 81.0000 mg | CHEWABLE_TABLET | Freq: Two times a day (BID) | ORAL | 1 refills | Status: AC

## 2023-07-14 SURGICAL SUPPLY — 97 items
APL PRP STRL LF DISP 70% ISPRP (MISCELLANEOUS) ×1
BIT DRILL 2.5MM SMALL QC EVOS (BIT) IMPLANT
BIT DRILL 3.2 QUICK MINI 300 (DRILL) IMPLANT
BIT DRILL 5.0 QC 6.5 (BIT) IMPLANT
BIT DRILL QC 2.0 SHORT EVOS SM (DRILL) IMPLANT
BIT DRILL QC 2.5MM SHRT EVO SM (DRILL) IMPLANT
BLADE AVERAGE 25X9 (BLADE) IMPLANT
BLADE HEX COATED 2.75 (ELECTRODE) ×1 IMPLANT
BLADE SURG 10 STRL SS (BLADE) ×1 IMPLANT
BLADE SURG 15 STRL LF DISP TIS (BLADE) ×1 IMPLANT
BLADE SURG 15 STRL SS (BLADE) ×1
BNDG CMPR 5X4 KNIT ELC UNQ LF (GAUZE/BANDAGES/DRESSINGS) ×1
BNDG CMPR 9X4 STRL LF SNTH (GAUZE/BANDAGES/DRESSINGS)
BNDG ELASTIC 4INX 5YD STR LF (GAUZE/BANDAGES/DRESSINGS) ×1 IMPLANT
BNDG ESMARK 4X9 LF (GAUZE/BANDAGES/DRESSINGS) IMPLANT
CHLORAPREP W/TINT 26 (MISCELLANEOUS) ×1 IMPLANT
CLSR STERI-STRIP ANTIMIC 1/2X4 (GAUZE/BANDAGES/DRESSINGS) ×1 IMPLANT
CUFF TOURN SGL QUICK 18X3 (MISCELLANEOUS) ×1 IMPLANT
DRAPE C-ARM 42X72 X-RAY (DRAPES) IMPLANT
DRAPE C-ARMOR (DRAPES) IMPLANT
DRAPE EXTREMITY T 121X128X90 (DISPOSABLE) ×1 IMPLANT
DRAPE INCISE IOBAN 66X45 STRL (DRAPES) IMPLANT
DRAPE STERI 35X30 U-POUCH (DRAPES) IMPLANT
DRAPE U-SHAPE 47X51 STRL (DRAPES) ×1 IMPLANT
DRILL 2.5MM SMALL QC EVOS (BIT) ×1
DRILL QC 2.0 SHORT EVOS SM (DRILL) ×1
DRILL QC 2.5MM SHORT EVOS SM (DRILL) ×1
ELECT REM PT RETURN 9FT ADLT (ELECTROSURGICAL) ×1
ELECTRODE REM PT RTRN 9FT ADLT (ELECTROSURGICAL) ×1 IMPLANT
EXT HOSE W/PLC CONNECTION (MISCELLANEOUS)
EXTENSION HOSE W/PLC CONNECTON (MISCELLANEOUS) IMPLANT
GAUZE SPONGE 4X4 12PLY STRL (GAUZE/BANDAGES/DRESSINGS) ×1 IMPLANT
GLOVE BIO SURGEON STRL SZ 6.5 (GLOVE) ×1 IMPLANT
GLOVE BIOGEL PI IND STRL 6.5 (GLOVE) ×1 IMPLANT
GLOVE BIOGEL PI IND STRL 8 (GLOVE) ×1 IMPLANT
GLOVE ECLIPSE 8.0 STRL XLNG CF (GLOVE) ×1 IMPLANT
GOWN STRL REUS W/ TWL LRG LVL3 (GOWN DISPOSABLE) ×2 IMPLANT
GOWN STRL REUS W/TWL LRG LVL3 (GOWN DISPOSABLE) ×2
GOWN STRL REUS W/TWL XL LVL3 (GOWN DISPOSABLE) ×1 IMPLANT
K-WIRE 1.6 (WIRE) ×3
K-WIRE FX150X1.6XTROC PNT (WIRE) ×3
K-WIRE TROCAR PT 2.0 150MM (WIRE) ×2
KWIRE FX150X1.6XTROC PNT (WIRE) IMPLANT
KWIRE TROCAR PT 2.0 150 (WIRE) IMPLANT
LOOP VASCLR MAXI BLUE 18IN ST (MISCELLANEOUS) IMPLANT
LOOP VASCULAR MAXI 18 BLUE (MISCELLANEOUS)
NS IRRIG 1000ML POUR BTL (IV SOLUTION) ×1 IMPLANT
PACK ARTHROSCOPY DSU (CUSTOM PROCEDURE TRAY) ×1 IMPLANT
PACK BASIN DAY SURGERY FS (CUSTOM PROCEDURE TRAY) ×1 IMPLANT
PAD CAST 4YDX4 CTTN HI CHSV (CAST SUPPLIES) ×1 IMPLANT
PADDING CAST COTTON 4X4 STRL (CAST SUPPLIES) ×1
PENCIL SMOKE EVACUATOR (MISCELLANEOUS) ×1 IMPLANT
PLATE HUM EVOS 5H L 2.7X102 (Plate) IMPLANT
PLATE HUM EVOS 6H L 2.7X85 (Plate) IMPLANT
RETRIEVER SUT HEWSON (MISCELLANEOUS) IMPLANT
SCREW CORT 2.7X40 STAR T8 EVOS (Screw) IMPLANT
SCREW CORT 3.5X16 ST EVOS (Screw) IMPLANT
SCREW CORT 3.5X22 ST EVOS (Screw) IMPLANT
SCREW CORTEX 3.5X24MM (Screw) IMPLANT
SCREW CTX 3.5X50MM EVOS (Screw) IMPLANT
SCREW EVOS 2.7 X 50 LCK T8 S-T (Screw) IMPLANT
SCREW LOCK EVOS 2.7X44 (Screw) IMPLANT
SCREW LOCK EVOS 2.7X46 (Screw) IMPLANT
SCREW LOCK EVOS ST 3.5X16 (Screw) IMPLANT
SCREW LOCK ST EVOS 2.7X20 (Screw) IMPLANT
SCREW LOCK ST EVOS 2.7X22 (Screw) IMPLANT
SCREW LOCK ST EVOS 2.7X24 (Screw) IMPLANT
SCREW LOCK ST EVOS 3.5 X 26 (Screw) IMPLANT
SCREW LOCK ST EVOS 3.5X10 (Screw) IMPLANT
SCREW PT 6.5X105 46 (Screw) IMPLANT
SHEET MEDIUM DRAPE 40X70 STRL (DRAPES) ×1 IMPLANT
SLEEVE SCD COMPRESS KNEE MED (STOCKING) IMPLANT
SLING ARM FOAM STRAP LRG (SOFTGOODS) ×1 IMPLANT
SPIKE FLUID TRANSFER (MISCELLANEOUS) IMPLANT
SPLINT PLASTER CAST FAST 5X30 (CAST SUPPLIES) ×10 IMPLANT
SPONGE T-LAP 18X18 ~~LOC~~+RFID (SPONGE) ×1 IMPLANT
SUCTION TUBE FRAZIER 10FR DISP (SUCTIONS) IMPLANT
SUT ETHILON 3 0 PS 1 (SUTURE) IMPLANT
SUT FIBERWIRE #2 38 T-5 BLUE (SUTURE) ×1
SUT MNCRL AB 4-0 PS2 18 (SUTURE) IMPLANT
SUT VIC AB 0 CT1 27 (SUTURE) ×4
SUT VIC AB 0 CT1 27XBRD ANBCTR (SUTURE) ×1 IMPLANT
SUT VIC AB 1 CT1 27 (SUTURE)
SUT VIC AB 1 CT1 27XBRD ANBCTR (SUTURE) IMPLANT
SUT VIC AB 2-0 CT1 27 (SUTURE)
SUT VIC AB 2-0 CT1 TAPERPNT 27 (SUTURE) IMPLANT
SUT VIC AB 2-0 SH 27 (SUTURE) ×1
SUT VIC AB 2-0 SH 27XBRD (SUTURE) ×1 IMPLANT
SUT VIC AB 3-0 SH 27 (SUTURE) ×3
SUT VIC AB 3-0 SH 27X BRD (SUTURE) IMPLANT
SUTURE FIBERWR #2 38 T-5 BLUE (SUTURE) IMPLANT
TAP CANN 6.5 (TAP) IMPLANT
TOWEL GREEN STERILE FF (TOWEL DISPOSABLE) ×1 IMPLANT
TUBE CONNECTING 20X1/4 (TUBING) ×1 IMPLANT
TUBE SUCTION HIGH CAP CLEAR NV (SUCTIONS) ×1 IMPLANT
VASCULAR TIE MAXI BLUE 18IN ST (MISCELLANEOUS)
WASHER CANN 12.7 STRL (Washer) IMPLANT

## 2023-07-14 NOTE — Anesthesia Procedure Notes (Addendum)
Anesthesia Regional Block: Supraclavicular block   Pre-Anesthetic Checklist: , timeout performed,  Correct Patient, Correct Site, Correct Laterality,  Correct Procedure, Correct Position, site marked,  Risks and benefits discussed,  Surgical consent,  Pre-op evaluation,  At surgeon's request and post-op pain management  Laterality: Upper and Left  Prep: chloraprep       Needles:  Injection technique: Single-shot  Needle Type: Stimulator Needle - 40     Needle Length: 4cm  Needle Gauge: 22     Additional Needles:   Procedures:,,,, ultrasound used (permanent image in chart),,    Narrative:  Start time: 07/14/2023 10:41 AM End time: 07/14/2023 11:01 AM Injection made incrementally with aspirations every 5 mL.  Performed by: Personally  Anesthesiologist: Lewie Loron, MD  Additional Notes: BP cuff, SpO2 and EKG monitors applied. Sedation begun. Nerve location verified with ultrasound. Anesthetic injected incrementally, slowly, and after neg aspirations under direct u/s guidance. Good perineural spread. Tolerated well.

## 2023-07-14 NOTE — Interval H&P Note (Signed)
All questions answered, specific risks of non-union and failure as well as nerve issues discussed.

## 2023-07-14 NOTE — Anesthesia Procedure Notes (Signed)
Procedure Name: Intubation Date/Time: 07/14/2023 11:43 AM  Performed by: Lauralyn Primes, CRNAPre-anesthesia Checklist: Patient identified, Emergency Drugs available, Suction available and Patient being monitored Patient Re-evaluated:Patient Re-evaluated prior to induction Oxygen Delivery Method: Circle system utilized Preoxygenation: Pre-oxygenation with 100% oxygen Induction Type: IV induction Ventilation: Mask ventilation without difficulty and Oral airway inserted - appropriate to patient size Laryngoscope Size: Mac and 3 Grade View: Grade I Tube type: Oral Tube size: 7.0 mm Number of attempts: 1 Airway Equipment and Method: Stylet and Oral airway Placement Confirmation: ETT inserted through vocal cords under direct vision, positive ETCO2 and breath sounds checked- equal and bilateral Secured at: 22 cm Tube secured with: Tape Dental Injury: Teeth and Oropharynx as per pre-operative assessment

## 2023-07-14 NOTE — Progress Notes (Signed)
Assisted Dr. Renold Don with left, supraclavicular, ultrasound guided block. Side rails up, monitors on throughout procedure. See vital signs in flow sheet. Tolerated Procedure well.

## 2023-07-14 NOTE — Transfer of Care (Signed)
Immediate Anesthesia Transfer of Care Note  Patient: Bethany Wolfe  Procedure(s) Performed: OPEN REDUCTION INTERNAL FIXATION (ORIF) DISTAL HUMERUS FRACTURE (Left: Shoulder) ANTERIOR INTEROSSEOUS NERVE DECOMPRESSION (Left: Shoulder)  Patient Location: PACU  Anesthesia Type:GA combined with regional for post-op pain  Level of Consciousness: drowsy  Airway & Oxygen Therapy: Patient Spontanous Breathing and Patient connected to face mask oxygen  Post-op Assessment: Report given to RN and Post -op Vital signs reviewed and stable  Post vital signs: Reviewed and stable  Last Vitals:  Vitals Value Taken Time  BP 132/70 07/14/23 1410  Temp 36.2 C 07/14/23 1410  Pulse 79 07/14/23 1410  Resp 14 07/14/23 1410  SpO2 98 % 07/14/23 1410    Last Pain:  Vitals:   07/14/23 1021  TempSrc: Oral  PainSc: 10-Worst pain ever      Patients Stated Pain Goal: 5 (07/14/23 1021)  Complications: No notable events documented.

## 2023-07-14 NOTE — Anesthesia Preprocedure Evaluation (Addendum)
Anesthesia Evaluation  Patient identified by MRN, date of birth, ID band Patient awake    Reviewed: Allergy & Precautions, NPO status , Patient's Chart, lab work & pertinent test results  Airway Mallampati: III  TM Distance: >3 FB Neck ROM: Full    Dental  (+) Dental Advisory Given, Edentulous Upper, Edentulous Lower   Pulmonary asthma , former smoker   Pulmonary exam normal breath sounds clear to auscultation       Cardiovascular hypertension, + CAD  Normal cardiovascular exam Rhythm:Regular Rate:Normal     Neuro/Psych negative neurological ROS     GI/Hepatic negative GI ROS, Neg liver ROS,,,  Endo/Other  negative endocrine ROS    Renal/GU negative Renal ROS     Musculoskeletal  (+) Arthritis ,    Abdominal  (+) + obese  Peds  Hematology negative hematology ROS (+)   Anesthesia Other Findings   Reproductive/Obstetrics                             Anesthesia Physical Anesthesia Plan  ASA: 3  Anesthesia Plan: General   Post-op Pain Management: Tylenol PO (pre-op)* and Regional block*   Induction: Intravenous  PONV Risk Score and Plan: 3 and Ondansetron, Dexamethasone and Treatment may vary due to age or medical condition  Airway Management Planned: Oral ETT  Additional Equipment:   Intra-op Plan:   Post-operative Plan: Extubation in OR  Informed Consent: I have reviewed the patients History and Physical, chart, labs and discussed the procedure including the risks, benefits and alternatives for the proposed anesthesia with the patient or authorized representative who has indicated his/her understanding and acceptance.     Dental advisory given  Plan Discussed with: CRNA  Anesthesia Plan Comments:         Anesthesia Quick Evaluation

## 2023-07-14 NOTE — Op Note (Signed)
Orthopaedic Surgery Operative Note (CSN: [id number])  Bethany Wolfe  September 25, 1941 Date of Surgery: 07/14/2023   Diagnoses:  Left intra-articular distal humeral fracture with ulnar neuritis  Procedure: Left distal humerus intra-articular supracondylar open duction trial fixation Left ulnar nerve decompression and neurolysis   Operative Finding Successful completion of the planned procedure.  Patient's bone quality was extremely poor.  There was a central trochlear split and there were separate fragments of essentially the far medial and far lateral epicondyles.  We used a position screw to hold our general reduction and then once the articular reduction was reasonable we placed posterior lateral direct medial plates.  We had good fixation of the proximal segment however there is significant comminution and loss of bone proximally and we had to except slight varus alignment in order to get cortical apposition.  We did get multiple screws in the distal segments.  Patient is at extremely high risk of failure secondary to bone quality and body habitus.  I do not think a splint will help the patient as due to her arm shape and size it would likely lead to levering just above the fracture site.  I instead think that a sling and therapy in 2 weeks or so for range of motion be appropriate.  Post-operative plan: The patient will be discharged home.  The patient will be nonweightbearing in a sling for 2 weeks with the range of motion to start after that, depending on patient's abilities we may be able to discontinue the sling if she is able to follow instruction.  DVT prophylaxis Aspirin 81 mg twice daily for 6 weeks.   Pain control with PRN pain medication preferring oral medicines.  Follow up plan will be scheduled in approximately 7 days for incision check and XR.  Post-Op Diagnosis: Same Surgeons:Primary: Bethany Pippin, MD Assistants:Bethany McBane PA-C Location: [facility] OR ROOM 6 Anesthesia: General with  regional anesthesia Antibiotics: Ancef 2 g with local vancomycin powder 1 g at the surgical site Tourniquet time:  Estimated Blood Loss: 150 Complications: None Specimens: None Implants: Implant Name Type Inv. Item Serial No. Manufacturer Lot No. LRB No. Used Action  WASHER CANN 12.7 STRL - [id number] Washer WASHER CANN 12.7 STRL  [employer] [id number] Left 1 Implanted  SCREW PT 6.5X105 46 - [id number] Screw SCREW PT 6.5X105 46  [employer] STERILIZED IN TRAY Left 1 Implanted  [employer] 3.5 x 50 cortex screw     STERILIZED IN TRAY Left 1 Implanted  [employer] 6 HOLE PLATE LEFT PLD HUMERAL     STERILIZED IN TRAY Left 1 Implanted  SCREW CORT 3.5X16 ST EVOS - [id number] Screw SCREW CORT 3.5X16 ST EVOS  [employer] STERILIZED IN TRAY Left 1 Implanted  SM[employer]W 2.7 X 22 LOCKING SCREW     STERILIZED IN TRAY Left 1 Implanted  SM[employer]W 2.7 X 34 LOCKING SCREW     STERILIZED IN TRAY Left 1 Implanted  SM[employer]W 2.7 X 20 LOCK SCREW     STERILIZED IN TRAY Left 1 Implanted  SCREW CORTEX 3.5X24MM - [id number] Screw SCREW CORTEX 3.5X24MM  [employer] ON STERILE TRAY Left 1 Implanted  MEDIAL DISTAL HUMMERUS PLATE 5 HOLE--LEFT    [employer] ON STERILE TRAY Left 1 Implanted  SCREW LOCK ST EVOS 3.5X10 - [id number] Screw SCREW LOCK ST EVOS 3.5X10  [employer] ON STERILE TRAY Left 1 Implanted  SCREW LOCK EVOS ST 3.5X16 - [id number] Screw SCREW LOCK EVOS ST 3.5X16  [employer] ON STERILE TRAY Left 1 Implanted  SCREW CORT 3.5X22 ST EVOS - [id number] Screw SCREW CORT 3.5X22 ST EVOS  [employer] ON STERILE TRAY Left 1 Implanted  2.7 x 46 locking screw    [employer] ON STERILE TRAY Left 2 Implanted  2.7 x 44 locking screw    [employer] ON STERILE TRAY Left 1 Implanted  2.7 x 40 locking screw    SM[employer]  ORTHOPEDICS ON STERILE TRAY Left 1 Implanted  2.7 x 50 locking screw    [employer]TH[employer] ON STERILE TRAY Left 1 Implanted  SMITH AND NE[employer]W 3.5 X 26 LOCKING SCREW     STERILIZED IN TRAY Left 1 Implanted  SCREW CORT 3.5X22 ST EVOS - [id number] Screw SCREW CORT 3.5X22 ST EVOS  [employer] STERILIZED IN TRAY Left 1 Implanted    Indications for Surgery:   Bethany Wolfe is a 82 y.o. female with fall resulting in a intra-articular supracondylar distal humerus fracture with ulnar neuritis.  Benefits and risks of operative and nonoperative management were discussed prior to surgery with patient/guardian(s) and informed consent form was completed.  Specific risks including infection, need for additional surgery, loss of fixation, stiffness, nerve injury, neurovascular damage, heterotopic bone amongst others.   Procedure:   The patient was identified properly. Informed consent was obtained and the surgical site was marked. The patient was taken up to suite where general anesthesia was induced.  The patient was positioned lateral on a beanbag with arm over a bolster.  The left humerus was prepped and draped in the usual sterile fashion.  Timeout was performed before the beginning of the case.  We began a posterior approach to the distal humerus.  With the skin sharp achieving hemostasis we progressed.  Once we had identified the triceps we made full-thickness medial lateral flaps.  We initially began with a ulnar nerve neurolysis.  We found a proximally and dissected distally preserving all branches.  Once it was carefully dissected we released the medial antebrachial septum to allow better visualization of the medial fragments.  We then placed self-retaining retractors and started olecranon osteotomy.  We placed a guidewire from the [facility] & Nephew 6.5 mm set.  Once it was appropriately placed we drilled and tapped 205 mm to obtain a relative size.  At that point we were able to  use fluoroscopy to ensure that we are in the correct position.  We made an osteotomy in a chevron form of the olecranon.  Chevron osteotomy was then used to raise medial lateral thyroid tricipital flaps and was clipped to the drapes to allow visualization of the distal intra-articular fracture.  Fracture itself was highly comminuted with an intra-articular split in multiple anterior clary fragments that were not able to be reduced.  We were able to get an articular reduction that was anatomic at the distal anterior segment however there were relatively distal fracture fragments.  I felt that obtaining provisional fixation with multiple K wires was appropriate.  We then placed a position 3.5 millimeter screw across the articular school to hold it provisionally.  At that point we simplify the fracture multiple K wires and were able to obtain a reasonable reduction.  We placed a posterior lateral plate from [facility] and were able to dilate our reduction using the nonlocking screws.  Once this was complete we placed multiple locking screws distal as well as nonlocking screws and locked screws proximally.  We selected a 5 hole medial plate and were able to use the multidirectional locking screws from the medial side to obtain purchase across the condyles and into the capitellar segment.  The bone quality was extremely poor.  We used a series of orthogonal x-rays to ensure that were not in the articular surface.  We had achieved good proximal and distal fixation on both the medial and lateral plate.  We had checked our orthogonal images to ensure there is no obvious screw perforation and took the elbow through range of motion to ensure there is reasonable fixation and motion.  At that point we irrigated and reduced her chevron osteotomy.  We placed a perpendicular 2 mm tunnel in passed a FiberWire suture through it.  This was used for tension band construct.  We wrapped this around her 6.5 x 105 cannulated  screw.  We were able to use this to tie down and create a figure-of-eight tension band.  We had great fixation with her cannulated screw and anatomic reduction of the chevron osteotomy.  We took final images and then closed incision multilayer fashion finishing resorbable suture in the deep layers and nonabsorbable nylon in the superficial layers.  We did sided that a splint was not in the patient's best interest as her body habitus would likely lead the splint to and just above the fracture.  We instead used a bulky dressing and sling.  Patient was awoken taken to PACU in stable condition.  Bethany Alpers, PA-C, present and scrubbed throughout the case, critical for completion in a timely fashion, and for retraction, instrumentation, closure.

## 2023-07-15 NOTE — Anesthesia Postprocedure Evaluation (Signed)
Anesthesia Post Note  Patient: Bethany Wolfe  Procedure(s) Performed: OPEN REDUCTION INTERNAL FIXATION (ORIF) DISTAL HUMERUS FRACTURE (Left: Shoulder) ANTERIOR INTEROSSEOUS NERVE DECOMPRESSION (Left: Shoulder)     Patient location during evaluation: PACU Anesthesia Type: General Level of consciousness: sedated and patient cooperative Pain management: pain level controlled Vital Signs Assessment: post-procedure vital signs reviewed and stable Respiratory status: spontaneous breathing Cardiovascular status: stable Anesthetic complications: no   No notable events documented.  Last Vitals:  Vitals:   07/14/23 1625 07/14/23 1634  BP:  135/74  Pulse: 85 90  Resp:  18  Temp:  (!) 36.3 C  SpO2: 92% 92%    Last Pain:  Vitals:   07/15/23 0942  TempSrc:   PainSc: 0-No pain                 Lewie Loron

## 2023-07-19 ENCOUNTER — Encounter (HOSPITAL_BASED_OUTPATIENT_CLINIC_OR_DEPARTMENT_OTHER): Payer: Self-pay | Admitting: Orthopaedic Surgery

## 2023-07-22 DIAGNOSIS — G5622 Lesion of ulnar nerve, left upper limb: Secondary | ICD-10-CM | POA: Diagnosis not present

## 2023-08-09 DIAGNOSIS — S42402D Unspecified fracture of lower end of left humerus, subsequent encounter for fracture with routine healing: Secondary | ICD-10-CM | POA: Diagnosis not present

## 2023-08-11 ENCOUNTER — Ambulatory Visit (INDEPENDENT_AMBULATORY_CARE_PROVIDER_SITE_OTHER): Payer: PPO | Admitting: Family Medicine

## 2023-08-11 ENCOUNTER — Encounter: Payer: Self-pay | Admitting: Family Medicine

## 2023-08-11 VITALS — BP 134/81 | HR 84 | Ht 65.0 in | Wt 257.0 lb

## 2023-08-11 DIAGNOSIS — R600 Localized edema: Secondary | ICD-10-CM

## 2023-08-11 DIAGNOSIS — J4520 Mild intermittent asthma, uncomplicated: Secondary | ICD-10-CM

## 2023-08-11 DIAGNOSIS — E559 Vitamin D deficiency, unspecified: Secondary | ICD-10-CM | POA: Diagnosis not present

## 2023-08-11 DIAGNOSIS — I10 Essential (primary) hypertension: Secondary | ICD-10-CM | POA: Diagnosis not present

## 2023-08-11 DIAGNOSIS — J4541 Moderate persistent asthma with (acute) exacerbation: Secondary | ICD-10-CM | POA: Diagnosis not present

## 2023-08-11 MED ORDER — ALBUTEROL SULFATE HFA 108 (90 BASE) MCG/ACT IN AERS
2.0000 | INHALATION_SPRAY | Freq: Four times a day (QID) | RESPIRATORY_TRACT | 5 refills | Status: DC | PRN
Start: 2023-08-11 — End: 2024-02-02

## 2023-08-11 MED ORDER — ALBUTEROL SULFATE 1.25 MG/3ML IN NEBU: 1.0000 | INHALATION_SOLUTION | Freq: Four times a day (QID) | RESPIRATORY_TRACT | 12 refills | Status: DC | PRN

## 2023-08-11 NOTE — Assessment & Plan Note (Signed)
Patient reports vitamin D deficiency.  Not currently on vitamin D.  No levels in our health record.  Will get vitamin D level today.

## 2023-08-11 NOTE — Patient Instructions (Addendum)
It was nice to see you today,  We addressed the following topics today: -I would like you to check your blood pressure once or twice a day at the same time each day and write down those values.  Bring them back to me when you see me again in 1 month. - I will order a test to check for heart failure as a cause of your leg swelling.  I will also check your vitamin D level - When you find out who you would like to go to for physical therapy let us know and we can send in a referral to that place - If you are having anxiety or fear about following it may be best to talk to a therapist about this.  You can search for local therapist by going on [email or web address] and typing in your ZIP Code.  You can also call your insurance company to see if they have a specific therapist they would recommend or who are covered. - You can use compression stockings to help with leg swelling.  They make devices that help you put on compression stockings.  These are usually found next to the compression stockings at the pharmacy.  Have a great day,  Frederic Jericho, MD

## 2023-08-11 NOTE — Assessment & Plan Note (Signed)
Uses albuterol inhaler a few times a week.  1-2 exacerbations a year recently.  Has never used anything else other than albuterol.  Refill albuterol inhaler and nebulizer today.

## 2023-08-11 NOTE — Progress Notes (Signed)
Established Patient Office Visit  Subjective   Patient ID: Bethany Wolfe, female    DOB: Apr 18, 1941  Age: 82 y.o. MRN: [id number]  Chief Complaint  Patient presents with  . Foot Swelling    HPI Patient is here today accompanied by her husband.  She was originally scheduled to see me in September but fell and broke her left arm.  She has subsequently had surgery and saw her orthopedist 2 days ago most recently.  Currently in a sling and only advised to lift 1 pound or less in the left hand.  While at the orthopedist she states they told her to discuss with me the swelling in her legs.  Patient states that she started noticing leg swelling after she fell.  Feels like it has been getting worse.  She currently sleeps in a recliner because of her fractured arm.  Does feel more short of breath when she reclines back further.  Does feel like she is getting more short of breath with exertion.  Patient takes her albuterol nebulizer and inhaler as needed.  Generally 2-3 times a week she needs to use her albuterol.  Typically has had 3-4 exacerbations a year but recently it has been less than that.  Patient feels like she his having anxiety or panic attacks regarding the possibility of falling.  Husband states she is more unsteady on her feet recently.  She does endorse fear of falling again.  Discussed with her adding physical therapy for her legs in addition to the physical therapy she is going to receive for her arm.  She was going to go to her physical therapist and [facility] but they are temporarily closed she will let us know when she finds a new physical therapy location.  Patient was on lisinopril for several decades until her previous PCP retired.  She then stopped taking it for about a year prior to establishing with [place].  At that time she told the PCP she was having blood pressures in the 120s most of the time, she has not since been put back on blood pressure medication.  We discussed  her blood pressure being elevated today in the office.  Discussed checking at home, documenting it, and returning in a month to talk about her blood pressure readings and decide if new blood pressure medication is needed.  Patient states she has a history of vitamin D deficiency.  We discussed that her most recent labs did not have vitamin D level in it and I did not see a vitamin D level in the past.  Will get repeat vitamin D level.   The ASCVD Risk score (Arnett DK, et al., 2019) failed to calculate for the following reasons:   The 2019 ASCVD risk score is only valid for ages 70 to 41  Health Maintenance Due  Topic Date Due  . DTaP/Tdap/Td (1 - Tdap) Never done  . Zoster Vaccines- Shingrix (1 of 2) Never done  . INFLUENZA VACCINE  Never done  . COVID-19 Vaccine (3 - 2023-24 season) 06/26/2023      Objective:     BP 134/81   Pulse 84   Ht 5\' 5"  (1.651 m)   Wt 257 lb (116.6 kg)   SpO2 98%   BMI 42.77 kg/m    Physical Exam General: Alert and oriented CV: Regular rhythm Pulmonary: Lungs are bilaterally Extremities: Left arm in a sling.  Some swelling of the left hand.  Bilateral lower extremities have 1+ pitting edema.  No results found for any visits on 08/11/23.      Assessment & Plan:   Moderate persistent asthma with acute exacerbation -     Albuterol Sulfate; Take 3 mLs (1.25 mg total) by nebulization every 6 (six) hours as needed for wheezing.  Dispense: 75 mL; Refill: 12 -     Albuterol Sulfate HFA; Inhale 2 puffs into the lungs every 6 (six) hours as needed for wheezing or shortness of breath.  Dispense: 1 each; Refill: 5  Essential hypertension Assessment & Plan: Was on lisinopril for several years.  Stopped taking it when her previous PCP retired and has not taken it since reestablishing care with [place].  Elevated today.  Patient endorses systolics in the 120s to 130s at home.  Advised her to document these and return in 1 month so we can discuss the values.   If she needs blood pressure medication consider HCTZ or other thiazide diuretic given her leg swelling.   Leg edema Assessment & Plan: Patient reports that it has only been present for the past 1 to 2 months.  Endorses some questionable orthopnea.  Some mild dyspnea on exertion.  Will check BNP today.  Encourage patient to use compression stockings.  Orders: -     Brain natriuretic peptide  Vitamin D deficiency Assessment & Plan: Patient reports vitamin D deficiency.  Not currently on vitamin D.  No levels in our health record.  Will get vitamin D level today.  Orders: -     VITAMIN D 25 Hydroxy (Vit-D Deficiency, Fractures)  Mild intermittent asthma without complication Assessment & Plan: Uses albuterol inhaler a few times a week.  1-2 exacerbations a year recently.  Has never used anything else other than albuterol.  Refill albuterol inhaler and nebulizer today.      Return in about 4 weeks (around 09/08/2023) for HTN.    Sandre Kitty, MD

## 2023-08-11 NOTE — Assessment & Plan Note (Signed)
Was on lisinopril for several years.  Stopped taking it when her previous PCP retired and has not taken it since reestablishing care with [place].  Elevated today.  Patient endorses systolics in the 120s to 130s at home.  Advised her to document these and return in 1 month so we can discuss the values.  If she needs blood pressure medication consider HCTZ or other thiazide diuretic given her leg swelling.

## 2023-08-11 NOTE — Assessment & Plan Note (Signed)
Patient reports that it has only been present for the past 1 to 2 months.  Endorses some questionable orthopnea.  Some mild dyspnea on exertion.  Will check BNP today.  Encourage patient to use compression stockings.

## 2023-08-12 LAB — VITAMIN D 25 HYDROXY (VIT D DEFICIENCY, FRACTURES): Vit D, 25-Hydroxy: 16.8 ng/mL — ABNORMAL LOW (ref 30.0–100.0)

## 2023-08-12 LAB — BRAIN NATRIURETIC PEPTIDE: BNP: 38.4 pg/mL (ref 0.0–100.0)

## 2023-08-15 ENCOUNTER — Other Ambulatory Visit: Payer: Self-pay | Admitting: Family Medicine

## 2023-08-15 MED ORDER — VITAMIN D (ERGOCALCIFEROL) 1.25 MG (50000 UNIT) PO CAPS: 50000.0000 [IU] | ORAL_CAPSULE | ORAL | 0 refills | Status: DC

## 2023-08-18 ENCOUNTER — Ambulatory Visit: Payer: PPO | Admitting: Nurse Practitioner

## 2023-08-22 ENCOUNTER — Other Ambulatory Visit: Payer: Self-pay | Admitting: Family Medicine

## 2023-08-22 ENCOUNTER — Telehealth: Payer: Self-pay | Admitting: *Deleted

## 2023-08-22 DIAGNOSIS — M25522 Pain in left elbow: Secondary | ICD-10-CM | POA: Diagnosis not present

## 2023-08-22 DIAGNOSIS — M25422 Effusion, left elbow: Secondary | ICD-10-CM | POA: Diagnosis not present

## 2023-08-22 DIAGNOSIS — R2681 Unsteadiness on feet: Secondary | ICD-10-CM | POA: Insufficient documentation

## 2023-08-22 DIAGNOSIS — M25622 Stiffness of left elbow, not elsewhere classified: Secondary | ICD-10-CM | POA: Diagnosis not present

## 2023-08-22 NOTE — Telephone Encounter (Signed)
Pt calling to let provider know that she would like the referral for PT to go to [facility] on [address]. I did not see a current referral for this.  Please place the referral and what you are wanting her to get done and I will fax it to the location that she provided.

## 2023-08-22 NOTE — Telephone Encounter (Signed)
Order for PT placed.

## 2023-08-23 NOTE — Telephone Encounter (Signed)
Referral faxed to [facility].

## 2023-08-26 DIAGNOSIS — M25422 Effusion, left elbow: Secondary | ICD-10-CM | POA: Diagnosis not present

## 2023-08-26 DIAGNOSIS — M25622 Stiffness of left elbow, not elsewhere classified: Secondary | ICD-10-CM | POA: Diagnosis not present

## 2023-08-26 DIAGNOSIS — M25522 Pain in left elbow: Secondary | ICD-10-CM | POA: Diagnosis not present

## 2023-08-29 DIAGNOSIS — M25622 Stiffness of left elbow, not elsewhere classified: Secondary | ICD-10-CM | POA: Diagnosis not present

## 2023-08-29 DIAGNOSIS — M25522 Pain in left elbow: Secondary | ICD-10-CM | POA: Diagnosis not present

## 2023-08-29 DIAGNOSIS — M25422 Effusion, left elbow: Secondary | ICD-10-CM | POA: Diagnosis not present

## 2023-08-31 DIAGNOSIS — M25422 Effusion, left elbow: Secondary | ICD-10-CM | POA: Diagnosis not present

## 2023-08-31 DIAGNOSIS — M25522 Pain in left elbow: Secondary | ICD-10-CM | POA: Diagnosis not present

## 2023-08-31 DIAGNOSIS — M25622 Stiffness of left elbow, not elsewhere classified: Secondary | ICD-10-CM | POA: Diagnosis not present

## 2023-09-05 DIAGNOSIS — I803 Phlebitis and thrombophlebitis of lower extremities, unspecified: Secondary | ICD-10-CM | POA: Diagnosis not present

## 2023-09-05 DIAGNOSIS — M25422 Effusion, left elbow: Secondary | ICD-10-CM | POA: Diagnosis not present

## 2023-09-05 DIAGNOSIS — M25522 Pain in left elbow: Secondary | ICD-10-CM | POA: Diagnosis not present

## 2023-09-05 DIAGNOSIS — M25622 Stiffness of left elbow, not elsewhere classified: Secondary | ICD-10-CM | POA: Diagnosis not present

## 2023-09-06 DIAGNOSIS — S42402D Unspecified fracture of lower end of left humerus, subsequent encounter for fracture with routine healing: Secondary | ICD-10-CM | POA: Diagnosis not present

## 2023-09-07 DIAGNOSIS — M25522 Pain in left elbow: Secondary | ICD-10-CM | POA: Diagnosis not present

## 2023-09-07 DIAGNOSIS — M25622 Stiffness of left elbow, not elsewhere classified: Secondary | ICD-10-CM | POA: Diagnosis not present

## 2023-09-07 DIAGNOSIS — M25422 Effusion, left elbow: Secondary | ICD-10-CM | POA: Diagnosis not present

## 2023-09-08 ENCOUNTER — Encounter: Payer: Self-pay | Admitting: Family Medicine

## 2023-09-08 ENCOUNTER — Ambulatory Visit (INDEPENDENT_AMBULATORY_CARE_PROVIDER_SITE_OTHER): Payer: PPO | Admitting: Family Medicine

## 2023-09-08 VITALS — BP 137/85 | HR 81 | Ht 65.0 in | Wt 264.4 lb

## 2023-09-08 DIAGNOSIS — I10 Essential (primary) hypertension: Secondary | ICD-10-CM

## 2023-09-08 DIAGNOSIS — F410 Panic disorder [episodic paroxysmal anxiety] without agoraphobia: Secondary | ICD-10-CM | POA: Insufficient documentation

## 2023-09-08 DIAGNOSIS — R2681 Unsteadiness on feet: Secondary | ICD-10-CM | POA: Diagnosis not present

## 2023-09-08 DIAGNOSIS — R600 Localized edema: Secondary | ICD-10-CM

## 2023-09-08 MED ORDER — FUROSEMIDE 20 MG PO TABS: 20.0000 mg | ORAL_TABLET | Freq: Every day | ORAL | 0 refills | Status: DC

## 2023-09-08 NOTE — Assessment & Plan Note (Signed)
BNP was negative.  Due to venous stasis.  Swelling is pretty significant with some redness in the legs bilaterally.  Was not able to get compression socks on because of the swelling.  Discussed short-term use of furosemide to help reduce swelling so she can wear her compression socks.  Discussed possible side effects including dizziness lightheadedness.  Discussed how it would make her urinate more frequently.

## 2023-09-08 NOTE — Progress Notes (Signed)
Established Patient Office Visit  Subjective   Patient ID: Bethany Wolfe, female    DOB: Mar 08, 1941  Age: 82 y.o. MRN: [id number]  Chief Complaint  Patient presents with   Medical Management of Chronic Issues    HPI  Patient states that physical therapy is going well.  They have started working on her balance.  They are going to try to help her get accustomed to laying back in a reclined position again.  Hypertension-patient brought in her home bp values.  We discussed how these are mostly in her normal range.  Vitamin D-patient taking her vitamin D.  States that when she started taking it noticed an improvement in her energy.  Patient complaining of continued leg swelling.  She got some compression socks and  was not able to fit them over her legs due to the swelling.  We discussed taking Lasix for a short while to help with the swelling enough so that she can get compression socks on.  Part of the issue is she is also not elevating her legs frequently due to fear of laying down since her fall.  Patient recently went to the urgent care because of the concerns of redness and swelling in her legs bilaterally.  Was given doxycycline out of precaution but cellulitis was not suspected.  Discussed how venous stasis can cause dermatitis.  Discussed using a bunkie board instead of a box for a mattress on her bed so that the height of the bed is not so high.   The ASCVD Risk score (Arnett DK, et al., 2019) failed to calculate for the following reasons:   The 2019 ASCVD risk score is only valid for ages 55 to 5  Health Maintenance Due  Topic Date Due   DTaP/Tdap/Td (1 - Tdap) Never done   Zoster Vaccines- Shingrix (1 of 2) Never done   COVID-19 Vaccine (3 - 2023-24 season) 06/26/2023      Objective:     BP 137/85   Pulse 81   Ht 5\' 5"  (1.651 m)   Wt 264 lb 6.4 oz (119.9 kg)   SpO2 99%   BMI 44.00 kg/m    Physical Exam General: Alert, oriented Extremities: 2+ pedal edema  bilaterally.  Some redness in the lower extremities bilaterally.   No results found for any visits on 09/08/23.      Assessment & Plan:   Unsteady gait Assessment & Plan: Working with physical therapy for this and her fractured arm.   Leg edema Assessment & Plan: BNP was negative.  Due to venous stasis.  Swelling is pretty significant with some redness in the legs bilaterally.  Was not able to get compression socks on because of the swelling.  Discussed short-term use of furosemide to help reduce swelling so she can wear her compression socks.  Discussed possible side effects including dizziness lightheadedness.  Discussed how it would make her urinate more frequently.   Essential hypertension Assessment & Plan: Patient brought in home readings since our last visit.  All of these show diastolic levels in the 70s.  Diastolic ranges from high 120s to mid 140s with most of the readings being in the 130s range.  For her age, patient is at goal.   Panic attacks Assessment & Plan: Since her fall patient has not been laying down horizontally.  She be comes fearful and feels like she is suffocating when she tries to lay down.  BNP was normal.  Unlikely to be related to cardiopulmonary  disease.  More likely panic associated with fear of falling.  Patient states physical therapy is going to work with her on this.  Recommended patient use a low-profile box bring so she does not have to climb up in the bed.  If the patient still has issues the next time we see her regarding this, can discuss medications for panic episodes such as gabapentin low-dose.   Other orders -     Furosemide; Take 1 tablet (20 mg total) by mouth daily.  Dispense: 15 tablet; Refill: 0   I spent 32 minutes in the management of this patient.  Return in about 2 months (around 11/08/2023) for Vitamin D, leg swelling.    Sandre Kitty, MD

## 2023-09-08 NOTE — Patient Instructions (Addendum)
It was nice to see you today,  We addressed the following topics today: -I have prescribed Lasix for you to take for the next few weeks.  Hopefully this will help with swelling enough so that you can get your compression socks on. - If you feel dizzy when taking the Lasix I would stop taking it and let us know. - We will follow-up again in approximately 2 months to recheck your vitamin D level.  Have a great day,  Frederic Jericho, MD

## 2023-09-08 NOTE — Assessment & Plan Note (Signed)
Working with physical therapy for this and her fractured arm.

## 2023-09-08 NOTE — Assessment & Plan Note (Addendum)
Since her fall patient has not been laying down horizontally.  She be comes fearful and feels like she is suffocating when she tries to lay down.  BNP was normal.  Unlikely to be related to cardiopulmonary disease.  More likely panic associated with fear of falling.  Patient states physical therapy is going to work with her on this.  Recommended patient use a low-profile box bring so she does not have to climb up in the bed.  If the patient still has issues the next time we see her regarding this, can discuss medications for panic episodes such as gabapentin low-dose.

## 2023-09-08 NOTE — Assessment & Plan Note (Signed)
Patient brought in home readings since our last visit.  All of these show diastolic levels in the 70s.  Diastolic ranges from high 120s to mid 140s with most of the readings being in the 130s range.  For her age, patient is at goal.

## 2023-09-12 DIAGNOSIS — M25422 Effusion, left elbow: Secondary | ICD-10-CM | POA: Diagnosis not present

## 2023-09-12 DIAGNOSIS — M25522 Pain in left elbow: Secondary | ICD-10-CM | POA: Diagnosis not present

## 2023-09-12 DIAGNOSIS — M25622 Stiffness of left elbow, not elsewhere classified: Secondary | ICD-10-CM | POA: Diagnosis not present

## 2023-09-14 DIAGNOSIS — M25622 Stiffness of left elbow, not elsewhere classified: Secondary | ICD-10-CM | POA: Diagnosis not present

## 2023-09-14 DIAGNOSIS — M25422 Effusion, left elbow: Secondary | ICD-10-CM | POA: Diagnosis not present

## 2023-09-14 DIAGNOSIS — M25522 Pain in left elbow: Secondary | ICD-10-CM | POA: Diagnosis not present

## 2023-09-16 ENCOUNTER — Other Ambulatory Visit: Payer: Self-pay | Admitting: Family Medicine

## 2023-09-19 DIAGNOSIS — M25522 Pain in left elbow: Secondary | ICD-10-CM | POA: Diagnosis not present

## 2023-09-19 DIAGNOSIS — M25622 Stiffness of left elbow, not elsewhere classified: Secondary | ICD-10-CM | POA: Diagnosis not present

## 2023-09-19 DIAGNOSIS — M25422 Effusion, left elbow: Secondary | ICD-10-CM | POA: Diagnosis not present

## 2023-09-26 DIAGNOSIS — M25422 Effusion, left elbow: Secondary | ICD-10-CM | POA: Diagnosis not present

## 2023-09-26 DIAGNOSIS — M25622 Stiffness of left elbow, not elsewhere classified: Secondary | ICD-10-CM | POA: Diagnosis not present

## 2023-09-26 DIAGNOSIS — M25522 Pain in left elbow: Secondary | ICD-10-CM | POA: Diagnosis not present

## 2023-09-28 ENCOUNTER — Ambulatory Visit: Payer: Self-pay | Admitting: Family Medicine

## 2023-09-28 ENCOUNTER — Telehealth: Payer: Self-pay | Admitting: Family Medicine

## 2023-09-28 DIAGNOSIS — M25622 Stiffness of left elbow, not elsewhere classified: Secondary | ICD-10-CM | POA: Diagnosis not present

## 2023-09-28 DIAGNOSIS — M25422 Effusion, left elbow: Secondary | ICD-10-CM | POA: Diagnosis not present

## 2023-09-28 DIAGNOSIS — M25522 Pain in left elbow: Secondary | ICD-10-CM | POA: Diagnosis not present

## 2023-09-28 NOTE — Telephone Encounter (Signed)
Pt is wanting to know if she needs to be seen for her legs are swollen tender to the touch legs she was told she needs to be referred to a cardiovascular by pt she needed to know what she needs to do she would like a call back to know what's the nest steps for her

## 2023-09-28 NOTE — Telephone Encounter (Signed)
It looks like she talk to somebody and they scheduled her to see someone at an urgent care tomorrow.  She should see them to make sure it is not cellulitis.  After that I can send in a referral to the vascular surgeon

## 2023-09-28 NOTE — Telephone Encounter (Signed)
   Chief Complaint: leg swelling Symptoms: BL leg swelling, pain to touch Frequency: since September Pertinent Negatives: Patient denies fever, chest pain, difficulty breathing, drainage Disposition: [] ED /[x] Urgent Care (no appt availability in office) / [] Appointment(In office/virtual)/ []  [facility]/ [] Home Care/ [] Refused Recommended Disposition /[] [facility]/ []  Follow-up with PCP Additional Notes: Patient reports she is having bilateral leg swelling since September. Patient reports the swelling goes above her knee on both legs and is painful to touch, but not painful at rest. Patient has had this problem in the past and was prescribed doxycycline and a water pill. Per protocol, this RN attempted to schedule in office appt today, but no available until 12/16. This RN offered UC, patient request UC appt tomorrow 12/5. Patient advised to call back for worsening symptoms. Patient verbalized understanding.     Copied from CRM [id number]. Topic: Clinical - Red Word Triage >> Sep 28, 2023  4:21 PM Cassiday T wrote: Red Word that prompted transfer to Nurse Triage: patient is calling in regarding her swollen legs hot to the touch and tender to the touch legs Reason for Disposition  SEVERE leg swelling (e.g., swelling extends above knee, entire leg is swollen, weeping fluid)  Answer Assessment - Initial Assessment Questions 1. ONSET: "When did the swelling start?" (e.g., minutes, hours, days)     Started after surgery in september 2. LOCATION: "What part of the leg is swollen?"  "Are both legs swollen or just one leg?"     both 3. SEVERITY: "How bad is the swelling?" (e.g., localized; mild, moderate, severe)   - Localized: Small area of swelling localized to one leg.   - MILD pedal edema: Swelling limited to foot and ankle, pitting edema < 1/4 inch (6 mm) deep, rest and elevation eliminate most or all swelling.   - MODERATE edema: Swelling of lower leg to knee, pitting edema  > 1/4 inch (6 mm) deep, rest and elevation only partially reduce swelling.   - SEVERE edema: Swelling extends above knee, facial or hand swelling present.      Severe, above knee both legs 4. REDNESS: "Does the swelling look red or infected?"     Dry and flaky, white dots, splotchy pink 5. PAIN: "Is the swelling painful to touch?" If Yes, ask: "How painful is it?"   (Scale 1-10; mild, moderate or severe)     Severe when touched, none when not touching 6. FEVER: "Do you have a fever?" If Yes, ask: "What is it, how was it measured, and when did it start?"      no 7. CAUSE: "What do you think is causing the leg swelling?"     Hx of edema 8. MEDICAL HISTORY: "Do you have a history of blood clots (e.g., DVT), cancer, heart failure, kidney disease, or liver failure?"     no 9. RECURRENT SYMPTOM: "Have you had leg swelling before?" If Yes, ask: "When was the last time?" "What happened that time?"     Has had this previously and was prescribed doxycycline & water pill 10. OTHER SYMPTOMS: "Do you have any other symptoms?" (e.g., chest pain, difficulty breathing)       Pain when touching  Protocols used: Leg Swelling and Edema-A-AH

## 2023-09-29 ENCOUNTER — Ambulatory Visit: Payer: Self-pay

## 2023-09-30 ENCOUNTER — Encounter: Payer: Self-pay | Admitting: Emergency Medicine

## 2023-09-30 ENCOUNTER — Other Ambulatory Visit: Payer: Self-pay

## 2023-09-30 ENCOUNTER — Ambulatory Visit
Admission: RE | Admit: 2023-09-30 | Discharge: 2023-09-30 | Disposition: A | Discharge status: Home / Self Care | Payer: PPO | Source: Ambulatory Visit

## 2023-09-30 ENCOUNTER — Ambulatory Visit
Admission: RE | Admit: 2023-09-30 | Discharge: 2023-09-30 | Disposition: A | Discharge status: Home / Self Care | Payer: PPO

## 2023-09-30 DIAGNOSIS — R600 Localized edema: Secondary | ICD-10-CM

## 2023-09-30 NOTE — Discharge Instructions (Addendum)
  I think that your leg swelling is edema and less likely an infection.   Continue lasix as prescribed and try to elevate legs as much as possible.   We have reached out to your primary care provider regarding referral to vascular specialist and apologize that we are unable to provide that referral in urgent care setting.

## 2023-09-30 NOTE — ED Triage Notes (Signed)
Pt reports bilateral leg pain with swelling since a fall in Oct 2024. Pt reports being seen at an urgent care in [place] and treated with doxycycline for suspected cellulitis. Pt reports no improvement in condition with medication. Pt's PCP could not get her in to be seen and recommended urgent care. Per pt, her PCP thinks she needs a vascular referral.

## 2023-09-30 NOTE — ED Notes (Addendum)
Per request of the patient we have reached out to her PCP as follows:

## 2023-10-03 DIAGNOSIS — M25522 Pain in left elbow: Secondary | ICD-10-CM | POA: Diagnosis not present

## 2023-10-03 DIAGNOSIS — M25422 Effusion, left elbow: Secondary | ICD-10-CM | POA: Diagnosis not present

## 2023-10-03 DIAGNOSIS — M25622 Stiffness of left elbow, not elsewhere classified: Secondary | ICD-10-CM | POA: Diagnosis not present

## 2023-10-05 NOTE — ED Provider Notes (Signed)
Patient presents today for evaluation of bilateral leg swelling.  She is already on Lasix prescribed by her primary care provider.  She has also been treated to cover cellulitis without resolution.  She denies any fever.  Her PCP had recommended she have referral to vascular, and recommended she come here for same per patient.  Unable to provide referral in urgent care setting and discussing with patient and recommended she discuss with primary care provider.  No acute complaints today, patient had presented solely for referral.   Tomi Bamberger, PA-C 10/05/23 [phone]

## 2023-10-06 DIAGNOSIS — M25522 Pain in left elbow: Secondary | ICD-10-CM | POA: Diagnosis not present

## 2023-10-06 DIAGNOSIS — M25622 Stiffness of left elbow, not elsewhere classified: Secondary | ICD-10-CM | POA: Diagnosis not present

## 2023-10-06 DIAGNOSIS — M25422 Effusion, left elbow: Secondary | ICD-10-CM | POA: Diagnosis not present

## 2023-10-10 DIAGNOSIS — M25522 Pain in left elbow: Secondary | ICD-10-CM | POA: Diagnosis not present

## 2023-10-10 DIAGNOSIS — M25422 Effusion, left elbow: Secondary | ICD-10-CM | POA: Diagnosis not present

## 2023-10-10 DIAGNOSIS — M25622 Stiffness of left elbow, not elsewhere classified: Secondary | ICD-10-CM | POA: Diagnosis not present

## 2023-10-12 DIAGNOSIS — M25522 Pain in left elbow: Secondary | ICD-10-CM | POA: Diagnosis not present

## 2023-10-12 DIAGNOSIS — M25622 Stiffness of left elbow, not elsewhere classified: Secondary | ICD-10-CM | POA: Diagnosis not present

## 2023-10-12 DIAGNOSIS — M25422 Effusion, left elbow: Secondary | ICD-10-CM | POA: Diagnosis not present

## 2023-10-17 DIAGNOSIS — M25522 Pain in left elbow: Secondary | ICD-10-CM | POA: Diagnosis not present

## 2023-10-17 DIAGNOSIS — M25422 Effusion, left elbow: Secondary | ICD-10-CM | POA: Diagnosis not present

## 2023-10-17 DIAGNOSIS — M25622 Stiffness of left elbow, not elsewhere classified: Secondary | ICD-10-CM | POA: Diagnosis not present

## 2023-10-23 DIAGNOSIS — L03115 Cellulitis of right lower limb: Secondary | ICD-10-CM | POA: Diagnosis not present

## 2023-10-23 DIAGNOSIS — I87311 Chronic venous hypertension (idiopathic) with ulcer of right lower extremity: Secondary | ICD-10-CM | POA: Diagnosis not present

## 2023-10-24 DIAGNOSIS — M25622 Stiffness of left elbow, not elsewhere classified: Secondary | ICD-10-CM | POA: Diagnosis not present

## 2023-10-24 DIAGNOSIS — M25522 Pain in left elbow: Secondary | ICD-10-CM | POA: Diagnosis not present

## 2023-10-24 DIAGNOSIS — M25422 Effusion, left elbow: Secondary | ICD-10-CM | POA: Diagnosis not present

## 2023-10-28 DIAGNOSIS — S42402D Unspecified fracture of lower end of left humerus, subsequent encounter for fracture with routine healing: Secondary | ICD-10-CM | POA: Diagnosis not present

## 2023-11-01 ENCOUNTER — Telehealth (HOSPITAL_COMMUNITY): Payer: Self-pay

## 2023-11-01 NOTE — Telephone Encounter (Signed)
 Made in error

## 2023-11-02 DIAGNOSIS — M25522 Pain in left elbow: Secondary | ICD-10-CM | POA: Diagnosis not present

## 2023-11-02 DIAGNOSIS — M25422 Effusion, left elbow: Secondary | ICD-10-CM | POA: Diagnosis not present

## 2023-11-02 DIAGNOSIS — M25622 Stiffness of left elbow, not elsewhere classified: Secondary | ICD-10-CM | POA: Diagnosis not present

## 2023-11-07 DIAGNOSIS — M25522 Pain in left elbow: Secondary | ICD-10-CM | POA: Diagnosis not present

## 2023-11-07 DIAGNOSIS — M25422 Effusion, left elbow: Secondary | ICD-10-CM | POA: Diagnosis not present

## 2023-11-07 DIAGNOSIS — M25622 Stiffness of left elbow, not elsewhere classified: Secondary | ICD-10-CM | POA: Diagnosis not present

## 2023-11-08 ENCOUNTER — Ambulatory Visit (INDEPENDENT_AMBULATORY_CARE_PROVIDER_SITE_OTHER): Payer: PPO | Admitting: Family Medicine

## 2023-11-08 ENCOUNTER — Encounter: Payer: Self-pay | Admitting: Family Medicine

## 2023-11-08 VITALS — BP 149/82 | HR 79 | Ht 65.0 in | Wt 255.0 lb

## 2023-11-08 DIAGNOSIS — I10 Essential (primary) hypertension: Secondary | ICD-10-CM | POA: Diagnosis not present

## 2023-11-08 DIAGNOSIS — R600 Localized edema: Secondary | ICD-10-CM

## 2023-11-08 DIAGNOSIS — E559 Vitamin D deficiency, unspecified: Secondary | ICD-10-CM | POA: Diagnosis not present

## 2023-11-08 DIAGNOSIS — E66813 Obesity, class 3: Secondary | ICD-10-CM

## 2023-11-08 DIAGNOSIS — Z6841 Body Mass Index (BMI) 40.0 and over, adult: Secondary | ICD-10-CM

## 2023-11-08 NOTE — Progress Notes (Signed)
 Established Patient Office Visit  Subjective   Patient ID: Bethany Wolfe, female    DOB: Apr 19, 1941  Age: 83 y.o. MRN: [id number]  Chief Complaint  Patient presents with   Medical Management of Chronic Issues    HPI  Patient still doing physical therapy.  Not currently taking it for the fracture, but is doing it for shoulder pain and weakness.  Is unable to lift her shoulders past parallel.  States that if she fails physical therapy her orthopedist will consider a local injection or other treatments.  Leg swelling-patient states that she is taking furosemide  as needed.  Cannot take it on days when she is going somewhere due to the increased urination.  Takes it about twice a week now.  Has not been able to find a device that will help her put on her compression socks.  Discussed looking at [facility] medical supply.  She does not want to take a thiazide diuretic because she is concerned about low blood pressure leading to fainting.  Patient still sleeping in a recliner because she is worried about falling out of bed.  When she tries to lay flat's still feels like she cannot catch her breath.  Only has some dyspnea on exertion when the weather is cold.  No dyspnea at rest.  Patient finished taking her vitamin D  on the first Tuesday in January.  Agreeable to rechecking today.  Patient recently went to [facility] urgent care on [place] on December 30 and was treated with amoxicillin for a rash on her leg.  Not currently taking it.   The ASCVD Risk score (Arnett DK, et al., 2019) failed to calculate for the following reasons:   The 2019 ASCVD risk score is only valid for ages 27 to 31  Health Maintenance Due  Topic Date Due   DTaP/Tdap/Td (1 - Tdap) Never done   Zoster Vaccines- Shingrix (1 of 2) Never done   COVID-19 Vaccine (3 - 2024-25 season) 06/26/2023      Objective:     BP (!) 149/82   Pulse 79   Ht 5' 5 (1.651 m)   Wt 255 lb (115.7 kg)   SpO2 97%   BMI 42.43 kg/m     Physical Exam  General: Alert, oriented pulmonary: No respiratory distress Extremities: Bilateral swelling with bilateral erythema and dry scaly skin.  Right anterior medial ankle shows ovoid shaped eschar.   No results found for any visits on 11/08/23.      Assessment & Plan:   Vitamin D  deficiency Assessment & Plan: Repeat vitamin D  testing today.  Refill vitamin D  supplement if needed.  Orders: -     VITAMIN D  25 Hydroxy (Vit-D Deficiency, Fractures)  Class 3 severe obesity due to excess calories with serious comorbidity and body mass index (BMI) of 40.0 to 44.9 in adult [facility]) Assessment & Plan: Currently in physical rehab for arm fracture.  Exercise is limited due to this.  Continue to encourage healthy eating, maintaining a healthy weight.   Leg edema Assessment & Plan: Likely venous stasis.  BNP was normal.  Previous imaging of the heart did not show any cardiomegaly.  Her complaint of orthopnea is more likely related to her recent fall and subsequent phobia of falling out of bed.  Declines thiazide due to concern for effect on blood pressure.  Uses furosemide  as needed.  Encouraged her to use compression stockings.  Showed her where to get donning assistive device.   Essential hypertension Assessment & Plan:  Blood pressure elevated again in our office.  Previous home readings were all better after our last visit.  Patient is hesitant to start any medication that could lower her blood pressure and potentially cause a syncopal or presyncopal episode due to fear fracturing her arm again.  Continue to monitor.      Return in about 3 months (around 02/06/2024) for vit d, leg swelling.    Toribio MARLA Slain, MD

## 2023-11-08 NOTE — Assessment & Plan Note (Signed)
 Currently in physical rehab for arm fracture.  Exercise is limited due to this.  Continue to encourage healthy eating, maintaining a healthy weight.

## 2023-11-08 NOTE — Assessment & Plan Note (Signed)
 Likely venous stasis.  BNP was normal.  Previous imaging of the heart did not show any cardiomegaly.  Her complaint of orthopnea is more likely related to her recent fall and subsequent phobia of falling out of bed.  Declines thiazide due to concern for effect on blood pressure.  Uses furosemide  as needed.  Encouraged her to use compression stockings.  Showed her where to get donning assistive device.

## 2023-11-08 NOTE — Patient Instructions (Addendum)
 It was nice to see you today,  We addressed the following topics today: -I am rechecking vitamin D .  If it is low again I will resend in some more vitamin D  prescriptions - You can go to adult medical supply on [address] to see if they have the compression sock assistive device.  I have provided some pictures of them below - Use Vaseline on your legs as needed for help with the dry skin.  Avoid putting in on the bottom of your foot as it can be slippery.  Have a great day,  Rolan Slain, MD

## 2023-11-08 NOTE — Assessment & Plan Note (Signed)
 Blood pressure elevated again in our office.  Previous home readings were all better after our last visit.  Patient is hesitant to start any medication that could lower her blood pressure and potentially cause a syncopal or presyncopal episode due to fear fracturing her arm again.  Continue to monitor.

## 2023-11-08 NOTE — Assessment & Plan Note (Signed)
 Repeat vitamin D testing today.  Refill vitamin D supplement if needed.

## 2023-11-09 ENCOUNTER — Other Ambulatory Visit: Payer: Self-pay | Admitting: Family Medicine

## 2023-11-09 DIAGNOSIS — M25422 Effusion, left elbow: Secondary | ICD-10-CM | POA: Diagnosis not present

## 2023-11-09 DIAGNOSIS — M25522 Pain in left elbow: Secondary | ICD-10-CM | POA: Diagnosis not present

## 2023-11-09 DIAGNOSIS — M25622 Stiffness of left elbow, not elsewhere classified: Secondary | ICD-10-CM | POA: Diagnosis not present

## 2023-11-09 LAB — VITAMIN D 25 HYDROXY (VIT D DEFICIENCY, FRACTURES): Vit D, 25-Hydroxy: 39.5 ng/mL (ref 30.0–100.0)

## 2023-11-09 MED ORDER — VITAMIN D3 50 MCG (2000 UT) PO CAPS
2000.0000 [IU] | ORAL_CAPSULE | Freq: Every day | ORAL | 3 refills | Status: AC
  Filled 2024-06-13: qty 90, 90d supply, fill #0

## 2023-11-14 DIAGNOSIS — M25422 Effusion, left elbow: Secondary | ICD-10-CM | POA: Diagnosis not present

## 2023-11-14 DIAGNOSIS — M25522 Pain in left elbow: Secondary | ICD-10-CM | POA: Diagnosis not present

## 2023-11-14 DIAGNOSIS — M25622 Stiffness of left elbow, not elsewhere classified: Secondary | ICD-10-CM | POA: Diagnosis not present

## 2023-11-21 DIAGNOSIS — M25422 Effusion, left elbow: Secondary | ICD-10-CM | POA: Diagnosis not present

## 2023-11-21 DIAGNOSIS — M25522 Pain in left elbow: Secondary | ICD-10-CM | POA: Diagnosis not present

## 2023-11-21 DIAGNOSIS — R531 Weakness: Secondary | ICD-10-CM | POA: Diagnosis not present

## 2023-11-21 DIAGNOSIS — M25622 Stiffness of left elbow, not elsewhere classified: Secondary | ICD-10-CM | POA: Diagnosis not present

## 2023-11-23 DIAGNOSIS — M25522 Pain in left elbow: Secondary | ICD-10-CM | POA: Diagnosis not present

## 2023-11-23 DIAGNOSIS — R531 Weakness: Secondary | ICD-10-CM | POA: Diagnosis not present

## 2023-11-23 DIAGNOSIS — M25422 Effusion, left elbow: Secondary | ICD-10-CM | POA: Diagnosis not present

## 2023-11-23 DIAGNOSIS — M25622 Stiffness of left elbow, not elsewhere classified: Secondary | ICD-10-CM | POA: Diagnosis not present

## 2023-11-28 DIAGNOSIS — M25622 Stiffness of left elbow, not elsewhere classified: Secondary | ICD-10-CM | POA: Diagnosis not present

## 2023-11-28 DIAGNOSIS — M25422 Effusion, left elbow: Secondary | ICD-10-CM | POA: Diagnosis not present

## 2023-11-28 DIAGNOSIS — R531 Weakness: Secondary | ICD-10-CM | POA: Diagnosis not present

## 2023-11-28 DIAGNOSIS — M25522 Pain in left elbow: Secondary | ICD-10-CM | POA: Diagnosis not present

## 2023-11-30 DIAGNOSIS — M25522 Pain in left elbow: Secondary | ICD-10-CM | POA: Diagnosis not present

## 2023-11-30 DIAGNOSIS — R531 Weakness: Secondary | ICD-10-CM | POA: Diagnosis not present

## 2023-11-30 DIAGNOSIS — M25622 Stiffness of left elbow, not elsewhere classified: Secondary | ICD-10-CM | POA: Diagnosis not present

## 2023-11-30 DIAGNOSIS — M25422 Effusion, left elbow: Secondary | ICD-10-CM | POA: Diagnosis not present

## 2023-12-05 DIAGNOSIS — M25522 Pain in left elbow: Secondary | ICD-10-CM | POA: Diagnosis not present

## 2023-12-05 DIAGNOSIS — M25422 Effusion, left elbow: Secondary | ICD-10-CM | POA: Diagnosis not present

## 2023-12-05 DIAGNOSIS — R531 Weakness: Secondary | ICD-10-CM | POA: Diagnosis not present

## 2023-12-05 DIAGNOSIS — M25622 Stiffness of left elbow, not elsewhere classified: Secondary | ICD-10-CM | POA: Diagnosis not present

## 2023-12-07 DIAGNOSIS — M25422 Effusion, left elbow: Secondary | ICD-10-CM | POA: Diagnosis not present

## 2023-12-07 DIAGNOSIS — R531 Weakness: Secondary | ICD-10-CM | POA: Diagnosis not present

## 2023-12-07 DIAGNOSIS — M25622 Stiffness of left elbow, not elsewhere classified: Secondary | ICD-10-CM | POA: Diagnosis not present

## 2023-12-07 DIAGNOSIS — M25522 Pain in left elbow: Secondary | ICD-10-CM | POA: Diagnosis not present

## 2023-12-13 DIAGNOSIS — R531 Weakness: Secondary | ICD-10-CM | POA: Diagnosis not present

## 2023-12-13 DIAGNOSIS — M25422 Effusion, left elbow: Secondary | ICD-10-CM | POA: Diagnosis not present

## 2023-12-13 DIAGNOSIS — M25622 Stiffness of left elbow, not elsewhere classified: Secondary | ICD-10-CM | POA: Diagnosis not present

## 2023-12-13 DIAGNOSIS — M25522 Pain in left elbow: Secondary | ICD-10-CM | POA: Diagnosis not present

## 2023-12-19 DIAGNOSIS — M25422 Effusion, left elbow: Secondary | ICD-10-CM | POA: Diagnosis not present

## 2023-12-19 DIAGNOSIS — R531 Weakness: Secondary | ICD-10-CM | POA: Diagnosis not present

## 2023-12-19 DIAGNOSIS — M25622 Stiffness of left elbow, not elsewhere classified: Secondary | ICD-10-CM | POA: Diagnosis not present

## 2023-12-19 DIAGNOSIS — M25522 Pain in left elbow: Secondary | ICD-10-CM | POA: Diagnosis not present

## 2023-12-21 DIAGNOSIS — M25622 Stiffness of left elbow, not elsewhere classified: Secondary | ICD-10-CM | POA: Diagnosis not present

## 2023-12-21 DIAGNOSIS — M25422 Effusion, left elbow: Secondary | ICD-10-CM | POA: Diagnosis not present

## 2023-12-21 DIAGNOSIS — R531 Weakness: Secondary | ICD-10-CM | POA: Diagnosis not present

## 2023-12-21 DIAGNOSIS — M25522 Pain in left elbow: Secondary | ICD-10-CM | POA: Diagnosis not present

## 2023-12-26 DIAGNOSIS — M25422 Effusion, left elbow: Secondary | ICD-10-CM | POA: Diagnosis not present

## 2023-12-26 DIAGNOSIS — M25622 Stiffness of left elbow, not elsewhere classified: Secondary | ICD-10-CM | POA: Diagnosis not present

## 2023-12-26 DIAGNOSIS — M25522 Pain in left elbow: Secondary | ICD-10-CM | POA: Diagnosis not present

## 2023-12-26 DIAGNOSIS — R531 Weakness: Secondary | ICD-10-CM | POA: Diagnosis not present

## 2023-12-28 DIAGNOSIS — M25422 Effusion, left elbow: Secondary | ICD-10-CM | POA: Diagnosis not present

## 2023-12-28 DIAGNOSIS — M25522 Pain in left elbow: Secondary | ICD-10-CM | POA: Diagnosis not present

## 2023-12-28 DIAGNOSIS — M25622 Stiffness of left elbow, not elsewhere classified: Secondary | ICD-10-CM | POA: Diagnosis not present

## 2023-12-28 DIAGNOSIS — R531 Weakness: Secondary | ICD-10-CM | POA: Diagnosis not present

## 2024-01-02 DIAGNOSIS — M25422 Effusion, left elbow: Secondary | ICD-10-CM | POA: Diagnosis not present

## 2024-01-02 DIAGNOSIS — M25522 Pain in left elbow: Secondary | ICD-10-CM | POA: Diagnosis not present

## 2024-01-02 DIAGNOSIS — R531 Weakness: Secondary | ICD-10-CM | POA: Diagnosis not present

## 2024-01-02 DIAGNOSIS — M25622 Stiffness of left elbow, not elsewhere classified: Secondary | ICD-10-CM | POA: Diagnosis not present

## 2024-01-04 DIAGNOSIS — M25622 Stiffness of left elbow, not elsewhere classified: Secondary | ICD-10-CM | POA: Diagnosis not present

## 2024-01-04 DIAGNOSIS — M25422 Effusion, left elbow: Secondary | ICD-10-CM | POA: Diagnosis not present

## 2024-01-04 DIAGNOSIS — M25522 Pain in left elbow: Secondary | ICD-10-CM | POA: Diagnosis not present

## 2024-01-04 DIAGNOSIS — R531 Weakness: Secondary | ICD-10-CM | POA: Diagnosis not present

## 2024-01-11 DIAGNOSIS — M25422 Effusion, left elbow: Secondary | ICD-10-CM | POA: Diagnosis not present

## 2024-01-11 DIAGNOSIS — M25622 Stiffness of left elbow, not elsewhere classified: Secondary | ICD-10-CM | POA: Diagnosis not present

## 2024-01-11 DIAGNOSIS — R531 Weakness: Secondary | ICD-10-CM | POA: Diagnosis not present

## 2024-01-11 DIAGNOSIS — M25522 Pain in left elbow: Secondary | ICD-10-CM | POA: Diagnosis not present

## 2024-01-16 DIAGNOSIS — R531 Weakness: Secondary | ICD-10-CM | POA: Diagnosis not present

## 2024-01-16 DIAGNOSIS — M25522 Pain in left elbow: Secondary | ICD-10-CM | POA: Diagnosis not present

## 2024-01-16 DIAGNOSIS — M25422 Effusion, left elbow: Secondary | ICD-10-CM | POA: Diagnosis not present

## 2024-01-16 DIAGNOSIS — M25622 Stiffness of left elbow, not elsewhere classified: Secondary | ICD-10-CM | POA: Diagnosis not present

## 2024-01-18 DIAGNOSIS — M25622 Stiffness of left elbow, not elsewhere classified: Secondary | ICD-10-CM | POA: Diagnosis not present

## 2024-01-18 DIAGNOSIS — R531 Weakness: Secondary | ICD-10-CM | POA: Diagnosis not present

## 2024-01-18 DIAGNOSIS — M25422 Effusion, left elbow: Secondary | ICD-10-CM | POA: Diagnosis not present

## 2024-01-18 DIAGNOSIS — M25522 Pain in left elbow: Secondary | ICD-10-CM | POA: Diagnosis not present

## 2024-01-23 DIAGNOSIS — R531 Weakness: Secondary | ICD-10-CM | POA: Diagnosis not present

## 2024-01-23 DIAGNOSIS — M25522 Pain in left elbow: Secondary | ICD-10-CM | POA: Diagnosis not present

## 2024-01-23 DIAGNOSIS — M25622 Stiffness of left elbow, not elsewhere classified: Secondary | ICD-10-CM | POA: Diagnosis not present

## 2024-01-23 DIAGNOSIS — M25422 Effusion, left elbow: Secondary | ICD-10-CM | POA: Diagnosis not present

## 2024-01-25 DIAGNOSIS — M25522 Pain in left elbow: Secondary | ICD-10-CM | POA: Diagnosis not present

## 2024-01-25 DIAGNOSIS — R531 Weakness: Secondary | ICD-10-CM | POA: Diagnosis not present

## 2024-01-25 DIAGNOSIS — M25622 Stiffness of left elbow, not elsewhere classified: Secondary | ICD-10-CM | POA: Diagnosis not present

## 2024-01-25 DIAGNOSIS — M25422 Effusion, left elbow: Secondary | ICD-10-CM | POA: Diagnosis not present

## 2024-02-02 ENCOUNTER — Other Ambulatory Visit: Payer: Self-pay | Admitting: Family Medicine

## 2024-02-02 DIAGNOSIS — J4541 Moderate persistent asthma with (acute) exacerbation: Secondary | ICD-10-CM

## 2024-02-06 ENCOUNTER — Ambulatory Visit (INDEPENDENT_AMBULATORY_CARE_PROVIDER_SITE_OTHER): Payer: PPO | Admitting: Family Medicine

## 2024-02-06 ENCOUNTER — Encounter: Payer: Self-pay | Admitting: Family Medicine

## 2024-02-06 VITALS — BP 145/78 | HR 78 | Ht 65.0 in | Wt 255.4 lb

## 2024-02-06 DIAGNOSIS — I10 Essential (primary) hypertension: Secondary | ICD-10-CM

## 2024-02-06 DIAGNOSIS — M25422 Effusion, left elbow: Secondary | ICD-10-CM | POA: Diagnosis not present

## 2024-02-06 DIAGNOSIS — R600 Localized edema: Secondary | ICD-10-CM

## 2024-02-06 DIAGNOSIS — E559 Vitamin D deficiency, unspecified: Secondary | ICD-10-CM

## 2024-02-06 DIAGNOSIS — M25622 Stiffness of left elbow, not elsewhere classified: Secondary | ICD-10-CM | POA: Diagnosis not present

## 2024-02-06 DIAGNOSIS — R531 Weakness: Secondary | ICD-10-CM | POA: Diagnosis not present

## 2024-02-06 DIAGNOSIS — M25522 Pain in left elbow: Secondary | ICD-10-CM | POA: Diagnosis not present

## 2024-02-06 MED ORDER — INDAPAMIDE 1.25 MG PO TABS: 1.2500 mg | ORAL_TABLET | Freq: Every day | ORAL | 1 refills | Status: DC

## 2024-02-06 NOTE — Assessment & Plan Note (Signed)
 Rechecking vitamin D today.  Taking daily over-the-counter supplement

## 2024-02-06 NOTE — Assessment & Plan Note (Signed)
 Wearing stockings that are not compression stockings but are tight fitting and provide some benefit.  For compression stockings so she is able to put on now with the use of a stocking donning device but cannot put her shoes on when wearing the compression socks.  She will try to get looser fitting shoes.  I am prescribing a thiazide today that may help with swelling as well.  Follow-up in 1 month for blood pressure.  Reassess at that time.

## 2024-02-06 NOTE — Assessment & Plan Note (Signed)
 Blood pressure again elevated.  Patient endorses elevated blood pressures at home.  Agreeable to starting a blood pressure medication this time.  Starting indapamide to help with her leg swelling.  Discussed side effects.  Checking BMP today.  Follow-up in 1 month, recheck at that time.

## 2024-02-06 NOTE — Progress Notes (Signed)
   Established Patient Office Visit  Subjective   Patient ID: Bethany Wolfe, female    DOB: [birth year]  Age: 83 y.o. MRN: [id number]  Chief Complaint  Patient presents with   Medical Management of Chronic Issues    HPI  Patient here for follow-up of leg swelling, high blood pressure and vitamin D deficiency.  She is taking her vitamin D supplementation daily now.  For her leg swelling she is wearing some tight fitting knee-high stockings that are not compression stockings but her compression stockings did not fit inside her shoes.  These helped somewhat with her leg swelling.  Patient's blood pressure readings at home have been elevated.  At home most recently it was 170 systolic.  They have all been in the 140s or higher recently.  She is agreeable to starting a blood pressure medicine now.  We discussed thiazides and stopping the Lasix while she takes thiazide.  She only takes Lasix occasionally now because if she takes it she cannot leave the house due to frequent urination.   The ASCVD Risk score (Arnett DK, et al., 2019) failed to calculate for the following reasons:   The 2019 ASCVD risk score is only valid for ages 22 to 38  Health Maintenance Due  Topic Date Due   COVID-19 Vaccine (3 - 2024-25 season) 06/26/2023   Medicare Annual Wellness (AWV)  03/02/2024      Objective:     BP (!) 145/78   Pulse 78   Ht 5\' 5"  (1.651 m)   Wt 255 lb 6.4 oz (115.8 kg)   SpO2 98%   BMI 42.50 kg/m    Physical Exam General: Alert, oriented Pulmonary: No respiratory distress Remedies: Mild lower extremity edema bilaterally.   No results found for any visits on 02/06/24.      Assessment & Plan:   Essential hypertension Assessment & Plan: Blood pressure again elevated.  Patient endorses elevated blood pressures at home.  Agreeable to starting a blood pressure medication this time.  Starting indapamide to help with her leg swelling.  Discussed side effects.  Checking BMP  today.  Follow-up in 1 month, recheck at that time.  Orders: -     Basic metabolic panel with GFR  Vitamin D deficiency Assessment & Plan: Rechecking vitamin D today.  Taking daily over-the-counter supplement  Orders: -     VITAMIN D 25 Hydroxy (Vit-D Deficiency, Fractures)  Leg edema Assessment & Plan: Wearing stockings that are not compression stockings but are tight fitting and provide some benefit.  For compression stockings so she is able to put on now with the use of a stocking donning device but cannot put her shoes on when wearing the compression socks.  She will try to get looser fitting shoes.  I am prescribing a thiazide today that may help with swelling as well.  Follow-up in 1 month for blood pressure.  Reassess at that time.   Other orders -     Indapamide; Take 1 tablet (1.25 mg total) by mouth daily.  Dispense: 30 tablet; Refill: 1     No follow-ups on file.    Laneta Pintos, MD

## 2024-02-06 NOTE — Patient Instructions (Addendum)
 It was nice to see you today,  We addressed the following topics today: -I have prescribed a blood pressure medicine called indapamide that is a mild diuretic.  It should help a little bit with your leg swelling.  It might make you urinate more frequently but not as bad as the Lasix. - Do not take the Lasix when you are taking the indapamide.  Take the indapamide every day. - Continue to check your blood pressure at home.  Please document it and when you see us  again we can go over the results at home.  Your blood pressure should at least be under 140/90-I am checking your vitamin D and your kidney and potassium levels.  I will let you know results when I get them.   Have a great day,  Etha Henle, MD

## 2024-02-07 LAB — VITAMIN D 25 HYDROXY (VIT D DEFICIENCY, FRACTURES): Vit D, 25-Hydroxy: 29 ng/mL — ABNORMAL LOW (ref 30.0–100.0)

## 2024-02-07 LAB — BASIC METABOLIC PANEL WITH GFR
BUN/Creatinine Ratio: 22 (ref 12–28)
BUN: 17 mg/dL (ref 8–27)
CO2: 27 mmol/L (ref 20–29)
Calcium: 10 mg/dL (ref 8.7–10.3)
Chloride: 105 mmol/L (ref 96–106)
Creatinine, Ser: 0.77 mg/dL (ref 0.57–1.00)
Glucose: 101 mg/dL — ABNORMAL HIGH (ref 70–99)
Potassium: 4.6 mmol/L (ref 3.5–5.2)
Sodium: 144 mmol/L (ref 134–144)
eGFR: 76 mL/min/{1.73_m2} (ref >59)

## 2024-02-08 DIAGNOSIS — M25622 Stiffness of left elbow, not elsewhere classified: Secondary | ICD-10-CM | POA: Diagnosis not present

## 2024-02-08 DIAGNOSIS — R531 Weakness: Secondary | ICD-10-CM | POA: Diagnosis not present

## 2024-02-08 DIAGNOSIS — M25522 Pain in left elbow: Secondary | ICD-10-CM | POA: Diagnosis not present

## 2024-02-08 DIAGNOSIS — M25422 Effusion, left elbow: Secondary | ICD-10-CM | POA: Diagnosis not present

## 2024-02-13 DIAGNOSIS — R262 Difficulty in walking, not elsewhere classified: Secondary | ICD-10-CM | POA: Diagnosis not present

## 2024-02-13 DIAGNOSIS — R531 Weakness: Secondary | ICD-10-CM | POA: Diagnosis not present

## 2024-02-15 DIAGNOSIS — R262 Difficulty in walking, not elsewhere classified: Secondary | ICD-10-CM | POA: Diagnosis not present

## 2024-02-15 DIAGNOSIS — R531 Weakness: Secondary | ICD-10-CM | POA: Diagnosis not present

## 2024-02-29 ENCOUNTER — Other Ambulatory Visit: Payer: Self-pay | Admitting: Family Medicine

## 2024-03-08 ENCOUNTER — Ambulatory Visit: Admitting: Family Medicine

## 2024-03-11 ENCOUNTER — Other Ambulatory Visit: Payer: Self-pay | Admitting: Family Medicine

## 2024-03-22 ENCOUNTER — Ambulatory Visit (INDEPENDENT_AMBULATORY_CARE_PROVIDER_SITE_OTHER): Payer: PPO

## 2024-03-22 DIAGNOSIS — Z0000 Encounter for general adult medical examination without abnormal findings: Secondary | ICD-10-CM

## 2024-03-22 NOTE — Patient Instructions (Signed)
 Bethany Wolfe , Thank you for taking time out of your busy schedule to complete your Annual Wellness Visit with me. I enjoyed our conversation and look forward to speaking with you again next year. I, as well as your care team,  appreciate your ongoing commitment to your health goals. Please review the following plan we discussed and let me know if I can assist you in the future. Your Game plan/ To Do List    Referrals: If you haven't heard from the office you've been referred to, please reach out to them at the phone provided.  N/a Follow up Visits: Next Medicare AWV with our clinical staff: 04/18/2025 at 11:30   Have you seen your provider in the last 6 months (3 months if uncontrolled diabetes)? Yes Next Office Visit with your provider: 05/03/2024 at 1:30  Clinician Recommendations:  Aim for 30 minutes of exercise or brisk walking, 6-8 glasses of water, and 5 servings of fruits and vegetables each day.       This is a list of the screening recommended for you and due dates:  Health Maintenance  Topic Date Due   COVID-19 Vaccine (3 - 2024-25 season) 06/26/2023   Zoster (Shingles) Vaccine (1 of 2) 05/07/2024*   DTaP/Tdap/Td vaccine (1 - Tdap) 02/05/2025*   Flu Shot  05/25/2024   Medicare Annual Wellness Visit  03/22/2025   Pneumonia Vaccine  Completed   DEXA scan (bone density measurement)  Completed   HPV Vaccine  Aged Out   Meningitis B Vaccine  Aged Out  *Topic was postponed. The date shown is not the original due date.    Advanced directives: (Copy Requested) Please bring a copy of your health care power of attorney and living will to the office to be added to your chart at your convenience. You can mail to [facility] [address] [place], [place] [address] or email to [email or web address] Advance Care Planning is important because it:  [x]  Makes sure you receive the medical care that is consistent with your values, goals, and preferences  [x]  It provides  guidance to your family and loved ones and reduces their decisional burden about whether or not they are making the right decisions based on your wishes.  Follow the link provided in your after visit summary or read over the paperwork we have mailed to you to help you started getting your Advance Directives in place. If you need assistance in completing these, please reach out to us  so that we can help you!  See attachments for Preventive Care and Fall Prevention Tips.

## 2024-03-22 NOTE — Progress Notes (Signed)
 Subjective:   Bethany Wolfe is a 83 y.o. who presents for a Medicare Wellness preventive visit.  As a reminder, Annual Wellness Visits don't include a physical exam, and some assessments may be limited, especially if this visit is performed virtually. We may recommend an in-person follow-up visit with your provider if needed.  Visit Complete: Virtual I connected with  Bethany Wolfe on 03/22/24 by a audio enabled telemedicine application and verified that I am speaking with the correct person using two identifiers.  Patient Location: Home  Provider Location: Home Office  I discussed the limitations of evaluation and management by telemedicine. The patient expressed understanding and agreed to proceed.  Vital Signs: Because this visit was a virtual/telehealth visit, some criteria may be missing or patient reported. Any vitals not documented were not able to be obtained and vitals that have been documented are patient reported.  VideoError- Librarian, academic were attempted between this provider and patient, however failed, due to patient having technical difficulties OR patient did not have access to video capability.  We continued and completed visit with audio only.   Persons Participating in Visit: Patient.  AWV Questionnaire: No: Patient Medicare AWV questionnaire was not completed prior to this visit.  Cardiac Risk Factors include: advanced age (>60men, >28 women);dyslipidemia;hypertension     Objective:     Today's Vitals   There is no height or weight on file to calculate BMI.     03/22/2024   10:17 AM 07/08/2023   12:02 PM 07/08/2023    7:17 AM 03/03/2023   10:05 AM 01/31/2021   10:04 PM  Advanced Directives  Does Patient Have a Medical Advance Directive? Yes Yes No Yes No  Type of Estate agent of [place];Living will Healthcare Power of [place];Living will  Healthcare Power of [place];Living will   Copy of  Healthcare Power of Attorney in Chart? No - copy requested   No - copy requested   Would patient like information on creating a medical advance directive?     No - Patient declined    Current Medications (verified) Outpatient Encounter Medications as of 03/22/2024  Medication Sig   albuterol  (ACCUNEB ) 1.25 MG/3ML nebulizer solution Take 3 mLs (1.25 mg total) by nebulization every 6 (six) hours as needed for wheezing.   albuterol  (VENTOLIN  HFA) 108 (90 Base) MCG/ACT inhaler TAKE 2 PUFFS BY MOUTH EVERY 6 HOURS AS NEEDED FOR WHEEZE OR SHORTNESS OF BREATH   Cholecalciferol (VITAMIN D3) 50 MCG (2000 UT) capsule Take 1 capsule (2,000 Units total) by mouth daily.   ibuprofen  (ADVIL ) 800 MG tablet Take 800 mg by mouth 3 (three) times daily.   indapamide  (LOZOL ) 1.25 MG tablet TAKE 1 TABLET BY MOUTH DAILY.   No facility-administered encounter medications on file as of 03/22/2024.    Allergies (verified) Alendronate sodium, Codeine, Lovastatin, Penicillin g, Pravastatin, Tetanus toxoid, Penicillins, and Tetanus toxoids   History: Past Medical History:  Diagnosis Date   Asthma    Humerus fracture    left   Past Surgical History:  Procedure Laterality Date   ABDOMINAL HYSTERECTOMY     ANTERIOR INTEROSSEOUS NERVE DECOMPRESSION Left 07/14/2023   Procedure: ANTERIOR INTEROSSEOUS NERVE DECOMPRESSION;  Surgeon: Micheline Ahr, MD;  Location: [facility];  Service: Orthopedics;  Laterality: Left;   COLONOSCOPY     ORIF HUMERUS FRACTURE Left 07/14/2023   Procedure: OPEN REDUCTION INTERNAL FIXATION (ORIF) DISTAL HUMERUS FRACTURE;  Surgeon: Micheline Ahr, MD;  Location: [facility];  Service: Orthopedics;  Laterality: Left;   History reviewed. No pertinent family history. Social History   Socioeconomic History   Marital status: Married    Spouse name: Not on file   Number of children: Not on file   Years of education: Not on file   Highest education level: Not on file   Occupational History   Not on file  Tobacco Use   Smoking status: Former    Types: Cigarettes   Smokeless tobacco: Never  Vaping Use   Vaping status: Never Used  Substance and Sexual Activity   Alcohol use: Never   Drug use: Never   Sexual activity: Not Currently  Other Topics Concern   Not on file  Social History Narrative   Not on file   Social Drivers of Health   Financial Resource Strain: Low Risk  (03/22/2024)   Overall Financial Resource Strain (CARDIA)    Difficulty of Paying Living Expenses: Not hard at all  Food Insecurity: No Food Insecurity (03/22/2024)   Hunger Vital Sign    Worried About Running Out of Food in the Last Year: Never true    Ran Out of Food in the Last Year: Never true  Transportation Needs: No Transportation Needs (03/22/2024)   PRAPARE - Administrator, Civil Service (Medical): No    Lack of Transportation (Non-Medical): No  Physical Activity: Inactive (03/22/2024)   Exercise Vital Sign    Days of Exercise per Week: 0 days    Minutes of Exercise per Session: 0 min  Stress: No Stress Concern Present (03/22/2024)   [employer] of Occupational Health - Occupational Stress Questionnaire    Feeling of Stress : Not at all  Social Connections: Moderately Isolated (03/22/2024)   Social Connection and Isolation Panel [NHANES]    Frequency of Communication with Friends and Family: More than three times a week    Frequency of Social Gatherings with Friends and Family: More than three times a week    Attends Religious Services: Never    Database administrator or Organizations: No    Attends Engineer, structural: Never    Marital Status: Married    Tobacco Counseling Counseling given: Not Answered    Clinical Intake:  Pre-visit preparation completed: Yes  Pain : No/denies pain     Nutritional Risks: None Diabetes: No  Lab Results  Component Value Date   HGBA1C 5.7 (H) 07/07/2023   HGBA1C 5.8 (H) 11/05/2021      How often do you need to have someone help you when you read instructions, pamphlets, or other written materials from your doctor or pharmacy?: 1 - Never  Interpreter Needed?: No  Information entered by :: NAllen LPN   Activities of Daily Living     03/22/2024   10:12 AM 07/14/2023   10:19 AM  In your present state of health, do you have any difficulty performing the following activities:  Hearing? 0 0  Vision? 0 0  Difficulty concentrating or making decisions? 0 0  Walking or climbing stairs? 0 1  Dressing or bathing? 0 1  Doing errands, shopping? 0   Preparing Food and eating ? N   Using the Toilet? N   In the past six months, have you accidently leaked urine? Y   Do you have problems with loss of bowel control? N   Managing your Medications? N   Managing your Finances? N   Housekeeping or managing your Housekeeping? N  Patient Care Team: Laneta Pintos, MD as PCP - General (Family Medicine)  Indicate any recent Medical Services you may have received from other than [facility] providers in the past year (date may be approximate).     Assessment:    This is a routine wellness examination for Bethany Wolfe.  Hearing/Vision screen Hearing Screening - Comments:: Denies hearing issues Vision Screening - Comments:: Regular eye exams, [facility]   Goals Addressed             This Visit's Progress    Patient Stated       03/22/2024, denies goals       Depression Screen     03/22/2024   10:20 AM 02/06/2024   11:16 AM 11/08/2023   10:40 AM 09/08/2023    1:38 PM 08/11/2023   10:57 AM 03/03/2023   10:04 AM 02/16/2023    1:45 PM  PHQ 2/9 Scores  PHQ - 2 Score 0 0 0 0 0 0 0  PHQ- 9 Score 1 3 2 3 3  0 2    Fall Risk     03/22/2024   10:19 AM 03/03/2023   10:05 AM 02/16/2023    1:44 PM 11/18/2021   11:04 AM 09/22/2021   10:52 AM  Fall Risk   Falls in the past year? 1 1 1 1 1   Comment not sure      Number falls in past yr: 0 0 0 0 0  Injury with Fall? 1 0 0 0 1   Comment broke elbow      Risk for fall due to : Medication side effect No Fall Risks Other (Comment)  History of fall(s)  Follow up Falls prevention discussed;Falls evaluation completed Falls prevention discussed Falls evaluation completed Falls evaluation completed Falls evaluation completed    MEDICARE RISK AT HOME:  Medicare Risk at Home Any stairs in or around the home?: No If so, are there any without handrails?: No Home free of loose throw rugs in walkways, pet beds, electrical cords, etc?: Yes Adequate lighting in your home to reduce risk of falls?: Yes Life alert?: No Use of a cane, walker or w/c?: No Grab bars in the bathroom?: Yes Shower chair or bench in shower?: No Elevated toilet seat or a handicapped toilet?: Yes  TIMED UP AND GO:  Was the test performed?  No  Cognitive Function: 6CIT completed        03/22/2024   10:21 AM 03/03/2023   10:05 AM 11/18/2021   11:06 AM  6CIT Screen  What Year? 0 points 0 points 0 points  What month? 0 points 0 points 0 points  What time? 0 points 0 points 0 points  Count back from 20 0 points 0 points 0 points  Months in reverse 0 points 0 points 0 points  Repeat phrase 0 points 0 points 0 points  Total Score 0 points 0 points 0 points    Immunizations Immunization History  Administered Date(s) Administered   PFIZER(Purple Top)SARS-COV-2 Vaccination 11/15/2019, 12/01/2019   Pneumococcal Conjugate-13 05/17/2014   Pneumococcal Polysaccharide-23 07/29/2009    Screening Tests Health Maintenance  Topic Date Due   COVID-19 Vaccine (3 - 2024-25 season) 06/26/2023   Zoster Vaccines- Shingrix (1 of 2) 05/07/2024 (Originally 02/05/1991)   DTaP/Tdap/Td (1 - Tdap) 02/05/2025 (Originally [birth year]1960)   INFLUENZA VACCINE  05/25/2024   Medicare Annual Wellness (AWV)  03/22/2025   Pneumonia Vaccine 23+ Years old  Completed   DEXA SCAN  Completed   HPV VACCINES  Aged Out   Meningococcal B Vaccine  Aged Out    Health  Maintenance  Health Maintenance Due  Topic Date Due   COVID-19 Vaccine (3 - 2024-25 season) 06/26/2023   Health Maintenance Items Addressed: Declines vaccines.   Additional Screening:  Vision Screening: Recommended annual ophthalmology exams for early detection of glaucoma and other disorders of the eye.  Dental Screening: Recommended annual dental exams for proper oral hygiene  Community Resource Referral / Chronic Care Management: CRR required this visit?  No   CCM required this visit?  No   Plan:    I have personally reviewed and noted the following in the patient's chart:   Medical and social history Use of alcohol, tobacco or illicit drugs  Current medications and supplements including opioid prescriptions. Patient is not currently taking opioid prescriptions. Functional ability and status Nutritional status Physical activity Advanced directives List of other physicians Hospitalizations, surgeries, and ER visits in previous 12 months Vitals Screenings to include cognitive, depression, and falls Referrals and appointments  In addition, I have reviewed and discussed with patient certain preventive protocols, quality metrics, and best practice recommendations. A written personalized care plan for preventive services as well as general preventive health recommendations were provided to patient.   Bethany Beecham, LPN   1/61/0960   After Visit Summary: (Pick Up) Due to this being a telephonic visit, with patients personalized plan was offered to patient and patient has requested to Pick up at office.  Notes: Nothing significant to report at this time.

## 2024-04-23 ENCOUNTER — Other Ambulatory Visit (HOSPITAL_BASED_OUTPATIENT_CLINIC_OR_DEPARTMENT_OTHER): Payer: Self-pay

## 2024-04-23 ENCOUNTER — Encounter (HOSPITAL_BASED_OUTPATIENT_CLINIC_OR_DEPARTMENT_OTHER): Payer: Self-pay | Admitting: Student

## 2024-04-23 ENCOUNTER — Ambulatory Visit (HOSPITAL_BASED_OUTPATIENT_CLINIC_OR_DEPARTMENT_OTHER): Admitting: Student

## 2024-04-23 VITALS — BP 157/89 | HR 77 | Temp 98.2°F | Resp 16 | Ht 65.0 in | Wt 249.3 lb

## 2024-04-23 DIAGNOSIS — J4520 Mild intermittent asthma, uncomplicated: Secondary | ICD-10-CM

## 2024-04-23 DIAGNOSIS — I10 Essential (primary) hypertension: Secondary | ICD-10-CM

## 2024-04-23 DIAGNOSIS — R0789 Other chest pain: Secondary | ICD-10-CM | POA: Insufficient documentation

## 2024-04-23 DIAGNOSIS — I872 Venous insufficiency (chronic) (peripheral): Secondary | ICD-10-CM | POA: Insufficient documentation

## 2024-04-23 DIAGNOSIS — Z7689 Persons encountering health services in other specified circumstances: Secondary | ICD-10-CM

## 2024-04-23 DIAGNOSIS — K2100 Gastro-esophageal reflux disease with esophagitis, without bleeding: Secondary | ICD-10-CM | POA: Diagnosis not present

## 2024-04-23 MED ORDER — PANTOPRAZOLE SODIUM 20 MG PO TBEC
20.0000 mg | DELAYED_RELEASE_TABLET | Freq: Every day | ORAL | 3 refills | Status: DC
Start: 2024-04-23 — End: 2024-08-15
  Filled 2024-04-23: qty 30, 30d supply, fill #0
  Filled 2024-06-13: qty 30, 30d supply, fill #1

## 2024-04-23 MED ORDER — TRIAMCINOLONE ACETONIDE 0.5 % EX OINT
1.0000 | TOPICAL_OINTMENT | Freq: Two times a day (BID) | CUTANEOUS | 0 refills | Status: DC
Start: 2024-04-23 — End: 2024-07-02
  Filled 2024-04-23: qty 30, 15d supply, fill #0

## 2024-04-23 NOTE — Assessment & Plan Note (Signed)
 Chronic, not at goal due to noncompliance. Has not taken indapamide  over the past day at least. Blood pressure at 157/89 mmHg, indicating the need for continued management. She previously discontinued medication but needs to resume due to elevated readings. Decision to continue medication is based on current blood pressure readings and past history of controlled blood pressure while on medication. - Continue current blood pressure medication. - Reassess blood pressure at next visit.

## 2024-04-23 NOTE — Progress Notes (Signed)
 New Patient Office Visit  Subjective    Patient ID: Bethany Wolfe, female    DOB: 1941-03-31  Age: 83 y.o. MRN: [id number]  CC:  Chief Complaint  Patient presents with   Establish Care    Here to establish care. Has not taken bp med in 2 days so that PCP could see how BP looks without it.   Wound Check    Would like legs checked. Has some dry scaling.     Discussed the use of AI scribe software for clinical note transcription with the patient, who gave verbal consent to proceed.  History of Present Illness   Bethany Wolfe is an 83 year old female who presents with intermittent chest pain. She is accompanied by her husband, Ubaldo.  She experiences intermittent chest pain described as a 'funny little pain' with a hot sensation starting in the chest and radiating to the jaw. These episodes last about three to five minutes and occur sporadically, with the last episode occurring approximately three weeks ago. She has a history of similar episodes over several years. No current chest pain, shortness of breath, dizziness, or palpitations.  She has a history of a lung spot identified during a lung scan, which was determined to be scar tissue. She is a former smoker and is concerned about her lung health due to her family history, as her father died of lung cancer and her mother had lymphoma.  She reports difficulty sleeping in bed since a surgery last September, leading her to sleep in a recliner due to fear of laying down. She also mentions swelling in her legs, particularly behind the knee, with some drainage. She uses knee-high support stockings which help reduce the swelling.  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 She carries an inhaler and uses it as needed.  She mentions a bruise on her elbow from hitting it on a car door. She denies any recent falls but notes increased nervousness since a  previous fall.  She is currently on blood pressure medication but did not take it the day before the visit to allow for an accurate reading. She reports her blood pressure was lower when she was working.     Screenings:  Colon Cancer: not indicated Lung Cancer: not indicated Breast Cancer: not indicated Diabetes: indicated HLD: indicated   Outpatient Encounter Medications as of 04/23/2024  Medication Sig   albuterol  (ACCUNEB ) 1.25 MG/3ML nebulizer solution Take 3 mLs (1.25 mg total) by nebulization every 6 (six) hours as needed for wheezing.   albuterol  (VENTOLIN  HFA) 108 (90 Base) MCG/ACT inhaler TAKE 2 PUFFS BY MOUTH EVERY 6 HOURS AS NEEDED FOR WHEEZE OR SHORTNESS OF BREATH   Cholecalciferol (VITAMIN D3) 50 MCG (2000 UT) capsule Take 1 capsule (2,000 Units total) by mouth daily.   indapamide  (LOZOL ) 1.25 MG tablet Take 1.25 mg by mouth as needed.   pantoprazole (PROTONIX) 20 MG tablet Take 1 tablet (20 mg total) by mouth daily.   triamcinolone  ointment (KENALOG) 0.5 % Apply 1 Application topically 2 (two) times daily. Use for 1 week and let me know if it does not get better.   [DISCONTINUED] ibuprofen  (ADVIL ) 800 MG tablet Take 800 mg by mouth 3 (three) times daily.   [DISCONTINUED] indapamide  (LOZOL ) 1.25 MG tablet TAKE 1 TABLET BY MOUTH DAILY.   No facility-administered encounter medications on file as of 04/23/2024.  Past Medical History:  Diagnosis Date   Asthma    Humerus fracture 06/2023   left    Past Surgical History:  Procedure Laterality Date   ABDOMINAL HYSTERECTOMY     PARTIAL, STILL HAS OVARIES   ANKLE SURGERY  1969   RIGHT, SCREWS   ANTERIOR INTEROSSEOUS NERVE DECOMPRESSION Left 07/14/2023   Procedure: ANTERIOR INTEROSSEOUS NERVE DECOMPRESSION;  Surgeon: Cristy Bonner DASEN, MD;  Location: [facility];  Service: Orthopedics;  Laterality: Left;   APPENDECTOMY  1959   COLONOSCOPY     LUMBAR DISC SURGERY  1980   ruptured disc   ORIF HUMERUS FRACTURE  Left 07/14/2023   Procedure: OPEN REDUCTION INTERNAL FIXATION (ORIF) DISTAL HUMERUS FRACTURE;  Surgeon: Cristy Bonner DASEN, MD;  Location: [facility];  Service: Orthopedics;  Laterality: Left;    Family History  Problem Relation Age of Onset   Lymphoma Mother    Lung cancer Father        smoker    Social History   Socioeconomic History   Marital status: Married    Spouse name: Not on file   Number of children: 3   Years of education: Not on file   Highest education level: Not on file  Occupational History   Not on file  Tobacco Use   Smoking status: Former    Current packs/day: 0.00    Average packs/day: 0.5 packs/day for 18.0 years (9.0 ttl pk-yrs)    Types: Cigarettes    Start date: 100    Quit date: 44    Years since quitting: 30.5    Passive exposure: Past   Smokeless tobacco: Never  Vaping Use   Vaping status: Never Used  Substance and Sexual Activity   Alcohol use: Never   Drug use: Never   Sexual activity: Not Currently  Other Topics Concern   Not on file  Social History Narrative   Not on file   Social Drivers of Health   Financial Resource Strain: Low Risk  (03/22/2024)   Overall Financial Resource Strain (CARDIA)    Difficulty of Paying Living Expenses: Not hard at all  Food Insecurity: No Food Insecurity (03/22/2024)   Hunger Vital Sign    Worried About Running Out of Food in the Last Year: Never true    Ran Out of Food in the Last Year: Never true  Transportation Needs: No Transportation Needs (03/22/2024)   PRAPARE - Administrator, Civil Service (Medical): No    Lack of Transportation (Non-Medical): No  Physical Activity: Inactive (03/22/2024)   Exercise Vital Sign    Days of Exercise per Week: 0 days    Minutes of Exercise per Session: 0 min  Stress: No Stress Concern Present (03/22/2024)   [employer] of Occupational Health - Occupational Stress Questionnaire    Feeling of Stress : Not at all  Social Connections:  Moderately Isolated (03/22/2024)   Social Connection and Isolation Panel    Frequency of Communication with Friends and Family: More than three times a week    Frequency of Social Gatherings with Friends and Family: More than three times a week    Attends Religious Services: Never    Database administrator or Organizations: No    Attends Banker Meetings: Never    Marital Status: Married  Catering manager Violence: Not At Risk (03/22/2024)   Humiliation, Afraid, Rape, and Kick questionnaire    Fear of Current or Ex-Partner: No    Emotionally  Abused: No    Physically Abused: No    Sexually Abused: No    ROS  Per HPI      Objective    BP (!) 157/89   Pulse 77   Temp 98.2 F (36.8 C) (Oral)   Resp 16   Ht 5' 5 (1.651 m)   Wt 249 lb 4.8 oz (113.1 kg)   SpO2 96%   BMI 41.49 kg/m   Physical Exam Constitutional:      General: She is not in acute distress.    Appearance: Normal appearance. She is not ill-appearing.  HENT:     Head: Normocephalic and atraumatic.     Right Ear: External ear normal.     Left Ear: External ear normal.     Nose: Nose normal.     Mouth/Throat:     Mouth: Mucous membranes are moist.     Pharynx: Oropharynx is clear.   Eyes:     General: No scleral icterus.    Conjunctiva/sclera: Conjunctivae normal.     Pupils: Pupils are equal, round, and reactive to light.    Cardiovascular:     Rate and Rhythm: Normal rate and regular rhythm.     Pulses: Normal pulses.     Heart sounds: Normal heart sounds. No murmur heard.    No friction rub.  Pulmonary:     Effort: Pulmonary effort is normal. No respiratory distress.     Breath sounds: Normal breath sounds. No wheezing, rhonchi or rales.   Musculoskeletal:        General: Normal range of motion.   Skin:    General: Skin is warm and dry.     Coloration: Skin is not jaundiced or pale.     Findings: Erythema, lesion and rash present.     Comments: BLE with hemosiderin deposition,  scaling, erythema, and eczematous plaques. 2+ pitting edema.  Mild abrasion that appears to be healing well with minor bruising of Right Upper Extremity.   Neurological:     Mental Status: She is alert.   Psychiatric:        Mood and Affect: Mood normal.        Behavior: Behavior normal.     Last CBC Lab Results  Component Value Date   WBC 6.5 07/07/2023   HGB 15.0 07/07/2023   HCT 48.0 (H) 07/07/2023   MCV 86 07/07/2023   MCH 26.9 07/07/2023   RDW 13.8 07/07/2023   PLT 195 07/07/2023   Last metabolic panel Lab Results  Component Value Date   GLUCOSE 101 (H) 02/06/2024   NA 144 02/06/2024   K 4.6 02/06/2024   CL 105 02/06/2024   CO2 27 02/06/2024   BUN 17 02/06/2024   CREATININE 0.77 02/06/2024   EGFR 76 02/06/2024   CALCIUM 10.0 02/06/2024   PROT 6.5 07/07/2023   ALBUMIN 4.1 07/07/2023   LABGLOB 2.4 07/07/2023   AGRATIO 1.8 11/05/2021   BILITOT 0.4 07/07/2023   ALKPHOS 87 07/07/2023   AST 12 07/07/2023   ALT 9 07/07/2023   Last lipids Lab Results  Component Value Date   CHOL 190 07/07/2023   HDL 48 07/07/2023   LDLCALC 117 (H) 07/07/2023   TRIG 139 07/07/2023   CHOLHDL 4.0 07/07/2023   Last hemoglobin A1c Lab Results  Component Value Date   HGBA1C 5.7 (H) 07/07/2023   Last vitamin D  Lab Results  Component Value Date   VD25OH 29.0 (L) 02/06/2024        Assessment &  Plan:   Encounter to establish care  Stasis dermatitis of both legs Assessment & Plan: Chronic venous stasis dermatitis with exacerbation- with swelling, redness, and discomfort in the legs, particularly behind the knees, since surgery last September. Moisturizing is inconsistent, and current knee-high compression stockings cause discomfort, leading to inconsistent use. - Prescribe triamcinolone  0.5% ointment to apply twice daily for 1-2 weeks. - Recommend using Vaseline after the ointment has absorbed. - Advise obtaining higher compression stockings, preferably above the knee,  from an elastic supply store. - Schedule follow-up in 4 weeks to assess improvement.  Orders: -     Triamcinolone  Acetonide; Apply 1 Application topically 2 (two) times daily. Use for 1 week and let me know if it does not get better.  Dispense: 30 g; Refill: 0  Gastroesophageal reflux disease with esophagitis without hemorrhage Assessment & Plan: Chronic, infrequent exacerbations. Intermittent chest pain, occurring sporadically over several years, lasting 3-5 minutes. Last episode was 3 weeks ago, no gradual worsening of symptoms noted- need to further elucidate at next visit. Differential diagnosis includes acid reflux or cardiac etiology. Favoring heartburn as etiology due to recurrent symptoms. The last episode occurred about three weeks ago. Initiating acid reflux medication to assess alleviation of chest pain, with echocardiogram as an alternative if symptoms persist. - Start a low-dose acid reflux medication to assess if it alleviates chest pain. - If chest pain persists or worsens, consider ordering an echocardiogram. - Advise seeking immediate care if chest pain becomes severe or prolonged.  Orders: -     Pantoprazole Sodium; Take 1 tablet (20 mg total) by mouth daily.  Dispense: 30 tablet; Refill: 3  Essential hypertension Assessment & Plan: Chronic, not at goal due to noncompliance. Has not taken indapamide  over the past day at least. Blood pressure at 157/89 mmHg, indicating the need for continued management. She previously discontinued medication but needs to resume due to elevated readings. Decision to continue medication is based on current blood pressure readings and past history of controlled blood pressure while on medication. - Continue current blood pressure medication. - Reassess blood pressure at next visit.   Mild intermittent asthma without complication Assessment & Plan: Chronic, stable. Asthma with recent wheezing. Symptoms may be exacerbated by environmental factors  or pollen. She has an albuterol  inhaler available for use as needed. - Advise using albuterol  inhaler as needed for wheezing.    Return in about 4 weeks (around 05/21/2024) for Chronic Followup.   Emmalia Heyboer T Maheen Cwikla, PA-C

## 2024-04-23 NOTE — Patient Instructions (Addendum)
 It was nice to see you today!  For your reflux- you may try the following. Anti-reflux measures such as raising the head of the bed, avoiding tight clothing or belts, avoiding eating late at night and not lying down shortly after mealtime and achieving weight loss. Avoid Aspirin, NSAID's such as ibuprofen and aleve), caffeine, peppermints, alcohol, and tobacco.   If you have any problems before your next visit feel free to message me via MyChart (minor issues or questions) or call the office, otherwise you may reach out to schedule an office visit.  Thank you! Gerilyn Pilgrim John Vasconcelos, PA-C

## 2024-04-23 NOTE — Assessment & Plan Note (Signed)
 Chronic, stable. Asthma with recent wheezing. Symptoms may be exacerbated by environmental factors or pollen. She has an albuterol  inhaler available for use as needed. - Advise using albuterol  inhaler as needed for wheezing.

## 2024-04-23 NOTE — Assessment & Plan Note (Addendum)
 Chronic venous stasis dermatitis with exacerbation- with swelling, redness, and discomfort in the legs, particularly behind the knees, since surgery last September. Moisturizing is inconsistent, and current knee-high compression stockings cause discomfort, leading to inconsistent use. - Prescribe triamcinolone  0.5% ointment to apply twice daily for 1-2 weeks. - Recommend using Vaseline after the ointment has absorbed. - Advise obtaining higher compression stockings, preferably above the knee, from an elastic supply store. - Schedule follow-up in 4 weeks to assess improvement.

## 2024-04-23 NOTE — Assessment & Plan Note (Addendum)
 Chronic, infrequent exacerbations. Intermittent chest pain, occurring sporadically over several years, lasting 3-5 minutes. Last episode was 3 weeks ago, no gradual worsening of symptoms noted- need to further elucidate at next visit. Differential diagnosis includes acid reflux or cardiac etiology. Favoring heartburn as etiology due to recurrent symptoms. The last episode occurred about three weeks ago. Initiating acid reflux medication to assess alleviation of chest pain, with echocardiogram as an alternative if symptoms persist. - Start a low-dose acid reflux medication to assess if it alleviates chest pain. - If chest pain persists or worsens, consider ordering an echocardiogram. - Advise seeking immediate care if chest pain becomes severe or prolonged.

## 2024-05-03 ENCOUNTER — Ambulatory Visit: Admitting: Family Medicine

## 2024-05-21 ENCOUNTER — Ambulatory Visit (INDEPENDENT_AMBULATORY_CARE_PROVIDER_SITE_OTHER): Admitting: Student

## 2024-05-21 ENCOUNTER — Encounter (HOSPITAL_BASED_OUTPATIENT_CLINIC_OR_DEPARTMENT_OTHER): Payer: Self-pay | Admitting: Student

## 2024-05-21 ENCOUNTER — Other Ambulatory Visit (HOSPITAL_BASED_OUTPATIENT_CLINIC_OR_DEPARTMENT_OTHER): Payer: Self-pay

## 2024-05-21 VITALS — BP 154/81 | HR 71 | Temp 98.3°F | Resp 16 | Ht 65.0 in | Wt 246.5 lb

## 2024-05-21 DIAGNOSIS — M7021 Olecranon bursitis, right elbow: Secondary | ICD-10-CM

## 2024-05-21 DIAGNOSIS — I10 Essential (primary) hypertension: Secondary | ICD-10-CM

## 2024-05-21 MED ORDER — MELOXICAM 7.5 MG PO TABS
7.5000 mg | ORAL_TABLET | Freq: Every day | ORAL | 0 refills | Status: DC
Start: 2024-05-21 — End: 2024-07-02
  Filled 2024-05-21: qty 15, 15d supply, fill #0

## 2024-05-21 NOTE — Patient Instructions (Signed)
 It was nice to see you today!  If you have any problems before your next visit feel free to message me via MyChart (minor issues or questions) or call the office, otherwise you may reach out to schedule an office visit.  Thank you! Pau Banh, PA-C

## 2024-05-21 NOTE — Progress Notes (Unsigned)
 Established Patient Office Visit  Subjective   Patient ID: Bethany Wolfe, female    DOB: 1941/06/06  Age: 83 y.o. MRN: [id number]  Chief Complaint  Patient presents with   Medical Management of Chronic Issues    Follow up. Right elbow still hurts. Braelin Brosch last time said not to touch the scab. It hurts to lay it down. Left elbow has a lump and she has surgery on it 06/2023.     HPI  Discussed the use of AI scribe software for clinical note transcription with the patient, who gave verbal consent to proceed.  History of Present Illness   Bethany Wolfe is an 83 year old female who presents with right elbow pain and swelling.  She has ongoing issues with her right elbow, which previously underwent surgery. A new lump has developed on the elbow, which the patient reports is painful, especially when pressure is applied. She uses her left arm to push herself up from a sitting position to avoid using the right elbow. There is a scab on the elbow that has not bled and has been present for about a month. She suspects she may have hit it on a car door.  She has a history of cellulitis in the right lower limb and was treated with amoxicillin in the past, despite being allergic to penicillin. She recalls being on antibiotics in the spring for leg issues but is unsure of the exact medication. She has been treated with meloxicam  previously for a knee issue and is familiar with its effects.  She reports ongoing issues with her knees and legs, experiencing significant swelling and pain, particularly in the popliteal fossa. She uses a pedal exerciser and cream to manage symptoms but is concerned about running out of cream. She avoids taking diuretics like Lasix  when she needs to leave the house due to the need for frequent bathroom access.  She has concerns about memory issues, noting difficulty recalling phone numbers and names, although she can remember dates well. She has a fear of driving on highways  and has not driven since a fall in September. She also avoids going into her bedroom due to a fear of falling, which has persisted since the fall.      Patient Active Problem List   Diagnosis Date Noted   Atypical chest pain 04/23/2024   Stasis dermatitis of both legs 04/23/2024   Gastroesophageal reflux disease with esophagitis without hemorrhage 04/23/2024   Morbid obesity (HCC) 04/23/2024   Panic attacks 09/08/2023   Unsteady gait 08/22/2023   Leg edema 08/11/2023   Vitamin D  deficiency 08/11/2023   Primary osteoarthritis of knees, 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   Visit for annual health examination 03/04/2016   Coronary artery calcification 02/11/2016   Diverticulosis of large intestine without hemorrhage 02/11/2016   Essential hypertension 02/11/2016   Hyperlipidemia LDL goal <100 02/11/2016   Mild intermittent asthma 02/11/2016   Class 3 severe obesity due to excess calories with serious comorbidity and body mass index (BMI) of 40.0 to 44.9 in adult 02/11/2016   Osteopenia 02/11/2016   Past Medical History:  Diagnosis Date   Asthma    Humerus fracture 06/2023   left   Social History   Tobacco Use   Smoking status: Former    Current packs/day: 0.00    Average packs/day: 0.5 packs/day for 18.0 years (9.0 ttl pk-yrs)    Types: Cigarettes    Start date: 75    Quit date: 1995    Years since  quitting: 30.5    Passive exposure: Past   Smokeless tobacco: Never  Vaping Use   Vaping status: Never Used  Substance Use Topics   Alcohol use: Never   Drug use: Never   Allergies  Allergen Reactions   Alendronate Sodium     Other reaction(s): GI Upset (intolerance)   Codeine Nausea And Vomiting    SICK ON STOMACH   Lovastatin Nausea Only   Penicillin G Hives    Big whelps   Pravastatin     Other reaction(s): Myalgias (intolerance)   Tetanus Toxoid Hives   Penicillins Rash   Tetanus Toxoids Rash      ROS Per HPI.    Objective:     BP (!) 154/81   Pulse 71   Temp  98.3 F (36.8 C) (Oral)   Resp 16   Ht 5' 5 (1.651 m)   Wt 246 lb 8 oz (111.8 kg)   SpO2 96%   BMI 41.02 kg/m  BP Readings from Last 3 Encounters:  05/21/24 (!) 154/81  04/23/24 (!) 157/89  02/06/24 (!) 145/78   Wt Readings from Last 3 Encounters:  05/21/24 246 lb 8 oz (111.8 kg)  04/23/24 249 lb 4.8 oz (113.1 kg)  02/06/24 255 lb 6.4 oz (115.8 kg)      Physical Exam Constitutional:      General: She is not in acute distress.    Appearance: Normal appearance. She is not ill-appearing.  HENT:     Head: Normocephalic and atraumatic.     Nose: Nose normal.  Eyes:     General: No scleral icterus.    Conjunctiva/sclera: Conjunctivae normal.  Cardiovascular:     Rate and Rhythm: Normal rate and regular rhythm.     Heart sounds: Normal heart sounds. No murmur heard.    No friction rub.  Pulmonary:     Effort: Pulmonary effort is normal. No respiratory distress.     Breath sounds: Normal breath sounds. No wheezing, rhonchi or rales.  Musculoskeletal:        General: Normal range of motion.     Right lower leg: Edema present.     Left lower leg: Edema present.  Skin:    General: Skin is dry.     Coloration: Skin is not jaundiced or pale.  Neurological:     General: No focal deficit present.     Mental Status: She is alert.  Psychiatric:        Mood and Affect: Mood normal.        Behavior: Behavior normal.      Results for orders placed or performed in visit on 05/21/24  CBC with Differential/Platelet  Result Value Ref Range   WBC 6.5 3.4 - 10.8 x10E3/uL   RBC 5.26 3.77 - 5.28 x10E6/uL   Hemoglobin 13.9 11.1 - 15.9 g/dL   Hematocrit 55.2 65.9 - 46.6 %   MCV 85 79 - 97 fL   MCH 26.4 (L) 26.6 - 33.0 pg   MCHC 31.1 (L) 31.5 - 35.7 g/dL   RDW 84.8 88.2 - 84.5 %   Platelets 172 150 - 450 x10E3/uL   Neutrophils 71 Not Estab. %   Lymphs 19 Not Estab. %   Monocytes 6 Not Estab. %   Eos 3 Not Estab. %   Basos 1 Not Estab. %   Neutrophils Absolute 4.7 1.4 - 7.0  x10E3/uL   Lymphocytes Absolute 1.2 0.7 - 3.1 x10E3/uL   Monocytes Absolute 0.4 0.1 - 0.9 x10E3/uL  EOS (ABSOLUTE) 0.2 0.0 - 0.4 x10E3/uL   Basophils Absolute 0.0 0.0 - 0.2 x10E3/uL   Immature Granulocytes 0 Not Estab. %   Immature Grans (Abs) 0.0 0.0 - 0.1 x10E3/uL  Sedimentation rate  Result Value Ref Range   Sed Rate 10 0 - 40 mm/hr    Last CBC Lab Results  Component Value Date   WBC 6.5 05/21/2024   HGB 13.9 05/21/2024   HCT 44.7 05/21/2024   MCV 85 05/21/2024   MCH 26.4 (L) 05/21/2024   RDW 15.1 05/21/2024   PLT 172 05/21/2024   Last metabolic panel Lab Results  Component Value Date   GLUCOSE 101 (H) 02/06/2024   NA 144 02/06/2024   K 4.6 02/06/2024   CL 105 02/06/2024   CO2 27 02/06/2024   BUN 17 02/06/2024   CREATININE 0.77 02/06/2024   EGFR 76 02/06/2024   CALCIUM 10.0 02/06/2024   PROT 6.5 07/07/2023   ALBUMIN 4.1 07/07/2023   LABGLOB 2.4 07/07/2023   AGRATIO 1.8 11/05/2021   BILITOT 0.4 07/07/2023   ALKPHOS 87 07/07/2023   AST 12 07/07/2023   ALT 9 07/07/2023   Last lipids Lab Results  Component Value Date   CHOL 190 07/07/2023   HDL 48 07/07/2023   LDLCALC 117 (H) 07/07/2023   TRIG 139 07/07/2023   CHOLHDL 4.0 07/07/2023   Last hemoglobin A1c Lab Results  Component Value Date   HGBA1C 5.7 (H) 07/07/2023      The ASCVD Risk score (Arnett DK, et al., 2019) failed to calculate for the following reasons:   The 2019 ASCVD risk score is only valid for ages 86 to 89    Assessment & Plan:    Assessment and Plan    Olecranon bursitis 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 Pain occurs with pressure on the elbow. No significant infection, but concern exists about the abrasion overlying the joint. Conservative management is preferred. - Prescribe meloxicam  for 2 weeks to reduce swelling, with instructions to take with food to avoid gastrointestinal upset and potential renal issues - Advise icing the  elbow for 20 minutes, up to 3 times daily - Instruct to avoid leaning on the elbow - Consider a brace for the elbow if symptoms persist after healing - Plan to reassess in 6 weeks - Assess for infection with labs.  Hypertension Blood pressure management is suboptimal due to inconsistent use of antihypertensive medication containing a diuretic. She is concerned about access to bathrooms when away from home, affecting medication adherence. Emphasized the importance of consistent medication use to stabilize blood pressure. - Encourage consistent use of antihypertensive medication - Recheck blood pressure during the visit - Plan to reassess blood pressure management in 6 weeks  Memory concerns Reports increasing forgetfulness, such as difficulty recalling phone numbers and names. Concern exists about potential memory decline, and she expresses a desire to address this issue. Discussed the option of neurocognitive testing to assess memory function. - Schedule neurocognitive testing in 6 weeks - Instruct to bring a family member to the testing for additional insights  Fear of driving and mobility issues Reports fear of driving on highways and difficulty with bed mobility since a fall in September. Concern exists about physical ability and fear of falling, impacting daily activities. Discussed referral to occupational therapy to assist with activities of daily living and improve confidence in mobility. - Continue to monitor and discuss possible OT further at next visit.  I personally spent a total of 33 minutes in the care of the patient today including preparing to see the patient, getting/reviewing separately obtained history, performing a medically appropriate exam/evaluation, counseling and educating, placing orders, and documenting clinical information in the EHR.   Return in about 6 weeks (around 07/02/2024) for Chronic Followup.    Valor [place] T Danney Bungert, PA-C

## 2024-05-22 ENCOUNTER — Ambulatory Visit (HOSPITAL_BASED_OUTPATIENT_CLINIC_OR_DEPARTMENT_OTHER): Payer: Self-pay | Admitting: Student

## 2024-05-22 LAB — CBC WITH DIFFERENTIAL/PLATELET
Basophils Absolute: 0 x10E3/uL (ref 0.0–0.2)
Basos: 1 %
EOS (ABSOLUTE): 0.2 x10E3/uL (ref 0.0–0.4)
Eos: 3 %
Hematocrit: 44.7 % (ref 34.0–46.6)
Hemoglobin: 13.9 g/dL (ref 11.1–15.9)
Immature Grans (Abs): 0 x10E3/uL (ref 0.0–0.1)
Immature Granulocytes: 0 %
Lymphocytes Absolute: 1.2 x10E3/uL (ref 0.7–3.1)
Lymphs: 19 %
MCH: 26.4 pg — ABNORMAL LOW (ref 26.6–33.0)
MCHC: 31.1 g/dL — ABNORMAL LOW (ref 31.5–35.7)
MCV: 85 fL (ref 79–97)
Monocytes Absolute: 0.4 x10E3/uL (ref 0.1–0.9)
Monocytes: 6 %
Neutrophils Absolute: 4.7 x10E3/uL (ref 1.4–7.0)
Neutrophils: 71 %
Platelets: 172 x10E3/uL (ref 150–450)
RBC: 5.26 x10E6/uL (ref 3.77–5.28)
RDW: 15.1 % (ref 11.7–15.4)
WBC: 6.5 x10E3/uL (ref 3.4–10.8)

## 2024-05-22 LAB — SEDIMENTATION RATE: Sed Rate: 10 mm/h (ref 0–40)

## 2024-06-13 ENCOUNTER — Other Ambulatory Visit (HOSPITAL_BASED_OUTPATIENT_CLINIC_OR_DEPARTMENT_OTHER): Payer: Self-pay

## 2024-06-13 MED ORDER — ASPIRIN 81 MG PO CHEW: 81.0000 mg | CHEWABLE_TABLET | Freq: Two times a day (BID) | ORAL | 0 refills | Status: DC

## 2024-06-13 MED ORDER — MUPIROCIN 2 % EX OINT: TOPICAL_OINTMENT | Freq: Two times a day (BID) | CUTANEOUS | 1 refills | Status: DC

## 2024-06-15 ENCOUNTER — Other Ambulatory Visit (HOSPITAL_BASED_OUTPATIENT_CLINIC_OR_DEPARTMENT_OTHER): Payer: Self-pay

## 2024-07-02 ENCOUNTER — Other Ambulatory Visit (HOSPITAL_BASED_OUTPATIENT_CLINIC_OR_DEPARTMENT_OTHER): Payer: Self-pay

## 2024-07-02 ENCOUNTER — Ambulatory Visit (HOSPITAL_BASED_OUTPATIENT_CLINIC_OR_DEPARTMENT_OTHER): Admitting: Student

## 2024-07-02 ENCOUNTER — Encounter (HOSPITAL_BASED_OUTPATIENT_CLINIC_OR_DEPARTMENT_OTHER): Payer: Self-pay | Admitting: Student

## 2024-07-02 VITALS — BP 146/83 | HR 81 | Temp 97.8°F | Resp 16 | Ht 65.0 in | Wt 245.0 lb

## 2024-07-02 DIAGNOSIS — M7021 Olecranon bursitis, right elbow: Secondary | ICD-10-CM

## 2024-07-02 DIAGNOSIS — I10 Essential (primary) hypertension: Secondary | ICD-10-CM | POA: Diagnosis not present

## 2024-07-02 DIAGNOSIS — I872 Venous insufficiency (chronic) (peripheral): Secondary | ICD-10-CM

## 2024-07-02 DIAGNOSIS — G8929 Other chronic pain: Secondary | ICD-10-CM | POA: Diagnosis not present

## 2024-07-02 DIAGNOSIS — L03317 Cellulitis of buttock: Secondary | ICD-10-CM

## 2024-07-02 DIAGNOSIS — K2100 Gastro-esophageal reflux disease with esophagitis, without bleeding: Secondary | ICD-10-CM

## 2024-07-02 DIAGNOSIS — M25562 Pain in left knee: Secondary | ICD-10-CM

## 2024-07-02 DIAGNOSIS — M25561 Pain in right knee: Secondary | ICD-10-CM | POA: Diagnosis not present

## 2024-07-02 MED ORDER — TRIAMCINOLONE ACETONIDE 0.5 % EX OINT
1.0000 | TOPICAL_OINTMENT | Freq: Two times a day (BID) | CUTANEOUS | 0 refills | Status: AC
  Filled 2024-07-02: qty 30, 15d supply, fill #0

## 2024-07-02 MED ORDER — MUPIROCIN 2 % EX OINT
TOPICAL_OINTMENT | Freq: Every day | CUTANEOUS | 1 refills | Status: AC
  Filled 2024-07-02: qty 44, 22d supply, fill #0

## 2024-07-02 MED ORDER — DOXYCYCLINE HYCLATE 100 MG PO TABS
100.0000 mg | ORAL_TABLET | Freq: Two times a day (BID) | ORAL | 0 refills | Status: AC
Start: 2024-07-02 — End: 2024-07-09
  Filled 2024-07-02: qty 14, 7d supply, fill #0

## 2024-07-02 MED ORDER — MELOXICAM 7.5 MG PO TABS
7.5000 mg | ORAL_TABLET | Freq: Every day | ORAL | 0 refills | Status: DC
  Filled 2024-07-02: qty 15, 15d supply, fill #0

## 2024-07-02 NOTE — Progress Notes (Signed)
 Established Patient Office Visit  Subjective   Patient ID: Bethany Wolfe, female    DOB: 1941/08/05  Age: 83 y.o. MRN: [id number]  Chief Complaint  Patient presents with   Medical Management of Chronic Issues    Follow up. Had questions  questions about fluid pills. Has lasix  20mg  as needed along with indapamide . Also was taking lisinopril for several years.  Not sure why she had to stop taking it. Forgot to take BP pill today.   Knee Pain    Right knee is still giving her a fit. Twisted in 2019 and has not be right since. Saw ortho 4 years ago and they wanted to do gel injections and she passed off on that.   Wound Check    Has sore on back. Saw [facility] for it and was given mupirocin  ointment.      HPI  Discussed the use of AI scribe software for clinical note transcription with the patient, who gave verbal consent to proceed.  History of Present Illness   Bethany Wolfe is an 83 year old female who presents for a routine follow-up.  She has persistent issues with her right elbow, which underwent surgery a year ago. The swelling has improved, but she still experiences a 'funny' sensation and slight puffiness.  Her knee pain has worsened recently. She previously found relief with meloxicam  and is considering taking it again. She is currently on pantoprazole  for stomach protection.  She uses triamcinolone  cream once daily for her legs, which have shown improvement.  She discusses her blood pressure management, noting that she was prescribed a medication containing hydrochlorothiazide. She has been alternating this with Lasix  but is unsure about the correct regimen. Her home blood pressure readings average around 120-125/80-84 mmHg.  She has a sore on her backside, described as a 'rubbed place' from sitting in a recliner. She previously received a cream from urgent care, which she has run out of.  She mentions a past allergic reaction to penicillin and is cautious about new  medications. She is also taking her pantoprazole  irregularly, spacing it out every other day or two, due to belching when taking it daily.      Patient Active Problem List   Diagnosis Date Noted   Atypical chest pain 04/23/2024   Stasis dermatitis of both legs 04/23/2024   Gastroesophageal reflux disease with esophagitis without hemorrhage 04/23/2024   Morbid obesity (HCC) 04/23/2024   Panic attacks 09/08/2023   Unsteady gait 08/22/2023   Leg edema 08/11/2023   Vitamin D  deficiency 08/11/2023   Primary osteoarthritis of knees, 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   Visit for annual health examination 03/04/2016   Coronary artery calcification 02/11/2016   Diverticulosis of large intestine without hemorrhage 02/11/2016   Essential hypertension 02/11/2016   Hyperlipidemia LDL goal <100 02/11/2016   Mild intermittent asthma 02/11/2016   Class 3 severe obesity due to excess calories with serious comorbidity and body mass index (BMI) of 40.0 to 44.9 in adult 02/11/2016   Osteopenia 02/11/2016   Past Medical History:  Diagnosis Date   Asthma    Humerus fracture 06/2023   left   Social History   Tobacco Use   Smoking status: Former    Current packs/day: 0.00    Average packs/day: 0.5 packs/day for 18.0 years (9.0 ttl pk-yrs)    Types: Cigarettes    Start date: 11    Quit date: 1995    Years since quitting: 30.7    Passive exposure: Past  Smokeless tobacco: Never  Vaping Use   Vaping status: Never Used  Substance Use Topics   Alcohol use: Never   Drug use: Never   Allergies  Allergen Reactions   Alendronate Sodium     Other reaction(s): GI Upset (intolerance)   Codeine Nausea And Vomiting    SICK ON STOMACH   Lovastatin Nausea Only   Penicillin G Hives    Big whelps   Pravastatin     Other reaction(s): Myalgias (intolerance)   Tetanus Toxoid Hives   Penicillins Rash   Tetanus Toxoid-Containing Vaccines Rash      ROS Per HPI.    Objective:     BP (!) 146/83   Pulse  81   Temp 97.8 F (36.6 C) (Oral)   Resp 16   Ht 5' 5 (1.651 m)   Wt 245 lb (111.1 kg)   SpO2 96%   BMI 40.77 kg/m  BP Readings from Last 3 Encounters:  07/02/24 (!) 146/83  05/21/24 (!) 154/81  04/23/24 (!) 157/89   Wt Readings from Last 3 Encounters:  07/02/24 245 lb (111.1 kg)  05/21/24 246 lb 8 oz (111.8 kg)  04/23/24 249 lb 4.8 oz (113.1 kg)      Physical Exam   No results found for any visits on 07/02/24.  Last CBC Lab Results  Component Value Date   WBC 6.5 05/21/2024   HGB 13.9 05/21/2024   HCT 44.7 05/21/2024   MCV 85 05/21/2024   MCH 26.4 (L) 05/21/2024   RDW 15.1 05/21/2024   PLT 172 05/21/2024   Last metabolic panel Lab Results  Component Value Date   GLUCOSE 101 (H) 02/06/2024   NA 144 02/06/2024   K 4.6 02/06/2024   CL 105 02/06/2024   CO2 27 02/06/2024   BUN 17 02/06/2024   CREATININE 0.77 02/06/2024   EGFR 76 02/06/2024   CALCIUM 10.0 02/06/2024   PROT 6.5 07/07/2023   ALBUMIN 4.1 07/07/2023   LABGLOB 2.4 07/07/2023   AGRATIO 1.8 11/05/2021   BILITOT 0.4 07/07/2023   ALKPHOS 87 07/07/2023   AST 12 07/07/2023   ALT 9 07/07/2023   Last lipids Lab Results  Component Value Date   CHOL 190 07/07/2023   HDL 48 07/07/2023   LDLCALC 117 (H) 07/07/2023   TRIG 139 07/07/2023   CHOLHDL 4.0 07/07/2023   Last hemoglobin A1c Lab Results  Component Value Date   HGBA1C 5.7 (H) 07/07/2023      The ASCVD Risk score (Arnett DK, et al., 2019) failed to calculate for the following reasons:   The 2019 ASCVD risk score is only valid for ages 5 to 5    Assessment & Plan:   Assessment and Plan    Wound on buttocks with risk of pressure ulcer and secondary infection- mild cellulitis The wound on the buttocks is at risk of developing into a pressure ulcer due to its location and seating habits. There is concern for secondary infection, and the area is currently rubbed raw from sitting in a recliner. - Apply mupirocin  ointment topically once  daily to the affected area. - Prescribe doxycycline  100 mg orally twice daily for 7 days. Advise to take with food to prevent nausea and vomiting. - Advise to cover the wound to prevent further rubbing and potential ulceration. - Instruct to monitor for signs of systemic infection such as fever, chills, or sweats and seek emergency care if these occur. - Schedule follow-up appointment in two weeks to reassess the wound.  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 The condition appears to be improving with current treatment but requires adjustment for optimal management. - Increase triamcinolone  cream application to twice daily, focusing on red areas, for one week, then take a week off, and repeat the cycle. - Consider referral to dermatology if redness persists, with [facility] as a potential option.  Right olecranon bursitis, improved Right olecranon bursitis has shown improvement with reduced swelling and puffiness. - Consult with orthopedic surgeon Dr. Dannette if further evaluation is needed.  Chronic knee pain Chronic knee pain is present, and meloxicam  has previously provided significant relief. She is currently on pantoprazole  to protect the stomach from NSAID-related side effects. - Prescribe meloxicam  for a short course of 15 days, with concurrent use of pantoprazole  to protect the stomach.  Hypertension Chronic, not at goal in clinic but supposedly better at home. Hypertension is being managed with indapamide . Blood pressure readings at home are averaging 120-125/80-84 mmHg, which is acceptable. - Continue indapamide  daily for blood pressure management. - Advise to keep a log of blood pressure readings and bring it to the next appointment. - Allow occasional use of Lasix  if extra fluid retention is noted, but caution against  frequent use to prevent electrolyte depletion.      Return in about 2 weeks (around 07/16/2024) for wound check .    Newt Levingston T Jerl Munyan, PA-C

## 2024-07-02 NOTE — Patient Instructions (Signed)
 It was nice to see you today!  Please keep a log for your blood pressure to show me at your next appointment.  If you have any problems before your next visit feel free to message me via MyChart (minor issues or questions) or call the office, otherwise you may reach out to schedule an office visit.  Thank you! Nazli Penn, PA-C

## 2024-07-16 ENCOUNTER — Encounter (HOSPITAL_BASED_OUTPATIENT_CLINIC_OR_DEPARTMENT_OTHER): Payer: Self-pay | Admitting: Student

## 2024-07-16 ENCOUNTER — Other Ambulatory Visit (HOSPITAL_BASED_OUTPATIENT_CLINIC_OR_DEPARTMENT_OTHER): Payer: Self-pay

## 2024-07-16 ENCOUNTER — Ambulatory Visit (INDEPENDENT_AMBULATORY_CARE_PROVIDER_SITE_OTHER): Admitting: Student

## 2024-07-16 VITALS — BP 145/85 | HR 71 | Ht 65.0 in | Wt 246.0 lb

## 2024-07-16 DIAGNOSIS — N3941 Urge incontinence: Secondary | ICD-10-CM | POA: Diagnosis not present

## 2024-07-16 DIAGNOSIS — I872 Venous insufficiency (chronic) (peripheral): Secondary | ICD-10-CM | POA: Diagnosis not present

## 2024-07-16 DIAGNOSIS — I10 Essential (primary) hypertension: Secondary | ICD-10-CM

## 2024-07-16 DIAGNOSIS — Z5189 Encounter for other specified aftercare: Secondary | ICD-10-CM | POA: Diagnosis not present

## 2024-07-16 MED ORDER — LOSARTAN POTASSIUM 25 MG PO TABS
25.0000 mg | ORAL_TABLET | Freq: Every day | ORAL | 5 refills | Status: DC
Start: 2024-07-16 — End: 2024-08-15
  Filled 2024-07-16: qty 30, 30d supply, fill #0

## 2024-07-16 NOTE — Progress Notes (Signed)
 Acute Office Visit  Subjective:     Patient ID: Bethany Wolfe, female    DOB: Sep 06, 1941, 83 y.o.   MRN: [id number]  Chief Complaint  Patient presents with   Wound Check    HPI  Discussed the use of AI scribe software for clinical note transcription with the patient, who gave verbal consent to proceed.  History of Present Illness   Bethany Wolfe is an 83 year old female who presents for a wound check and management of hypertension and edema.  She is here for a follow-up on a wound that is improving. The wound sometimes rubs when reclining, but she no longer feels it when sitting. Pain is improving, though some discomfort is attributed to the Band-Aid rubbing against the wound. She experiences difficulty when getting into a car, which causes pain, but states that the wound on the left side feels almost gone. Ubaldo applies medicine to the wound, and she continues to use it.  She uses triamcinolone  creams on her legs but is unsure of their effectiveness. She experiences soreness in her legs. She has been using indapamide  instead of Lasix , noting that it contains a diuretic component. She describes an episode of urge incontinence, where she experienced a sudden and uncontrollable need to urinate after standing up, which she found distressing.  Her blood pressure readings fluctuate, with only a few readings within the desired range. She uses a wrist blood pressure monitor, which she finds inconsistent. She has not taken her blood pressure medication this morning. She recalls previously being on lisinopril, which was discontinued due to consistently low blood pressure. She mentions having had a cough while on lisinopril, which resolved after discontinuation.  She has not yet seen a dermatologist for her leg issues, which include edema. She plans to try compression stockings again. 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  She inquires about meloxicam , which she used for pain and inflammation, particularly for her elbow. She found it effective initially but is unsure of its current efficacy. She was on a low dose due to concerns about potential stomach ulcers. Her elbow has improved, with reduced swelling.      ROS Per HPI     Objective:    BP (!) 145/85   Pulse 71   Ht 5' 5 (1.651 m)   Wt 246 lb 0.1 oz (111.6 kg)   SpO2 95%   BMI 40.94 kg/m  BP Readings from Last 3 Encounters:  07/16/24 (!) 145/85  07/02/24 (!) 146/83  05/21/24 (!) 154/81   Wt Readings from Last 3 Encounters:  07/16/24 246 lb 0.1 oz (111.6 kg)  07/02/24 245 lb (111.1 kg)  05/21/24 246 lb 8 oz (111.8 kg)      Physical Exam Constitutional:      General: She is not in acute distress.    Appearance: Normal appearance. She is not ill-appearing.  HENT:     Head: Normocephalic and atraumatic.     Nose: Nose normal.  Eyes:     General: No scleral icterus.    Conjunctiva/sclera: Conjunctivae normal.  Cardiovascular:     Rate and Rhythm: Normal rate and regular rhythm.     Heart sounds: Normal heart sounds. No murmur heard.    No friction rub.  Pulmonary:     Effort: Pulmonary effort is normal. No respiratory distress.     Breath sounds: Normal  breath sounds. No wheezing, rhonchi or rales.  Musculoskeletal:        General: Normal range of motion.  Skin:    General: Skin is warm and dry.     Coloration: Skin is not jaundiced or pale.     Comments: Buttock wound: Left side scabbing without purulence and appears to be healing well. Right side without major purulence, viable wound bed and non-necrotic edges- improving.  Neurological:     General: No focal deficit present.     Mental Status: She is alert.  Psychiatric:        Mood and Affect: Mood normal.        Behavior: Behavior normal.     No results found for any visits on 07/16/24.      Assessment & Plan:    Assessment and Plan    Buttock wound Buttock wounds, with improvement noted, especially on the left side. The right side still requires attention but is expected to improve with continued treatment. No signs of pressure ulcers. The wound is likely irritated due to its location, which is frequently in contact with surfaces. - Continue current topical medication for the wounds. - Re-evaluate the wounds in one month. - Return sooner if the condition worsens.  Lower extremity edema with stasis dermatitis Edema in the lower extremities with associated stasis dermatitis. She has been using triamcinolone  cream with limited effect. Compression stockings have been recommended as a non-invasive treatment option before considering a referral to a dermatologist. - Try compression stockings to manage edema and stasis dermatitis. - lasix  prn - Consider getting in with a dermatologist  Hypertension Chronic, not at goal. Hypertension with fluctuating blood pressure readings. Current management includes indapamide . Lisinopril was previously used but discontinued due to a potential cough side effect. A low dose of losartan  is proposed to complement indapamide  without causing a cough. - Initiate low dose losartan  alongside indapamide . - Monitor blood pressure regularly, especially after starting new medication. - Consult paramedics for blood pressure checks during weekly visits with husband   Urge incontinence and overactive bladder Urge incontinence with episodes of sudden, uncontrollable urination. Likely related to aging and decreased muscle tone. Bladder training is suggested as a non-pharmacological approach. Pharmacological treatment with Myrbetriq is considered but deferred until blood pressure is controlled due to potential side effects. - Implement bladder training techniques to improve muscle tone. - Reassess the need for Myrbetriq after achieving better blood pressure control.      Return in  about 4 weeks (around 08/13/2024), or if symptoms worsen or fail to improve, for wound check.  Kaimana Lurz T Yanni Quiroa, PA-C

## 2024-07-16 NOTE — Patient Instructions (Addendum)
 It was nice to see you today!  Please call a dermatology group for follow up on your legs.  If you have any problems before your next visit feel free to message me via MyChart (minor issues or questions) or call the office, otherwise you may reach out to schedule an office visit.  Thank you! Bader Stubblefield, PA-C

## 2024-08-13 DIAGNOSIS — H04123 Dry eye syndrome of bilateral lacrimal glands: Secondary | ICD-10-CM | POA: Diagnosis not present

## 2024-08-13 DIAGNOSIS — H40003 Preglaucoma, unspecified, bilateral: Secondary | ICD-10-CM | POA: Diagnosis not present

## 2024-08-13 DIAGNOSIS — H2513 Age-related nuclear cataract, bilateral: Secondary | ICD-10-CM | POA: Diagnosis not present

## 2024-08-15 ENCOUNTER — Encounter (HOSPITAL_BASED_OUTPATIENT_CLINIC_OR_DEPARTMENT_OTHER): Payer: Self-pay | Admitting: Student

## 2024-08-15 ENCOUNTER — Ambulatory Visit (INDEPENDENT_AMBULATORY_CARE_PROVIDER_SITE_OTHER): Admitting: Student

## 2024-08-15 ENCOUNTER — Other Ambulatory Visit (HOSPITAL_BASED_OUTPATIENT_CLINIC_OR_DEPARTMENT_OTHER): Payer: Self-pay

## 2024-08-15 VITALS — BP 139/81 | HR 76 | Temp 98.1°F | Resp 16 | Ht 65.0 in | Wt 244.9 lb

## 2024-08-15 DIAGNOSIS — Z13220 Encounter for screening for lipoid disorders: Secondary | ICD-10-CM | POA: Diagnosis not present

## 2024-08-15 DIAGNOSIS — K2100 Gastro-esophageal reflux disease with esophagitis, without bleeding: Secondary | ICD-10-CM | POA: Diagnosis not present

## 2024-08-15 DIAGNOSIS — E559 Vitamin D deficiency, unspecified: Secondary | ICD-10-CM | POA: Diagnosis not present

## 2024-08-15 DIAGNOSIS — L89301 Pressure ulcer of unspecified buttock, stage 1: Secondary | ICD-10-CM

## 2024-08-15 DIAGNOSIS — E66813 Obesity, class 3: Secondary | ICD-10-CM

## 2024-08-15 DIAGNOSIS — Z6841 Body Mass Index (BMI) 40.0 and over, adult: Secondary | ICD-10-CM

## 2024-08-15 DIAGNOSIS — Z136 Encounter for screening for cardiovascular disorders: Secondary | ICD-10-CM | POA: Diagnosis not present

## 2024-08-15 DIAGNOSIS — R7303 Prediabetes: Secondary | ICD-10-CM | POA: Diagnosis not present

## 2024-08-15 DIAGNOSIS — I10 Essential (primary) hypertension: Secondary | ICD-10-CM

## 2024-08-15 DIAGNOSIS — I872 Venous insufficiency (chronic) (peripheral): Secondary | ICD-10-CM

## 2024-08-15 DIAGNOSIS — J4520 Mild intermittent asthma, uncomplicated: Secondary | ICD-10-CM

## 2024-08-15 MED ORDER — PANTOPRAZOLE SODIUM 40 MG PO TBEC
40.0000 mg | DELAYED_RELEASE_TABLET | Freq: Every day | ORAL | 6 refills | Status: AC
  Filled 2024-08-15: qty 30, 30d supply, fill #0

## 2024-08-15 MED ORDER — LOSARTAN POTASSIUM 50 MG PO TABS
50.0000 mg | ORAL_TABLET | Freq: Every day | ORAL | 6 refills | Status: AC
  Filled 2024-08-15: qty 30, 30d supply, fill #0
  Filled 2024-10-03: qty 30, 30d supply, fill #1

## 2024-08-15 NOTE — Patient Instructions (Addendum)
 It was nice to see you today!  I will actually have you come to see my nurse in 6 weeks on the increased dose of losartan  and hopefully we will not need to increase your BP meds any further. You will see me in January for your normal follow up.  Let me know if the wound worsens at all but I believe it can be cared for as we have been until it is all healed up.   If you have any problems before your next visit feel free to message me via MyChart (minor issues or questions) or call the office, otherwise you may reach out to schedule an office visit.  Thank you! Jacob Rothfuss, PA-C

## 2024-08-15 NOTE — Progress Notes (Unsigned)
 Established Patient Office Visit  Subjective   Patient ID: Bethany Wolfe, female    DOB: [birth year]  Age: 83 y.o. MRN: [id number]  Chief Complaint  Patient presents with   Medical Management of Chronic Issues    Follow up. Forgot BP log but BP ranging 130's-145/70's-88. HR staying around 70's. Husbands HH nurse checked her BP and her cuff was around 10 digits apart for systolic but diastolic was about the same.    HPI  Discussed the use of AI scribe software for clinical note transcription with the patient, who gave verbal consent to proceed.  History of Present Illness   Bethany Wolfe is an 83 year old female with hypertension who presents for blood pressure management.  Her blood pressure remains elevated at home, often around 137-138/77-78 mmHg. A recent spike was attributed to a stressful incident involving a contractor at her home. She is currently taking losartan  and indapamide  for her hypertension but has not been consistent with indapamide  due to the inconvenience of frequent urination while having contractors at home.  She has a history of a non-healing ulcer on her backside. The ulcer is located where she sits, and the Band-Aid used to cover it catches on the chair cover, which may keep it raw. She has not been able to lie down in bed for a year due to fear of falling after a traumatic injury.  She experiences knee pain, particularly in the right knee, and describes feeling bone rubbing. She has previously received cortisone injections, which were painful, and is considering further treatment options.  She has a history of astigmatism and mild dry eye, which are not currently bothersome. She also has cataracts that have not changed since her last eye examination.  She takes Protonix  for reflux, which she finds effective. She experiences reflux symptoms if she misses a dose, with symptoms varying in timing and often associated with meals.  She mentions a history of a  traumatic injury that has led to a fear of falling, particularly on thick grass or carpeted areas. This fear has contributed to her inability to lie down in bed.  She lives with her husband, Bethany Wolfe, and she does most activities together. She does not drive, as Bethany Wolfe prefers to drive her. She describes a happy relationship with frequent communication and laughter.  Usual leg symptoms with no significant changes. Reflux is well-controlled with Protonix .        Patient Active Problem List   Diagnosis Date Noted   Urge incontinence 07/16/2024   Atypical chest pain 04/23/2024   Stasis dermatitis of both legs 04/23/2024   Gastroesophageal reflux disease with esophagitis without hemorrhage 04/23/2024   Morbid obesity (HCC) 04/23/2024   Panic attacks 09/08/2023   Unsteady gait 08/22/2023   Leg edema 08/11/2023   Vitamin D  deficiency 08/11/2023   Primary osteoarthritis of knees, 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   Visit for annual health examination 03/04/2016   Coronary artery calcification 02/11/2016   Diverticulosis of large intestine without hemorrhage 02/11/2016   Essential hypertension 02/11/2016   Hyperlipidemia LDL goal <100 02/11/2016   Mild intermittent asthma 02/11/2016   Class 3 severe obesity due to excess calories with serious comorbidity and body mass index (BMI) of 40.0 to 44.9 in adult [facility]) 02/11/2016   Osteopenia 02/11/2016   Past Medical History:  Diagnosis Date   Asthma    Humerus fracture 06/2023   left   Social History   Tobacco Use   Smoking status: Former    Current  packs/day: 0.00    Average packs/day: 0.5 packs/day for 18.0 years (9.0 ttl pk-yrs)    Types: Cigarettes    Start date: 41    Quit date: 42    Years since quitting: 30.8    Passive exposure: Past   Smokeless tobacco: Never  Vaping Use   Vaping status: Never Used  Substance Use Topics   Alcohol use: Never   Drug use: Never   Allergies  Allergen Reactions   Alendronate Sodium     Other  reaction(s): GI Upset (intolerance)   Codeine Nausea And Vomiting    SICK ON STOMACH   Lovastatin Nausea Only   Penicillin G Hives    Big whelps   Pravastatin     Other reaction(s): Myalgias (intolerance)   Tetanus Toxoid Hives   Penicillins Rash   Tetanus Toxoid-Containing Vaccines Rash      ROS Per HPI.    Objective:     BP 139/81   Pulse 76   Temp 98.1 F (36.7 C) (Oral)   Resp 16   Ht 5' 5 (1.651 m)   Wt 244 lb 14.4 oz (111.1 kg)   SpO2 96%   BMI 40.75 kg/m  BP Readings from Last 3 Encounters:  08/15/24 139/81  07/16/24 (!) 145/85  07/02/24 (!) 146/83   Wt Readings from Last 3 Encounters:  08/15/24 244 lb 14.4 oz (111.1 kg)  07/16/24 246 lb 0.1 oz (111.6 kg)  07/02/24 245 lb (111.1 kg)      Physical Exam Constitutional:      General: She is not in acute distress.    Appearance: Normal appearance. She is not ill-appearing.  HENT:     Head: Normocephalic and atraumatic.     Nose: Nose normal.  Eyes:     General: No scleral icterus.    Conjunctiva/sclera: Conjunctivae normal.  Cardiovascular:     Rate and Rhythm: Normal rate and regular rhythm.     Heart sounds: Normal heart sounds. No murmur heard.    No friction rub.  Pulmonary:     Effort: Pulmonary effort is normal. No respiratory distress.     Breath sounds: Normal breath sounds. No wheezing, rhonchi or rales.  Skin:    General: Skin is warm and dry.     Coloration: Skin is not jaundiced or pale.     Comments: Wound on bilateral buttock looks much improved, no current concerns for infection.  Neurological:     General: No focal deficit present.     Mental Status: She is alert.  Psychiatric:        Mood and Affect: Mood normal.        Behavior: Behavior normal.    Results for orders placed or performed in visit on 08/15/24  CBC with Differential/Platelet  Result Value Ref Range   WBC 6.3 3.4 - 10.8 x10E3/uL   RBC 5.30 (H) 3.77 - 5.28 x10E6/uL   Hemoglobin 14.1 11.1 - 15.9 g/dL    Hematocrit 55.5 65.9 - 46.6 %   MCV 84 79 - 97 fL   MCH 26.6 26.6 - 33.0 pg   MCHC 31.8 31.5 - 35.7 g/dL   RDW 86.3 88.2 - 84.5 %   Platelets 199 150 - 450 x10E3/uL   Neutrophils 71 Not Estab. %   Lymphs 19 Not Estab. %   Monocytes 6 Not Estab. %   Eos 3 Not Estab. %   Basos 1 Not Estab. %   Neutrophils Absolute 4.5 1.4 - 7.0 x10E3/uL  Lymphocytes Absolute 1.2 0.7 - 3.1 x10E3/uL   Monocytes Absolute 0.4 0.1 - 0.9 x10E3/uL   EOS (ABSOLUTE) 0.2 0.0 - 0.4 x10E3/uL   Basophils Absolute 0.0 0.0 - 0.2 x10E3/uL   Immature Granulocytes 0 Not Estab. %   Immature Grans (Abs) 0.0 0.0 - 0.1 x10E3/uL  Comprehensive metabolic panel with GFR  Result Value Ref Range   Glucose 86 70 - 99 mg/dL   BUN 15 8 - 27 mg/dL   Creatinine, Ser 9.22 0.57 - 1.00 mg/dL   eGFR 76 >40 fO/fpw/8.26   BUN/Creatinine Ratio 19 12 - 28   Sodium 142 134 - 144 mmol/L   Potassium 4.4 3.5 - 5.2 mmol/L   Chloride 103 96 - 106 mmol/L   CO2 25 20 - 29 mmol/L   Calcium 9.8 8.7 - 10.3 mg/dL   Total Protein 6.6 6.0 - 8.5 g/dL   Albumin 4.2 3.7 - 4.7 g/dL   Globulin, Total 2.4 1.5 - 4.5 g/dL   Bilirubin Total 0.4 0.0 - 1.2 mg/dL   Alkaline Phosphatase 91 48 - 129 IU/L   AST 12 0 - 40 IU/L   ALT 6 0 - 32 IU/L  Lipid panel  Result Value Ref Range   Cholesterol, Total 179 100 - 199 mg/dL   Triglycerides 887 0 - 149 mg/dL   HDL 46 >60 mg/dL   VLDL Cholesterol Cal 20 5 - 40 mg/dL   LDL Chol Calc (NIH) 886 (H) 0 - 99 mg/dL   Chol/HDL Ratio 3.9 0.0 - 4.4 ratio  Hemoglobin A1c  Result Value Ref Range   Hgb A1c MFr Bld 5.6 4.8 - 5.6 %   Est. average glucose Bld gHb Est-mCnc 114 mg/dL  VITAMIN D  25 Hydroxy (Vit-D Deficiency, Fractures)  Result Value Ref Range   Vit D, 25-Hydroxy 30.2 30.0 - 100.0 ng/mL    Last CBC Lab Results  Component Value Date   WBC 6.3 08/15/2024   HGB 14.1 08/15/2024   HCT 44.4 08/15/2024   MCV 84 08/15/2024   MCH 26.6 08/15/2024   RDW 13.6 08/15/2024   PLT 199 08/15/2024   Last  metabolic panel Lab Results  Component Value Date   GLUCOSE 86 08/15/2024   NA 142 08/15/2024   K 4.4 08/15/2024   CL 103 08/15/2024   CO2 25 08/15/2024   BUN 15 08/15/2024   CREATININE 0.77 08/15/2024   EGFR 76 08/15/2024   CALCIUM 9.8 08/15/2024   PROT 6.6 08/15/2024   ALBUMIN 4.2 08/15/2024   LABGLOB 2.4 08/15/2024   AGRATIO 1.8 11/05/2021   BILITOT 0.4 08/15/2024   ALKPHOS 91 08/15/2024   AST 12 08/15/2024   ALT 6 08/15/2024   Last lipids Lab Results  Component Value Date   CHOL 179 08/15/2024   HDL 46 08/15/2024   LDLCALC 113 (H) 08/15/2024   TRIG 112 08/15/2024   CHOLHDL 3.9 08/15/2024   Last hemoglobin A1c Lab Results  Component Value Date   HGBA1C 5.6 08/15/2024      The ASCVD Risk score (Arnett DK, et al., 2019) failed to calculate for the following reasons:   The 2019 ASCVD risk score is only valid for ages 54 to 63    Assessment & Plan:   Class 3 severe obesity due to excess calories with serious comorbidity and body mass index (BMI) of 40.0 to 44.9 in adult [facility])  Essential hypertension -     Losartan  Potassium; Take 1 tablet (50 mg total) by mouth daily.  Dispense: 30  tablet; Refill: 6 -     CBC with Differential/Platelet -     Comprehensive metabolic panel with GFR  Stasis dermatitis of both legs  Mild intermittent asthma without complication  Vitamin D  deficiency -     VITAMIN D  25 Hydroxy (Vit-D Deficiency, Fractures)  Gastroesophageal reflux disease with esophagitis without hemorrhage -     Pantoprazole  Sodium; Take 1 tablet (40 mg total) by mouth daily.  Dispense: 30 tablet; Refill: 6 -     CBC with Differential/Platelet -     Comprehensive metabolic panel with GFR  Prediabetes -     Hemoglobin A1c  Encounter for lipid screening for cardiovascular disease -     Lipid panel    Assessment and Plan    Essential hypertension Chronic, not at goal. Blood pressure remains slightly elevated at home with systolic readings around  137-138 mmHg. Diastolic readings and pulse are within normal limits. Recent stress at home may have contributed to elevated readings. Current medication regimen includes losartan  and indapamide . She has not been consistently taking indapamide  due to inconvenience with contractor at home. - Increase losartan  to 50 mg daily by taking two 25 mg tablets until current supply is exhausted. - Encourage consistent use of indapamide . - Recheck blood pressure before leaving the office. - Assess CBC, CMP  Chronic right knee pain due to osteoarthritis Chronic pain in the right knee, likely due to osteoarthritis. Reports feeling bone rubbing and significant pain, especially after a past traumatic injury. Discussed potential benefits of knee injections, including lidocaine  and steroid injections, to reduce inflammation and pain. Advised against knee replacement due to age and potential complications. - Refer to [facility] for evaluation and possible knee injections. - Discuss potential for gel injections if recommended by orthopedist.  Chronic sacral pressure ulcer, improving Chronic sacral pressure ulcer is improving but remains slightly raw due to friction from sitting in a recliner. She reports it is better, but husband disagrees. Discussed potential benefits of offloading pressure with foam padding. - Consider referral to wound care center if ulcer does not resolve. - Advise use of foam padding to offload pressure from ulcer. - Continue current wound care regimen.  Gastroesophageal reflux disease GERD is well-controlled with current Protonix  regimen. Symptoms occur if medication is missed, but are not consistent in timing. Discussed increasing dosage to ensure symptoms are fully managed. - Increase Protonix  dosage to ensure adequate control of symptoms.    Morbid obesity Weight has remained stable.  - continue to monitor weight and discuss dietary changes and exercise further at next visit  Vitamin  D Def - Recheck vitamin D  today  Return in about 3 months (around 11/05/2024) for Chronic Followup.    Wade Asebedo T Margi Edmundson, PA-C

## 2024-08-16 ENCOUNTER — Ambulatory Visit (HOSPITAL_BASED_OUTPATIENT_CLINIC_OR_DEPARTMENT_OTHER): Payer: Self-pay | Admitting: Student

## 2024-08-16 LAB — CBC WITH DIFFERENTIAL/PLATELET
Basophils Absolute: 0 x10E3/uL (ref 0.0–0.2)
Basos: 1 %
EOS (ABSOLUTE): 0.2 x10E3/uL (ref 0.0–0.4)
Eos: 3 %
Hematocrit: 44.4 % (ref 34.0–46.6)
Hemoglobin: 14.1 g/dL (ref 11.1–15.9)
Immature Grans (Abs): 0 x10E3/uL (ref 0.0–0.1)
Immature Granulocytes: 0 %
Lymphocytes Absolute: 1.2 x10E3/uL (ref 0.7–3.1)
Lymphs: 19 %
MCH: 26.6 pg (ref 26.6–33.0)
MCHC: 31.8 g/dL (ref 31.5–35.7)
MCV: 84 fL (ref 79–97)
Monocytes Absolute: 0.4 x10E3/uL (ref 0.1–0.9)
Monocytes: 6 %
Neutrophils Absolute: 4.5 x10E3/uL (ref 1.4–7.0)
Neutrophils: 71 %
Platelets: 199 x10E3/uL (ref 150–450)
RBC: 5.3 x10E6/uL — ABNORMAL HIGH (ref 3.77–5.28)
RDW: 13.6 % (ref 11.7–15.4)
WBC: 6.3 x10E3/uL (ref 3.4–10.8)

## 2024-08-16 LAB — COMPREHENSIVE METABOLIC PANEL WITH GFR
ALT: 6 IU/L (ref 0–32)
AST: 12 IU/L (ref 0–40)
Albumin: 4.2 g/dL (ref 3.7–4.7)
Alkaline Phosphatase: 91 IU/L (ref 48–129)
BUN/Creatinine Ratio: 19 (ref 12–28)
BUN: 15 mg/dL (ref 8–27)
Bilirubin Total: 0.4 mg/dL (ref 0.0–1.2)
CO2: 25 mmol/L (ref 20–29)
Calcium: 9.8 mg/dL (ref 8.7–10.3)
Chloride: 103 mmol/L (ref 96–106)
Creatinine, Ser: 0.77 mg/dL (ref 0.57–1.00)
Globulin, Total: 2.4 g/dL (ref 1.5–4.5)
Glucose: 86 mg/dL (ref 70–99)
Potassium: 4.4 mmol/L (ref 3.5–5.2)
Sodium: 142 mmol/L (ref 134–144)
Total Protein: 6.6 g/dL (ref 6.0–8.5)
eGFR: 76 mL/min/1.73 (ref >59)

## 2024-08-16 LAB — LIPID PANEL
Chol/HDL Ratio: 3.9 ratio (ref 0.0–4.4)
Cholesterol, Total: 179 mg/dL (ref 100–199)
HDL: 46 mg/dL (ref >39)
LDL Chol Calc (NIH): 113 mg/dL — ABNORMAL HIGH (ref 0–99)
Triglycerides: 112 mg/dL (ref 0–149)
VLDL Cholesterol Cal: 20 mg/dL (ref 5–40)

## 2024-08-16 LAB — HEMOGLOBIN A1C
Est. average glucose Bld gHb Est-mCnc: 114 mg/dL
Hgb A1c MFr Bld: 5.6 % (ref 4.8–5.6)

## 2024-08-16 LAB — VITAMIN D 25 HYDROXY (VIT D DEFICIENCY, FRACTURES): Vit D, 25-Hydroxy: 30.2 ng/mL (ref 30.0–100.0)

## 2024-08-17 DIAGNOSIS — L89301 Pressure ulcer of unspecified buttock, stage 1: Secondary | ICD-10-CM | POA: Insufficient documentation

## 2024-08-17 DIAGNOSIS — R7303 Prediabetes: Secondary | ICD-10-CM | POA: Insufficient documentation

## 2024-08-27 ENCOUNTER — Other Ambulatory Visit (HOSPITAL_BASED_OUTPATIENT_CLINIC_OR_DEPARTMENT_OTHER): Payer: Self-pay

## 2024-08-27 DIAGNOSIS — I872 Venous insufficiency (chronic) (peripheral): Secondary | ICD-10-CM | POA: Diagnosis not present

## 2024-08-27 MED ORDER — CLOBETASOL PROPIONATE 0.05 % EX OINT
TOPICAL_OINTMENT | Freq: Two times a day (BID) | CUTANEOUS | 1 refills | Status: DC
  Filled 2024-08-27: qty 60, 30d supply, fill #0
  Filled 2024-10-10: qty 30, 30d supply, fill #1
  Filled 2024-10-10: qty 60, 30d supply, fill #1

## 2024-08-27 MED ORDER — UREA 40 % EX CREA
TOPICAL_CREAM | Freq: Two times a day (BID) | CUTANEOUS | 2 refills | Status: AC
  Filled 2024-08-27: qty 425, 30d supply, fill #0
  Filled 2024-09-03: qty 85, 7d supply, fill #0

## 2024-08-28 ENCOUNTER — Other Ambulatory Visit (HOSPITAL_BASED_OUTPATIENT_CLINIC_OR_DEPARTMENT_OTHER): Payer: Self-pay

## 2024-08-29 ENCOUNTER — Other Ambulatory Visit (HOSPITAL_BASED_OUTPATIENT_CLINIC_OR_DEPARTMENT_OTHER): Payer: Self-pay

## 2024-08-29 ENCOUNTER — Other Ambulatory Visit: Payer: Self-pay | Admitting: Family Medicine

## 2024-08-30 ENCOUNTER — Other Ambulatory Visit (HOSPITAL_BASED_OUTPATIENT_CLINIC_OR_DEPARTMENT_OTHER): Payer: Self-pay

## 2024-09-03 ENCOUNTER — Other Ambulatory Visit (HOSPITAL_BASED_OUTPATIENT_CLINIC_OR_DEPARTMENT_OTHER): Payer: Self-pay

## 2024-09-18 DIAGNOSIS — M170 Bilateral primary osteoarthritis of knee: Secondary | ICD-10-CM | POA: Diagnosis not present

## 2024-10-03 ENCOUNTER — Encounter (HOSPITAL_BASED_OUTPATIENT_CLINIC_OR_DEPARTMENT_OTHER): Payer: Self-pay | Admitting: Student

## 2024-10-03 ENCOUNTER — Ambulatory Visit (INDEPENDENT_AMBULATORY_CARE_PROVIDER_SITE_OTHER): Admitting: Student

## 2024-10-03 ENCOUNTER — Other Ambulatory Visit (HOSPITAL_BASED_OUTPATIENT_CLINIC_OR_DEPARTMENT_OTHER): Payer: Self-pay

## 2024-10-03 VITALS — BP 135/85 | HR 82 | Temp 97.5°F | Resp 16 | Ht 65.0 in | Wt 248.0 lb

## 2024-10-03 DIAGNOSIS — R2681 Unsteadiness on feet: Secondary | ICD-10-CM | POA: Diagnosis not present

## 2024-10-03 DIAGNOSIS — T148XXA Other injury of unspecified body region, initial encounter: Secondary | ICD-10-CM | POA: Diagnosis not present

## 2024-10-03 DIAGNOSIS — W19XXXA Unspecified fall, initial encounter: Secondary | ICD-10-CM

## 2024-10-03 DIAGNOSIS — M170 Bilateral primary osteoarthritis of knee: Secondary | ICD-10-CM | POA: Diagnosis not present

## 2024-10-03 DIAGNOSIS — L03115 Cellulitis of right lower limb: Secondary | ICD-10-CM | POA: Diagnosis not present

## 2024-10-03 MED ORDER — DOXYCYCLINE HYCLATE 100 MG PO TABS
100.0000 mg | ORAL_TABLET | Freq: Two times a day (BID) | ORAL | 0 refills | Status: AC
  Filled 2024-10-03: qty 10, 5d supply, fill #0

## 2024-10-03 NOTE — Patient Instructions (Addendum)
 It was nice to see you today!  As we discussed in clinic:  Please take your BP pill when you get home.   If you have any problems before your next visit feel free to message me via MyChart (minor issues or questions) or call the office, otherwise you may reach out to schedule an office visit.  Thank you! Sean Malinowski, PA-C

## 2024-10-03 NOTE — Progress Notes (Signed)
 Acute Office Visit  Subjective:     Patient ID: Bethany Wolfe, female    DOB: [birth year], 83 y.o.   MRN: [id number]  Chief Complaint  Patient presents with   Fall    Pt had fall on Friday coming up steps. Lost balance. Hit knees on concrete steps. Could not get up. Fire department went to help her get back up. Has been able to walk but feels she should've been checked out. Is bruised up. Legs hurt.    HPI  Discussed the use of AI scribe software for clinical note transcription with the patient, who gave verbal consent to proceed.  History of Present Illness   Bethany Wolfe is an 83 year old female who presents with injuries from a fall.  She missed a step and fell on cement steps, landing in bushes. She hit the side of her leg against the concrete step, resulting in significant bruising and hematoma formation on her leg. The bruising was initially extensive, covering a large area, and has improved slightly over the past two days. A new hematoma has developed that was not present the previous day.  Her right knee is particularly painful, described as 'killing me.' She did not hit her head during the fall but injured her hands, with one finger being swollen and previously blackened, though it has improved. There is a slight bruise on her collarbone, which was previously tender.  She has a history of severe arthritis in both knees, for which she has received cortisone shots that provided relief for about a week. She experiences difficulty with balance, particularly when getting up and sitting down, and expresses concern about falling again. She uses a cane but struggles with balance, and her family suggests using a walker, which she is hesitant to use, associating it with 'giving up.'  She has been using a muscle relaxer prescribed by her doctor, which she stopped taking because it made her unable to function. She has also taken amoxicillin, believing it might help prevent infection,  although she is aware of her penicillin allergy. She has been to dermatology for her legs, where she was prescribed creams including clobetasol , but reports no significant improvement. She has an upcoming dermatology appointment in January.  No numbness or tingling in her hands or legs. No fever, chills, or sweats. No current use of blood thinners or aspirin .           ROS Per HPI     Objective:    BP 135/85   Pulse 82   Temp (!) 97.5 F (36.4 C) (Oral)   Resp 16   Ht 5' 5 (1.651 m)   Wt 248 lb (112.5 kg)   SpO2 96%   BMI 41.27 kg/m  BP Readings from Last 3 Encounters:  10/03/24 135/85  08/15/24 139/81  07/16/24 (!) 145/85   Wt Readings from Last 3 Encounters:  10/03/24 248 lb (112.5 kg)  08/15/24 244 lb 14.4 oz (111.1 kg)  07/16/24 246 lb 0.1 oz (111.6 kg)   SpO2 Readings from Last 3 Encounters:  10/03/24 96%  08/15/24 96%  07/16/24 95%      Physical Exam Constitutional:      General: She is not in acute distress.    Appearance: Normal appearance. She is not ill-appearing.  HENT:     Head: Normocephalic and atraumatic.     Nose: Nose normal.  Eyes:     General: No scleral icterus.    Conjunctiva/sclera: Conjunctivae normal.  Cardiovascular:  Rate and Rhythm: Normal rate.  Musculoskeletal:        General: Tenderness, deformity and signs of injury present. Normal range of motion.     Comments: BLE erythematous and concerning for multiple hematomas. No numbness or tingling in BLUE. No numbness or tingling noted in BLLE. See HPI for Photo.  Skin:    General: Skin is warm and dry.     Coloration: Skin is not jaundiced or pale.  Neurological:     General: No focal deficit present.     Mental Status: She is alert.  Psychiatric:        Mood and Affect: Mood normal.        Behavior: Behavior normal.     No results found for any visits on 10/03/24.      Assessment & Plan:   Assessment and Plan    Lower extremity wounds and hematomas following  fall Multiple open wounds and hematomas on both legs following a fall. Hematoma on the left leg is large and soft, indicating significant blood accumulation. No signs of infection currently, but risk of infection due to open wounds. No blood thinners or aspirin  use. No numbness or tingling in the legs. No significant pressure buildup in the legs. Previous use of amoxicillin without adverse reaction, despite penicillin allergy. - Ordered x-rays of bilateral hands and bilateral tib/fib - Referred to wound care clinic for urgent evaluation and management - Drained hematoma on left leg - Cleaned wounds with saline soaked gauze - Applied Tegaderm and gauze wrap to wounds - Prescribed doxy to cover for possible cellulitis - Instructed to change dressing twice daily or once daily depending on drainage - Advised to use Vashe for daily wound cleaning - Instructed to follow up with wound care clinic urgently  Gait instability and fall risk Gait instability and increased fall risk following recent fall. Difficulty with balance and mobility, particularly when sitting and standing. No current use of assistive devices. Discussion about the use of a walker to prevent future falls. She is resistant to using a walker but understands the necessity to prevent further injury. 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 Discussion about potential for low-dose radiation therapy as an alternative treatment option. She is interested in exploring non-surgical options. Radiation therapy may help delay the need for surgery by targeting mild to moderate arthritis. - Will discuss potential for low-dose radiation therapy with radiation specialist - Will consider follow-up with orthopedist for further management options      I  personally spent a total of 43 minutes in the care of the patient today including preparing to see the patient, getting/reviewing separately obtained history, performing a medically appropriate exam/evaluation, counseling and educating, placing orders, and documenting clinical information in the EHR.   Return if symptoms worsen or fail to improve.  Akshitha Culmer T Taiwan Talcott, PA-C

## 2024-10-03 NOTE — Progress Notes (Signed)
° °  10/03/2024  Patient ID: Bethany Wolfe, female   DOB: 05/30/1941, 83 y.o.   MRN: [id number]  Pharmacy Quality Measure Review  This patient is appearing on a report for being at risk of failing the adherence measure for cholesterol (statin) medications this calendar year.   Medication: losartan  50mg   Last fill date: 08/15/24 for 30 day supply  Will collaborate with provider to facilitate refill needs moving forward. For now patient is likely to fail adherence measure   Lang Sieve, PharmD, BCGP Clinical Pharmacist  [phone]

## 2024-10-04 ENCOUNTER — Ambulatory Visit (HOSPITAL_BASED_OUTPATIENT_CLINIC_OR_DEPARTMENT_OTHER): Payer: Self-pay | Admitting: Student

## 2024-10-04 ENCOUNTER — Ambulatory Visit (INDEPENDENT_AMBULATORY_CARE_PROVIDER_SITE_OTHER)
Admission: RE | Admit: 2024-10-04 | Discharge: 2024-10-04 | Disposition: A | Discharge status: Home / Self Care | Source: Ambulatory Visit | Attending: Student | Admitting: Student

## 2024-10-04 DIAGNOSIS — M79604 Pain in right leg: Secondary | ICD-10-CM | POA: Diagnosis not present

## 2024-10-04 DIAGNOSIS — M79642 Pain in left hand: Secondary | ICD-10-CM

## 2024-10-04 DIAGNOSIS — W19XXXA Unspecified fall, initial encounter: Secondary | ICD-10-CM

## 2024-10-04 DIAGNOSIS — M79605 Pain in left leg: Secondary | ICD-10-CM | POA: Diagnosis not present

## 2024-10-04 DIAGNOSIS — M79641 Pain in right hand: Secondary | ICD-10-CM | POA: Diagnosis not present

## 2024-10-04 DIAGNOSIS — M1711 Unilateral primary osteoarthritis, right knee: Secondary | ICD-10-CM | POA: Diagnosis not present

## 2024-10-04 DIAGNOSIS — Z043 Encounter for examination and observation following other accident: Secondary | ICD-10-CM | POA: Diagnosis not present

## 2024-10-04 DIAGNOSIS — R600 Localized edema: Secondary | ICD-10-CM | POA: Diagnosis not present

## 2024-10-10 ENCOUNTER — Other Ambulatory Visit (HOSPITAL_BASED_OUTPATIENT_CLINIC_OR_DEPARTMENT_OTHER): Payer: Self-pay

## 2024-10-16 ENCOUNTER — Other Ambulatory Visit (HOSPITAL_BASED_OUTPATIENT_CLINIC_OR_DEPARTMENT_OTHER): Payer: Self-pay

## 2024-10-16 MED ORDER — NYSTATIN 100000 UNIT/ML MT SUSP
6.0000 mL | Freq: Four times a day (QID) | OROMUCOSAL | 0 refills | Status: DC
  Filled 2024-10-16: qty 170, 7d supply, fill #0

## 2024-10-16 MED ORDER — SULFAMETHOXAZOLE-TRIMETHOPRIM 800-160 MG PO TABS
1.0000 | ORAL_TABLET | Freq: Two times a day (BID) | ORAL | 0 refills | Status: AC
  Filled 2024-10-16: qty 20, 10d supply, fill #0

## 2024-10-17 ENCOUNTER — Other Ambulatory Visit (HOSPITAL_BASED_OUTPATIENT_CLINIC_OR_DEPARTMENT_OTHER): Payer: Self-pay

## 2024-10-22 ENCOUNTER — Ambulatory Visit: Payer: Self-pay

## 2024-10-22 NOTE — Telephone Encounter (Signed)
 FYI Only or Action Required?: FYI only for provider: appointment scheduled on 10/24/2024 at 3:50 PM.  Patient was last seen in primary care on 10/03/2024 by Rothfuss, Lang DASEN, PA-C.  Called Nurse Triage reporting Leg Pain.  Symptoms began several days ago.  Interventions attempted: Rest, hydration, or home remedies.  Symptoms are: unchanged.  Triage Disposition: See PCP When Office is Open (Within 3 Days)  Patient/caregiver understands and will follow disposition?: Yes  Copied from CRM #[id number]. Topic: Clinical - Red Word Triage >> Oct 22, 2024  2:30 PM Thersia BROCKS wrote: [facility] that prompted transfer to Nurse Triage: Patient called in stated she is having arthiris pain in her knees , would like to know if any prescription could be called for that . Reason for Disposition  [1] MODERATE pain (e.g., interferes with normal activities, limping) AND [2] present > 3 days  Answer Assessment - Initial Assessment Questions Patient was diagnosed with arthritis via ortho.requesting medication for pain.   1. ONSET: When did the pain start?      Chronic pain-increased last Friday 2. LOCATION: Where is the pain located?      Both legs but pain is worse in the right leg from the knee down 3. PAIN: How bad is the pain?    (Scale 1-10; or mild, moderate, severe)     7.5 4. WORK OR EXERCISE: Has there been any recent work or exercise that involved this part of the body?      no 5. CAUSE: What do you think is causing the leg pain?     arthritis 6. OTHER SYMPTOMS: Do you have any other symptoms? (e.g., chest pain, back pain, breathing difficulty, swelling, rash, fever, numbness, weakness)     Numbness to right hand  Protocols used: Leg Pain-A-AH

## 2024-10-24 ENCOUNTER — Encounter (HOSPITAL_BASED_OUTPATIENT_CLINIC_OR_DEPARTMENT_OTHER): Payer: Self-pay | Admitting: Student

## 2024-10-24 ENCOUNTER — Other Ambulatory Visit (HOSPITAL_BASED_OUTPATIENT_CLINIC_OR_DEPARTMENT_OTHER): Payer: Self-pay

## 2024-10-24 ENCOUNTER — Ambulatory Visit: Payer: Self-pay

## 2024-10-24 ENCOUNTER — Ambulatory Visit (INDEPENDENT_AMBULATORY_CARE_PROVIDER_SITE_OTHER): Admitting: Student

## 2024-10-24 VITALS — BP 102/67 | HR 83 | Temp 97.5°F | Resp 16 | Ht 65.0 in | Wt 236.9 lb

## 2024-10-24 DIAGNOSIS — H6590 Unspecified nonsuppurative otitis media, unspecified ear: Secondary | ICD-10-CM

## 2024-10-24 DIAGNOSIS — M170 Bilateral primary osteoarthritis of knee: Secondary | ICD-10-CM

## 2024-10-24 DIAGNOSIS — G5603 Carpal tunnel syndrome, bilateral upper limbs: Secondary | ICD-10-CM | POA: Diagnosis not present

## 2024-10-24 DIAGNOSIS — Z5189 Encounter for other specified aftercare: Secondary | ICD-10-CM

## 2024-10-24 DIAGNOSIS — R262 Difficulty in walking, not elsewhere classified: Secondary | ICD-10-CM

## 2024-10-24 MED ORDER — FLUTICASONE PROPIONATE 50 MCG/ACT NA SUSP
2.0000 | Freq: Every day | NASAL | 6 refills | Status: AC
  Filled 2024-10-24: qty 16, 30d supply, fill #0

## 2024-10-24 NOTE — Patient Instructions (Signed)
 It was nice to see you today!  If you have any problems before your next visit feel free to message me via MyChart (minor issues or questions) or call the office, otherwise you may reach out to schedule an office visit.  Thank you! Pau Banh, PA-C

## 2024-10-24 NOTE — Telephone Encounter (Signed)
 FYI Only or Action Required?: FYI only for provider: appointment scheduled on 12/31.  Patient was last seen in primary care on 10/03/2024 by Rothfuss, Lang DASEN, PA-C.  Called Nurse Triage reporting Open Wound.   Triage Disposition: Information or Advice Only Call  Patient/caregiver understands and will follow disposition?: Yes      Copied from CRM #[id number]. Topic: Clinical - Red Word Triage >> Oct 24, 2024 10:48 AM Tinnie BROCKS wrote: Red Word that prompted transfer to Nurse Triage: Pt was recently sent home from wound care center and she says they did not send her home with an outer wrap after a chunk was removed from her leg. She wrapped it with what she was given, but it quickly bled through. Reason for Disposition  Health information question, no triage required and triager able to answer question  Answer Assessment - Initial Assessment Questions 1. REASON FOR CALL: What is the main reason for your call? or How can I best help you?    Patient called into to triage to report she has a wound on her leg that was seen by PCP, and he referred her to a wound care specialist. She stated she went yesterday and had the wound treated, and wrapped but they did not send her home with all of the supplies to dress the wound. Pt. Sated she called their office and they are closed. She stated she has an appt. With PCP today 12/31 and will have provider look at the area and further advise. RN agrees, and no further concerns at this time from patient.  Protocols used: Information Only Call - No Triage-A-AH

## 2024-10-24 NOTE — Progress Notes (Signed)
 "  Acute Office Visit  Subjective:     Patient ID: Bethany Wolfe, female    DOB: 24-Oct-1941, 83 y.o.   MRN: [id number]  Chief Complaint  Patient presents with   Pain    Pain on right knee. Both have arthritis.    Ear Fullness    Ear is shocked up. Keeps hearing a noise that sounds like a machine.     HPI  Discussed the use of AI scribe software for clinical note transcription with the patient, who gave verbal consent to proceed.  History of Present Illness   Bethany Wolfe is an 83 year old female with arthritis who presents with right knee pain and impaired ambulation.  She experiences significant pain in her right knee, which has worsened to the point where she can hardly walk. Yesterday, she was able to walk with some difficulty, but today the pain is severe and constant, making it difficult for her to get up from a chair. She requires assistance from Kiana to stand.  She recently underwent a procedure to remove clotted blood from her leg. The wound was not infected but had formed a hard scab. The procedure involved evacuating the clots. She encountered an issue with bleeding through the outer wrap and not having enough supplies to rewrap it. She reused the outer wrap and is seeking additional supplies.  She is currently on methotrexate and takes Tylenol  Arthritis for pain management. She has difficulty managing her knee pain and is considering additional pain relief options for particularly hard days.  She also has numbness and difficulty picking up things due to carpal tunnel syndrome, diagnosed about ten years ago. She experiences significant numbness in her fingers and is considering using a brace at night to alleviate symptoms.   ROS Per HPI     Objective:    BP 102/67   Pulse 83   Temp (!) 97.5 F (36.4 C) (Oral)   Resp 16   Ht 5' 5 (1.651 m)   Wt 236 lb 14.4 oz (107.5 kg)   SpO2 94%   BMI 39.42 kg/m  BP Readings from Last 3 Encounters:  10/24/24 102/67   10/03/24 135/85  08/15/24 139/81   Wt Readings from Last 3 Encounters:  10/24/24 236 lb 14.4 oz (107.5 kg)  10/03/24 248 lb (112.5 kg)  08/15/24 244 lb 14.4 oz (111.1 kg)   SpO2 Readings from Last 3 Encounters:  10/24/24 94%  10/03/24 96%  08/15/24 96%      Physical Exam Constitutional:      General: She is not in acute distress.    Appearance: Normal appearance. She is not ill-appearing.  HENT:     Head: Normocephalic and atraumatic.     Nose: Nose normal.  Eyes:     General: No scleral icterus.    Conjunctiva/sclera: Conjunctivae normal.  Pulmonary:     Effort: Pulmonary effort is normal.     Breath sounds: Normal breath sounds. No stridor.  Musculoskeletal:        General: Normal range of motion.     Comments: Marked difficulty with ambulation and especially with rising form a chair.   Bilateral strong tinel's sign for carpal tunnel  Skin:    General: Skin is warm and dry.     Coloration: Skin is not jaundiced or pale.  Neurological:     General: No focal deficit present.     Mental Status: She is alert.     Motor: Weakness present.  Psychiatric:  Mood and Affect: Mood normal.        Behavior: Behavior normal.     No results found for any visits on 10/24/24.      Assessment & Plan:   Assessment and Plan    Bilateral primary osteoarthritis of knee Moderate osteoarthritis in the right knee, with unclear severity in the left knee. Significant pain and impaired ambulation. Knee replacement surgery is not recommended due to age-related risks. Gel injections are considered a less invasive option compared to surgery. Low dose radiation therapy is another potential option, but requires further evaluation and is not suitable for severe arthritis. - Referred to orthopedics for gel injections in both knees. - Will consider low dose radiation therapy if gel injections are ineffective. - Initiated physical therapy at home to improve ambulation and  strength.  Chronic wound of lower extremity Chronic wound with clotting and scabbing, requiring regular dressing changes. No signs of infection. Recent wound care involved evacuation of clotted blood and scab removal. - Instructed to change dressing twice daily as per wound care instructions. - Continue to follow with wound care  Impaired ambulation Due to knee pain and chronic wound. Physical therapy is recommended to improve mobility and strength. - Initiated home physical therapy to improve ambulation and strength.  Carpal tunnel syndrome, bilateral Bilateral carpal tunnel syndrome with significant symptoms. Previous steroid injection was well-tolerated. Nighttime bracing is recommended to alleviate symptoms. - Recommended wearing wrist braces at night to prevent hand flexion. - Referred to orthopedics for potential steroid injection if symptoms persist.  Otitis media with effusion Suspected otitis media with effusion, possibly related to allergies. Symptoms include hearing heartbeat and ear fullness. - Prescribed Flonase nasal spray, two sprays in each nostril twice daily for one week, then once daily.      I personally spent a total of 33 minutes in the care of the patient today including preparing to see the patient, getting/reviewing separately obtained history, performing a medically appropriate exam/evaluation, counseling and educating, placing orders, and documenting clinical information in the EHR.   Return if symptoms worsen or fail to improve.  Teniqua Marron T Oshay Stranahan, PA-C  "

## 2024-10-28 ENCOUNTER — Emergency Department (HOSPITAL_COMMUNITY)

## 2024-10-28 ENCOUNTER — Inpatient Hospital Stay (HOSPITAL_COMMUNITY)
Admission: EM | Admit: 2024-10-28 | Discharge: 2024-11-07 | DRG: 871 | Disposition: A | Discharge status: Skilled Nursing Facility | Attending: Internal Medicine | Admitting: Internal Medicine

## 2024-10-28 ENCOUNTER — Encounter (HOSPITAL_COMMUNITY): Payer: Self-pay

## 2024-10-28 DIAGNOSIS — G9341 Metabolic encephalopathy: Secondary | ICD-10-CM | POA: Diagnosis present

## 2024-10-28 DIAGNOSIS — R296 Repeated falls: Secondary | ICD-10-CM | POA: Diagnosis present

## 2024-10-28 DIAGNOSIS — Z87891 Personal history of nicotine dependence: Secondary | ICD-10-CM

## 2024-10-28 DIAGNOSIS — A419 Sepsis, unspecified organism: Principal | ICD-10-CM | POA: Diagnosis present

## 2024-10-28 DIAGNOSIS — Z79899 Other long term (current) drug therapy: Secondary | ICD-10-CM

## 2024-10-28 DIAGNOSIS — K219 Gastro-esophageal reflux disease without esophagitis: Secondary | ICD-10-CM | POA: Diagnosis present

## 2024-10-28 DIAGNOSIS — R6520 Severe sepsis without septic shock: Secondary | ICD-10-CM | POA: Diagnosis present

## 2024-10-28 DIAGNOSIS — Z885 Allergy status to narcotic agent status: Secondary | ICD-10-CM

## 2024-10-28 DIAGNOSIS — L89150 Pressure ulcer of sacral region, unstageable: Secondary | ICD-10-CM | POA: Diagnosis present

## 2024-10-28 DIAGNOSIS — L89313 Pressure ulcer of right buttock, stage 3: Secondary | ICD-10-CM | POA: Diagnosis present

## 2024-10-28 DIAGNOSIS — Z6839 Body mass index (BMI) 39.0-39.9, adult: Secondary | ICD-10-CM

## 2024-10-28 DIAGNOSIS — N170 Acute kidney failure with tubular necrosis: Secondary | ICD-10-CM | POA: Diagnosis present

## 2024-10-28 DIAGNOSIS — R54 Age-related physical debility: Secondary | ICD-10-CM | POA: Diagnosis present

## 2024-10-28 DIAGNOSIS — R4182 Altered mental status, unspecified: Principal | ICD-10-CM

## 2024-10-28 DIAGNOSIS — Z981 Arthrodesis status: Secondary | ICD-10-CM

## 2024-10-28 DIAGNOSIS — E538 Deficiency of other specified B group vitamins: Secondary | ICD-10-CM | POA: Diagnosis present

## 2024-10-28 DIAGNOSIS — L89323 Pressure ulcer of left buttock, stage 3: Secondary | ICD-10-CM | POA: Diagnosis present

## 2024-10-28 DIAGNOSIS — I10 Essential (primary) hypertension: Secondary | ICD-10-CM | POA: Diagnosis present

## 2024-10-28 DIAGNOSIS — L03115 Cellulitis of right lower limb: Secondary | ICD-10-CM | POA: Diagnosis present

## 2024-10-28 DIAGNOSIS — E785 Hyperlipidemia, unspecified: Secondary | ICD-10-CM | POA: Diagnosis present

## 2024-10-28 DIAGNOSIS — Z880 Allergy status to penicillin: Secondary | ICD-10-CM

## 2024-10-28 DIAGNOSIS — Z887 Allergy status to serum and vaccine status: Secondary | ICD-10-CM

## 2024-10-28 DIAGNOSIS — Z7982 Long term (current) use of aspirin: Secondary | ICD-10-CM

## 2024-10-28 DIAGNOSIS — E66812 Obesity, class 2: Secondary | ICD-10-CM | POA: Diagnosis present

## 2024-10-28 LAB — BASIC METABOLIC PANEL WITH GFR
Anion gap: 9 (ref 5–15)
BUN: 22 mg/dL (ref 8–23)
CO2: 29 mmol/L (ref 22–32)
Calcium: 10.3 mg/dL (ref 8.9–10.3)
Chloride: 102 mmol/L (ref 98–111)
Creatinine, Ser: 1.17 mg/dL — ABNORMAL HIGH (ref 0.44–1.00)
GFR, Estimated: 46 mL/min — ABNORMAL LOW
Glucose, Bld: 133 mg/dL — ABNORMAL HIGH (ref 70–99)
Potassium: 3.9 mmol/L (ref 3.5–5.1)
Sodium: 140 mmol/L (ref 135–145)

## 2024-10-28 LAB — CBC
HCT: 39.6 % (ref 36.0–46.0)
Hemoglobin: 12.6 g/dL (ref 12.0–15.0)
MCH: 26.4 pg (ref 26.0–34.0)
MCHC: 31.8 g/dL (ref 30.0–36.0)
MCV: 83 fL (ref 80.0–100.0)
Platelets: 373 K/uL (ref 150–400)
RBC: 4.77 MIL/uL (ref 3.87–5.11)
RDW: 13.5 % (ref 11.5–15.5)
WBC: 12.8 K/uL — ABNORMAL HIGH (ref 4.0–10.5)
nRBC: 0 % (ref 0.0–0.2)

## 2024-10-28 LAB — TROPONIN T, HIGH SENSITIVITY: Troponin T High Sensitivity: 27 ng/L — ABNORMAL HIGH (ref 0–19)

## 2024-10-28 MED ORDER — FENTANYL CITRATE (PF) 50 MCG/ML IJ SOSY
25.0000 ug | PREFILLED_SYRINGE | Freq: Once | INTRAMUSCULAR | Status: AC
  Administered 2024-10-29: 25 ug via INTRAVENOUS
  Filled 2024-10-28: qty 1

## 2024-10-28 MED ORDER — SODIUM CHLORIDE 0.9 % IV BOLUS
1000.0000 mL | Freq: Once | INTRAVENOUS | Status: AC
  Administered 2024-10-29: 1000 mL via INTRAVENOUS

## 2024-10-28 NOTE — ED Provider Notes (Signed)
 " [place] EMERGENCY DEPARTMENT AT [facility] Provider Note   CSN: [id number] Arrival date & time: 10/28/24  2219     Patient presents with: Bethany Wolfe is a 84 y.o. female. Hx of HTN, hyperlipidemia, GERD,   {Add pertinent medical, surgical, social history, OB history to HPI:32947} HPI     Prior to Admission medications  Medication Sig Start Date End Date Taking? Authorizing Provider  acetaminophen  (TYLENOL ) 650 MG CR tablet Take 650 mg by mouth every 8 (eight) hours as needed for pain.    [provider]  albuterol  (ACCUNEB ) 1.25 MG/3ML nebulizer solution Take 3 mLs (1.25 mg total) by nebulization every 6 (six) hours as needed for wheezing. 08/11/23   Chandra Toribio POUR, MD  albuterol  (VENTOLIN  HFA) 108 (90 Base) MCG/ACT inhaler TAKE 2 PUFFS BY MOUTH EVERY 6 HOURS AS NEEDED FOR WHEEZE OR SHORTNESS OF BREATH 02/02/24   Chandra Toribio POUR, MD  aspirin  81 MG chewable tablet Chew 1 tablet (81 mg total) by mouth 2 (two) times daily. Patient not taking: Reported on 10/24/2024 08/05/23     Cholecalciferol  (VITAMIN D3) 50 MCG (2000 UT) capsule Take 1 capsule (2,000 Units total) by mouth daily. 11/09/23   Chandra Toribio POUR, MD  fluticasone  (FLONASE ) 50 MCG/ACT nasal spray Place 2 sprays into both nostrils daily. 10/24/24   Rothfuss, Jacob T, PA-C  indapamide  (LOZOL ) 1.25 MG tablet TAKE 1 TABLET BY MOUTH EVERY DAY 08/29/24   Rothfuss, Jacob T, PA-C  losartan  (COZAAR ) 50 MG tablet Take 1 tablet (50 mg total) by mouth daily. 08/15/24   Rothfuss, Jacob T, PA-C  mupirocin  ointment (BACTROBAN ) 2 % Apply topically daily. Apply to area on buttock. 07/02/24   Rothfuss, Jacob T, PA-C  pantoprazole  (PROTONIX ) 40 MG tablet Take 1 tablet (40 mg total) by mouth daily. 08/15/24   Rothfuss, Jacob T, PA-C  triamcinolone  ointment (KENALOG ) 0.5 % Apply 1 Application topically 2 (two) times daily. Use for only one week at a time before taking one week off. 07/02/24   Rothfuss, Jacob T, PA-C   urea  (CARMOL) 40 % CREA Apply topically to legs 2 (two) times daily for 1-2 weeks 08/27/24       Allergies: Alendronate sodium; Codeine; Lovastatin; Penicillin g; Pravastatin; Tetanus toxoid; Tetanus toxoid, adsorbed; Penicillins; and Tetanus toxoid-containing vaccines    Review of Systems  Updated Vital Signs BP (!) 141/73   Pulse (!) 106   Temp 97.8 F (36.6 C) (Oral)   Resp (!) 21   SpO2 100%   Physical Exam  (all labs ordered are listed, but only abnormal results are displayed) Labs Reviewed  BASIC METABOLIC PANEL WITH GFR  CBC  TROPONIN T, HIGH SENSITIVITY    EKG: None  Radiology: No results found.  {Document cardiac monitor, telemetry assessment procedure when appropriate:32947} Procedures   Medications Ordered in the ED - No data to display    {Click here for ABCD2, HEART and other calculators REFRESH Note before signing:1}                              Medical Decision Making  ***  {Document critical care time when appropriate  Document review of labs and clinical decision tools ie [id number], etc  Document your independent review of radiology images and any outside records  Document your discussion with family members, caretakers and with consultants  Document social determinants of health affecting pt's care  Document  your decision making why or why not admission, treatments were needed:32947:::1}   Final diagnoses:  None    ED Discharge Orders     None        "

## 2024-10-28 NOTE — ED Triage Notes (Signed)
 Pt comes from home after a unwitnessed fall in the kitchen, no LOC, hit head, c/o of pain to bilateral shoulders and R hip pain, CP when breathing and lower back pain

## 2024-10-29 ENCOUNTER — Emergency Department (HOSPITAL_COMMUNITY)

## 2024-10-29 ENCOUNTER — Other Ambulatory Visit: Payer: Self-pay

## 2024-10-29 ENCOUNTER — Inpatient Hospital Stay (HOSPITAL_COMMUNITY)

## 2024-10-29 DIAGNOSIS — Z880 Allergy status to penicillin: Secondary | ICD-10-CM | POA: Diagnosis not present

## 2024-10-29 DIAGNOSIS — R296 Repeated falls: Secondary | ICD-10-CM | POA: Diagnosis present

## 2024-10-29 DIAGNOSIS — E538 Deficiency of other specified B group vitamins: Secondary | ICD-10-CM | POA: Diagnosis present

## 2024-10-29 DIAGNOSIS — A419 Sepsis, unspecified organism: Secondary | ICD-10-CM | POA: Diagnosis present

## 2024-10-29 DIAGNOSIS — R4182 Altered mental status, unspecified: Secondary | ICD-10-CM | POA: Diagnosis present

## 2024-10-29 DIAGNOSIS — E785 Hyperlipidemia, unspecified: Secondary | ICD-10-CM | POA: Diagnosis present

## 2024-10-29 DIAGNOSIS — I10 Essential (primary) hypertension: Secondary | ICD-10-CM | POA: Diagnosis present

## 2024-10-29 DIAGNOSIS — L0390 Cellulitis, unspecified: Secondary | ICD-10-CM | POA: Diagnosis not present

## 2024-10-29 DIAGNOSIS — G9341 Metabolic encephalopathy: Secondary | ICD-10-CM | POA: Diagnosis present

## 2024-10-29 DIAGNOSIS — R6520 Severe sepsis without septic shock: Secondary | ICD-10-CM | POA: Diagnosis present

## 2024-10-29 DIAGNOSIS — N170 Acute kidney failure with tubular necrosis: Secondary | ICD-10-CM | POA: Diagnosis present

## 2024-10-29 DIAGNOSIS — Z79899 Other long term (current) drug therapy: Secondary | ICD-10-CM | POA: Diagnosis not present

## 2024-10-29 DIAGNOSIS — Z6839 Body mass index (BMI) 39.0-39.9, adult: Secondary | ICD-10-CM | POA: Diagnosis not present

## 2024-10-29 DIAGNOSIS — Z7982 Long term (current) use of aspirin: Secondary | ICD-10-CM | POA: Diagnosis not present

## 2024-10-29 DIAGNOSIS — Z87891 Personal history of nicotine dependence: Secondary | ICD-10-CM | POA: Diagnosis not present

## 2024-10-29 DIAGNOSIS — K219 Gastro-esophageal reflux disease without esophagitis: Secondary | ICD-10-CM | POA: Diagnosis present

## 2024-10-29 DIAGNOSIS — L89150 Pressure ulcer of sacral region, unstageable: Secondary | ICD-10-CM | POA: Diagnosis present

## 2024-10-29 DIAGNOSIS — L03115 Cellulitis of right lower limb: Secondary | ICD-10-CM | POA: Diagnosis present

## 2024-10-29 DIAGNOSIS — Z885 Allergy status to narcotic agent status: Secondary | ICD-10-CM | POA: Diagnosis not present

## 2024-10-29 DIAGNOSIS — Z887 Allergy status to serum and vaccine status: Secondary | ICD-10-CM | POA: Diagnosis not present

## 2024-10-29 DIAGNOSIS — L89313 Pressure ulcer of right buttock, stage 3: Secondary | ICD-10-CM | POA: Diagnosis present

## 2024-10-29 DIAGNOSIS — L89323 Pressure ulcer of left buttock, stage 3: Secondary | ICD-10-CM | POA: Diagnosis present

## 2024-10-29 DIAGNOSIS — R54 Age-related physical debility: Secondary | ICD-10-CM | POA: Diagnosis present

## 2024-10-29 DIAGNOSIS — E66812 Obesity, class 2: Secondary | ICD-10-CM | POA: Diagnosis present

## 2024-10-29 DIAGNOSIS — Z981 Arthrodesis status: Secondary | ICD-10-CM | POA: Diagnosis not present

## 2024-10-29 LAB — URINALYSIS, ROUTINE W REFLEX MICROSCOPIC
Bilirubin Urine: NEGATIVE
Glucose, UA: NEGATIVE mg/dL
Hgb urine dipstick: NEGATIVE
Ketones, ur: NEGATIVE mg/dL
Leukocytes,Ua: NEGATIVE
Nitrite: NEGATIVE
Protein, ur: NEGATIVE mg/dL
Specific Gravity, Urine: 1.028 (ref 1.005–1.030)
pH: 5 (ref 5.0–8.0)

## 2024-10-29 LAB — HEPATIC FUNCTION PANEL
ALT: 14 U/L (ref 0–44)
AST: 30 U/L (ref 15–41)
Albumin: 3.6 g/dL (ref 3.5–5.0)
Alkaline Phosphatase: 94 U/L (ref 38–126)
Bilirubin, Direct: <0.1 mg/dL (ref 0.0–0.2)
Total Bilirubin: 0.4 mg/dL (ref 0.0–1.2)
Total Protein: 6.7 g/dL (ref 6.5–8.1)

## 2024-10-29 LAB — URINE DRUG SCREEN
Amphetamines: NEGATIVE
Barbiturates: NEGATIVE
Benzodiazepines: NEGATIVE
Cocaine: NEGATIVE
Fentanyl: NEGATIVE
Methadone Scn, Ur: NEGATIVE
Opiates: NEGATIVE
Tetrahydrocannabinol: NEGATIVE

## 2024-10-29 LAB — LACTIC ACID, PLASMA: Lactic Acid, Venous: 1.1 mmol/L (ref 0.5–1.9)

## 2024-10-29 LAB — TROPONIN T, HIGH SENSITIVITY: Troponin T High Sensitivity: 27 ng/L — ABNORMAL HIGH (ref 0–19)

## 2024-10-29 LAB — TSH: TSH: 1.53 u[IU]/mL (ref 0.350–4.500)

## 2024-10-29 LAB — AMMONIA: Ammonia: 50 umol/L — ABNORMAL HIGH (ref 9–35)

## 2024-10-29 LAB — PRO BRAIN NATRIURETIC PEPTIDE: Pro Brain Natriuretic Peptide: 191 pg/mL

## 2024-10-29 MED ORDER — COLLAGENASE 250 UNIT/GM EX OINT
TOPICAL_OINTMENT | Freq: Every day | CUTANEOUS | Status: DC
  Filled 2024-10-29: qty 30

## 2024-10-29 MED ORDER — ENOXAPARIN SODIUM 40 MG/0.4ML IJ SOSY
40.0000 mg | PREFILLED_SYRINGE | INTRAMUSCULAR | Status: DC
  Administered 2024-10-29 – 2024-11-07 (×10): 40 mg via SUBCUTANEOUS
  Filled 2024-10-29 (×10): qty 0.4

## 2024-10-29 MED ORDER — TRAZODONE HCL 50 MG PO TABS: 25.0000 mg | ORAL_TABLET | Freq: Every evening | ORAL | Status: DC | PRN

## 2024-10-29 MED ORDER — ACETAMINOPHEN 325 MG PO TABS: 650.0000 mg | ORAL_TABLET | Freq: Four times a day (QID) | ORAL | Status: DC | PRN

## 2024-10-29 MED ORDER — IOHEXOL 300 MG/ML  SOLN: 75.0000 mL | Freq: Once | INTRAMUSCULAR | Status: DC | PRN

## 2024-10-29 MED ORDER — HYDROCERIN EX CREA
TOPICAL_CREAM | Freq: Every day | CUTANEOUS | Status: DC
  Filled 2024-10-29: qty 113

## 2024-10-29 MED ORDER — ACETAMINOPHEN 500 MG PO TABS
1000.0000 mg | ORAL_TABLET | Freq: Once | ORAL | Status: AC
  Administered 2024-10-29: 1000 mg via ORAL
  Filled 2024-10-29: qty 2

## 2024-10-29 MED ORDER — ACETAMINOPHEN 650 MG RE SUPP: 650.0000 mg | Freq: Four times a day (QID) | RECTAL | Status: DC | PRN

## 2024-10-29 MED ORDER — ONDANSETRON HCL 4 MG PO TABS: 4.0000 mg | ORAL_TABLET | Freq: Four times a day (QID) | ORAL | Status: DC | PRN

## 2024-10-29 MED ORDER — ONDANSETRON HCL 4 MG/2ML IJ SOLN: 4.0000 mg | Freq: Four times a day (QID) | INTRAMUSCULAR | Status: DC | PRN

## 2024-10-29 MED ORDER — SODIUM CHLORIDE 0.9 % IV SOLN: INTRAVENOUS | Status: AC

## 2024-10-29 MED ORDER — IOHEXOL 350 MG/ML SOLN
75.0000 mL | Freq: Once | INTRAVENOUS | Status: AC | PRN
  Administered 2024-10-29: 75 mL via INTRAVENOUS

## 2024-10-29 MED ORDER — ALBUTEROL SULFATE (2.5 MG/3ML) 0.083% IN NEBU: 2.5000 mg | INHALATION_SOLUTION | RESPIRATORY_TRACT | Status: DC | PRN

## 2024-10-29 MED ORDER — LORAZEPAM 1 MG PO TABS
1.0000 mg | ORAL_TABLET | Freq: Once | ORAL | Status: AC
  Administered 2024-10-29: 1 mg via ORAL
  Filled 2024-10-29: qty 1

## 2024-10-29 MED ORDER — CEFAZOLIN SODIUM-DEXTROSE 2-4 GM/100ML-% IV SOLN
2.0000 g | Freq: Three times a day (TID) | INTRAVENOUS | Status: DC
  Administered 2024-10-29 – 2024-11-01 (×10): 2 g via INTRAVENOUS
  Filled 2024-10-29 (×11): qty 100

## 2024-10-29 NOTE — Evaluation (Signed)
 Physical Therapy Evaluation Patient Details Name: Bethany Wolfe MRN: [id number] DOB: 02-25-1941 Today's Date: 10/29/2024  History of Present Illness  84 yo female presents to therapy from ED secondary to 5 falls in the past 3 wks one of which on date of admission 10/28/2024. Pt reportedly fell backward and struck her head, unclear of LOC and pt is not currently reporting vision changes or N and V. Pt c/o B hip pain, R  elbow, chest and LBP. Family endorses progressive confusion over the past 3 wks and well as decline with UE motor control and coordination. Pt found to have LE wounds and hematoma and dx with sepsis secondary to R LE cellulitis as well as encephalopathy. Pt PMH includes but is not limited to: HTN, HLD, GERD, asthma, L humeral fx s/p ORIF (2024), lumbar surgery and R ankle surgery.  Clinical Impression      Pt admitted with above diagnosis.  Pt currently with functional limitations due to the deficits listed below (see PT Problem List). Pt in ED when PT arrived. Pt very lethargic and required sternal rub for attention initially, nursing aware. Pt is oriented to self only. PT provided orientation cues for hospital ED, rational ect with pt reporting she wanted to go home. Pt is a poor historian and unable to provide insight to PLOF. Pt required multimodal cues with increased time for motor processing and planning as well as attention to tasks, minimal initiation of movement with pt requiring max A for supine <> sit, CGA for sitting balance EOB. Pt returned to supine and able to participate with scooting toward HOB. Pt noted to have B LE edema, rubor and calor and R distal anterior LE wound, weeping at time of eval. PT elevated B LE with use of hospital bed features and all needs in place.  Patient will benefit from continued inpatient follow up therapy, <3 hours/day.  Pt will benefit from acute skilled PT to increase their independence and safety with mobility to allow discharge.       If  plan is discharge home, recommend the following: Two people to help with walking and/or transfers;Two people to help with bathing/dressing/bathroom;Assistance with cooking/housework;Direct supervision/assist for medications management;Direct supervision/assist for financial management;Assist for transportation;Help with stairs or ramp for entrance;Supervision due to cognitive status   Can travel by private vehicle   No    Equipment Recommendations None recommended by PT (TBD-pt unable to provide insight to PLOF nor DME)  Recommendations for Other Services       Functional Status Assessment Patient has had a recent decline in their functional status and demonstrates the ability to make significant improvements in function in a reasonable and predictable amount of time.     Precautions / Restrictions Precautions Precautions: Fall Restrictions Weight Bearing Restrictions Per Provider Order: No      Mobility  Bed Mobility Overal bed mobility: Needs Assistance Bed Mobility: Supine to Sit, Sit to Supine     Supine to sit: Max assist, HOB elevated Sit to supine: Max assist   General bed mobility comments: max A for supine <> sit, minimal initiation of movement to assist with pt able to bridge and push when in supine to assist toward [facility] with mod/max A to complete task    Transfers                   General transfer comment: NT pt limited due to extensive assist to EOB and B LE pain    Ambulation/Gait  General Gait Details: NT  Stairs            Wheelchair Mobility     Tilt Bed    Modified Rankin (Stroke Patients Only)       Balance Overall balance assessment: History of Falls, Needs assistance Sitting-balance support: Feet supported, Bilateral upper extremity supported Sitting balance-Leahy Scale: Fair Sitting balance - Comments: static sitting balance with CGA and min cues                                      Pertinent Vitals/Pain Pain Assessment Pain Assessment: Faces Faces Pain Scale: Hurts little more Pain Location: B LE R > L Pain Descriptors / Indicators: Constant, Discomfort, Guarding Pain Intervention(s): Limited activity within patient's tolerance, Monitored during session, Repositioned    Home Living Family/patient expects to be discharged to:: Private residence Living Arrangements: Spouse/significant other Available Help at Discharge: Family Type of Home: House             Additional Comments: pt is a poor historian, pt initally reported living at home alone, PT inquired specifics due to notes in chart and pt reported having spouse and grandchild. pt unable to provide insight to home set up nor PLOF with pt stating she does everything for herself    Prior Function Prior Level of Function : Patient poor historian/Family not available                     Extremity/Trunk Assessment        Lower Extremity Assessment Lower Extremity Assessment: Difficult to assess due to impaired cognition;Generalized weakness (pain)    Cervical / Trunk Assessment Cervical / Trunk Assessment: Normal  Communication   Communication Communication: Impaired Factors Affecting Communication: Difficulty expressing self    Cognition Arousal: Lethargic Behavior During Therapy: Flat affect   PT - Cognitive impairments: History of cognitive impairments, No family/caregiver present to determine baseline, Orientation, Awareness, Memory, Attention, Initiation, Sequencing, Problem solving, Safety/Judgement                       PT - Cognition Comments: pt oriented to self only at time of eval, pt able to report her name is Bethany Wolfe and stated that she wanted to go home Following commands: Impaired Following commands impaired: Follows one step commands with increased time     Cueing Cueing Techniques: Verbal cues, Gestural cues, Tactile cues, Visual cues     General  Comments General comments (skin integrity, edema, etc.): B LE edema, rubor and calor R > L with R distal anterior LE wound open and weaping sangrious fluid, B LE tender to touch and increased pain with supine <> sit. pt is unable to rate pain on numeric scale. pt on 2 L/min supplemental O2 and 95-96% and on RA 94-95% and no reports of SOB    Exercises     Assessment/Plan    PT Assessment Patient needs continued PT services  PT Problem List Decreased strength;Decreased range of motion;Decreased activity tolerance;Decreased balance;Decreased mobility;Decreased coordination;Decreased cognition;Decreased knowledge of use of DME;Decreased safety awareness;Decreased knowledge of precautions;Cardiopulmonary status limiting activity;Pain;Decreased skin integrity       PT Treatment Interventions DME instruction;Gait training;Stair training;Functional mobility training;Therapeutic activities;Therapeutic exercise;Balance training;Neuromuscular re-education;Cognitive remediation;Patient/family education    PT Goals (Current goals can be found in the Care Plan section)  Acute Rehab PT Goals PT Goal Formulation: Patient unable to participate  in goal setting    Frequency Min 2X/week     Co-evaluation               AM-PAC PT 6 Clicks Mobility  Outcome Measure Help needed turning from your back to your side while in a flat bed without using bedrails?: A Lot Help needed moving from lying on your back to sitting on the side of a flat bed without using bedrails?: A Lot Help needed moving to and from a bed to a chair (including a wheelchair)?: Total Help needed standing up from a chair using your arms (e.g., wheelchair or bedside chair)?: Total Help needed to walk in hospital room?: Total Help needed climbing 3-5 steps with a railing? : Total 6 Click Score: 8    End of Session Equipment Utilized During Treatment: Oxygen Activity Tolerance: Patient limited by pain;Other (comment)  (cognition) Patient left: in bed;with call bell/phone within reach (nursing staff made aware bed alarm not functioning in ED) Nurse Communication: Mobility status PT Visit Diagnosis: Unsteadiness on feet (R26.81);Other abnormalities of gait and mobility (R26.89);Repeated falls (R29.6);Muscle weakness (generalized) (M62.81);Difficulty in walking, not elsewhere classified (R26.2);Pain Pain - Right/Left: Right Pain - part of body: Leg    Time: 1030-1054 PT Time Calculation (min) (ACUTE ONLY): 24 min   Charges:   PT Evaluation $PT Eval Low Complexity: 1 Low PT Treatments $Therapeutic Activity: 8-22 mins PT General Charges $$ ACUTE PT VISIT: 1 Visit         [place], PT Acute Rehab   [place] VEAR Drone 10/29/2024, 4:55 PM

## 2024-10-29 NOTE — Plan of Care (Signed)

## 2024-10-29 NOTE — H&P (Signed)
 " History and Physical  JULIEN BERRYMAN FMW:[id number] DOB: Feb 20, 1941 DOA: 10/28/2024  PCP: Iven Lang DASEN, PA-C   Chief Complaint: Progressive confusion, falls  HPI: Bethany Wolfe is a 84 y.o. female with medical history significant for osteoarthritis who was brought to the emergency department by her husband and grandson who stated that she has had progressive confusion over the last 3 weeks, and has had multiple falls at home.  On initial arrival to the emergency department, apparently the patient was speaking and conversing, stated that she was having some back pain and also described losing her balance at home last night, falling backwards and hitting her head on a door frame.  Husband stated that over the last couple of days she has been having some visual hallucinations, seeing objects and people that were not present.  Currently, the patient is very somnolent and difficult to arouse.  It seems that she was given a small dose of IV fentanyl  in the emergency department.  So far, workup is relatively unrevealing including lab work and imaging below.  She does have a slight leukocytosis.  She also has bilateral leg wraps, these were removed by me personally at the time of evaluation and there does appear to be evidence of some mild cellulitis in the right lower extremity as photographed below.  I attempted to call her husband, but there was no answer.  Review of Systems: Please see HPI for pertinent positives and negatives. A complete 10 system review of systems are otherwise negative.  Past Medical History:  Diagnosis Date   Asthma    Humerus fracture 06/2023   left   Past Surgical History:  Procedure Laterality Date   ABDOMINAL HYSTERECTOMY     PARTIAL, STILL HAS OVARIES   ANKLE SURGERY  1969   RIGHT, SCREWS   ANTERIOR INTEROSSEOUS NERVE DECOMPRESSION Left 07/14/2023   Procedure: ANTERIOR INTEROSSEOUS NERVE DECOMPRESSION;  Surgeon: Cristy Bonner DASEN, MD;  Location: [facility];  Service: Orthopedics;  Laterality: Left;   APPENDECTOMY  1959   COLONOSCOPY     LUMBAR DISC SURGERY  1980   ruptured disc   ORIF HUMERUS FRACTURE Left 07/14/2023   Procedure: OPEN REDUCTION INTERNAL FIXATION (ORIF) DISTAL HUMERUS FRACTURE;  Surgeon: Cristy Bonner DASEN, MD;  Location: [facility];  Service: Orthopedics;  Laterality: Left;   Social History:  reports that she quit smoking about 31 years ago. Her smoking use included cigarettes. She started smoking about 49 years ago. She has a 9 pack-year smoking history. She has been exposed to tobacco smoke. She has never used smokeless tobacco. She reports that she does not drink alcohol and does not use drugs.  Allergies[1]  Family History  Problem Relation Age of Onset   Lymphoma Mother    Lung cancer Father        smoker     Prior to Admission medications  Medication Sig Start Date End Date Taking? Authorizing Provider  acetaminophen  (TYLENOL ) 650 MG CR tablet Take 650 mg by mouth every 8 (eight) hours as needed for pain.    [provider]  albuterol  (ACCUNEB ) 1.25 MG/3ML nebulizer solution Take 3 mLs (1.25 mg total) by nebulization every 6 (six) hours as needed for wheezing. 08/11/23   Chandra Toribio POUR, MD  albuterol  (VENTOLIN  HFA) 108 [phone] Base) MCG/ACT inhaler TAKE 2 PUFFS BY MOUTH EVERY 6 HOURS AS NEEDED FOR WHEEZE OR SHORTNESS OF BREATH 02/02/24   Chandra Toribio POUR, MD  aspirin  81 MG  chewable tablet Chew 1 tablet (81 mg total) by mouth 2 (two) times daily. Patient not taking: Reported on 10/24/2024 08/05/23     Cholecalciferol  (VITAMIN D3) 50 MCG (2000 UT) capsule Take 1 capsule (2,000 Units total) by mouth daily. 11/09/23   Chandra Toribio POUR, MD  fluticasone  (FLONASE ) 50 MCG/ACT nasal spray Place 2 sprays into both nostrils daily. 10/24/24   Rothfuss, Jacob T, PA-C  indapamide  (LOZOL ) 1.25 MG tablet TAKE 1 TABLET BY MOUTH EVERY DAY 08/29/24   Rothfuss, Jacob T, PA-C  losartan  (COZAAR ) 50 MG tablet Take 1  tablet (50 mg total) by mouth daily. 08/15/24   Rothfuss, Jacob T, PA-C  mupirocin  ointment (BACTROBAN ) 2 % Apply topically daily. Apply to area on buttock. 07/02/24   Rothfuss, Jacob T, PA-C  pantoprazole  (PROTONIX ) 40 MG tablet Take 1 tablet (40 mg total) by mouth daily. 08/15/24   Rothfuss, Jacob T, PA-C  triamcinolone  ointment (KENALOG ) 0.5 % Apply 1 Application topically 2 (two) times daily. Use for only one week at a time before taking one week off. 07/02/24   Rothfuss, Jacob T, PA-C  urea  (CARMOL) 40 % CREA Apply topically to legs 2 (two) times daily for 1-2 weeks 08/27/24       Physical Exam: BP 105/62 (BP Location: Right Arm)   Pulse 80   Temp 99.5 F (37.5 C) (Rectal)   Resp 17   Ht 5' 4.96 (1.65 m)   Wt 107.5 kg   SpO2 91%   BMI 39.47 kg/m  General: Elderly female in no acute distress, appears her stated age.  Somnolent but arousable, following some commands and then falls back asleep.  She is oriented to self and year.  She does not endorse any complaints. Cardiovascular: RRR, no murmurs or rubs, no peripheral edema  Respiratory: clear to auscultation bilaterally, no wheezes, no crackles  Abdomen: soft, nontender, nondistended, normal bowel tones heard  Skin: dry, no rashes, chronic appearing bilateral lower extremity wounds, including a large ulcerated wound in the right calf with signs of healing.  There is some evidence of cellulitis with warmth and erythema around the right ankle. Musculoskeletal: no joint effusions, normal range of motion  Psychiatric: appropriate affect, normal speech  Neurologic: extraocular muscles intact, clear speech, moving all extremities with intact sensorium           Labs on Admission:  Basic Metabolic Panel: Recent Labs  Lab 10/28/24 2240  NA 140  K 3.9  CL 102  CO2 29  GLUCOSE 133*  BUN 22  CREATININE 1.17*  CALCIUM 10.3   Liver Function Tests: Recent Labs  Lab 10/29/24 0148  AST 30  ALT 14  ALKPHOS 94  BILITOT 0.4  PROT  6.7  ALBUMIN 3.6   No results for input(s): LIPASE, AMYLASE in the last 168 hours. No results for input(s): AMMONIA in the last 168 hours. CBC: Recent Labs  Lab 10/28/24 2240  WBC 12.8*  HGB 12.6  HCT 39.6  MCV 83.0  PLT 373   Cardiac Enzymes: No results for input(s): CKTOTAL, CKMB, CKMBINDEX, TROPONINI in the last 168 hours. BNP (last 3 results) No results for input(s): BNP in the last 8760 hours.  ProBNP (last 3 results) Recent Labs    10/29/24 0148  PROBNP 191.0    CBG: No results for input(s): GLUCAP in the last 168 hours.  Radiological Exams on Admission: CT Angio Chest PE W/Cm &/Or Wo Cm Result Date: 10/29/2024 EXAM: CTA CHEST 10/29/2024 03:46:01 AM TECHNIQUE: CTA of the  chest was performed without and with the administration of 75 mL of iohexol  (OMNIPAQUE ) 350 MG/ML injection. Multiplanar reformatted images are provided for review. MIP images are provided for review. Automated exposure control, iterative reconstruction, and/or weight based adjustment of the mA/kV was utilized to reduce the radiation dose to as low as reasonably achievable. COMPARISON: 07/08/2023 CLINICAL HISTORY: Pulmonary embolism (PE) suspected, high prob. FINDINGS: PULMONARY ARTERIES: Pulmonary arteries are adequately opacified for evaluation. No acute pulmonary embolus. Main pulmonary artery is normal in caliber. MEDIASTINUM: The heart and pericardium demonstrate no acute abnormality. Coronary artery and aortic atherosclerosis. There is no acute abnormality of the thoracic aorta. LYMPH NODES: No mediastinal, hilar or axillary lymphadenopathy. LUNGS AND PLEURA: 11 mm left upper lobe pulmonary nodule on image 49. No focal consolidation or pulmonary edema. No evidence of pleural effusion or pneumothorax. UPPER ABDOMEN: Small hiatal hernia. Limited images of the upper abdomen are otherwise unremarkable. SOFT TISSUES AND BONES: No acute bone or soft tissue abnormality. IMPRESSION: 1. No  pulmonary embolism. 2. No acute cardiopulmonary disease. 3. Coronary artery disease, aortic atherosclerosis. 4. Small hiatal hernia. 5. 11 mm left upper lobe pulmonary nodule. This is stable since prior study. This is only minimally changed since 2014 . Findings compatible with the benign lesion. Electronically signed by: Franky Crease MD 10/29/2024 04:00 AM EST RP Workstation: [id number]   DG Humerus Right Result Date: 10/29/2024 EXAM: 1 VIEW(S) XRAY OF THE RIGHT HUMERUS 10/29/2024 12:28:59 AM COMPARISON: None available. CLINICAL HISTORY: Pain after unremarkable fall. FINDINGS: BONES AND JOINTS: Degenerative changes in the glenohumeral joint. 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 Degenerative changes in the elbow. No acute fracture or dislocation. No malalignment. SOFT TISSUES: The soft tissues are unremarkable. IMPRESSION: 1. No acute fracture or dislocation. Electronically signed by: Elsie Gravely MD 10/29/2024 12:36 AM EST RP Workstation: [id number]   DG Hand Complete Right Result Date: 10/29/2024 EXAM: 3 OR MORE VIEW(S) XRAY OF THE RIGHT HAND 10/29/2024 12:28:59 AM COMPARISON: None available. CLINICAL HISTORY: Pain after unremarkable fall. FINDINGS: BONES AND JOINTS: No evidence of acute fracture or dislocation. Degenerative changes are present in the interphalangeal joints, first metacarpophalangeal and first carpometacarpal joints, radiocarpal joints, and STT joints. Old and united ossicles are noted over the ulnar styloid process. Calcification is present in the triangular fibrocartilage. Subcortical cysts are seen in the distal ulna and trapezium, consistent with degenerative cysts. SOFT TISSUES: The soft tissues are unremarkable. IMPRESSION: 1. No acute fracture or dislocation. Electronically signed by: Elsie Gravely MD 10/29/2024 12:35 AM EST RP Workstation: [id number]   DG Femur Min 2 Views Left Result Date: 10/29/2024 EXAM: 2 VIEW(S) XRAY OF THE LEFT FEMUR  10/29/2024 12:28:59 AM COMPARISON: None available. CLINICAL HISTORY: Pain after unremarkable fall. FINDINGS: BONES AND JOINTS: No acute bony abnormalities. Mild degenerative changes in the hip and knee. No malalignment. SOFT TISSUES: The soft tissues are unremarkable. IMPRESSION: 1. No acute bony abnormalities. 2. Mild degenerative changes in the hip and knee. Electronically signed by: Elsie Gravely MD 10/29/2024 12:34 AM EST RP Workstation: [id number]   DG Forearm Right Result Date: 10/29/2024 EXAM: VIEW(S) XRAY OF THE RIGHT FOREARM 10/29/2024 12:28:59 AM COMPARISON: None available. CLINICAL HISTORY: FALL. Pain after unremarkable fall. FINDINGS: BONES AND JOINTS: No evidence of acute fracture or dislocation. No malalignment. Degenerative changes in the elbow and wrist. Subcortical cysts in the distal ulna are likely degenerative. Old ununited ossicle over the ulnar styloid process. SOFT TISSUES: The soft tissues are unremarkable. IMPRESSION: 1. No acute fracture or dislocation.  Electronically signed by: Elsie Gravely MD 10/29/2024 12:33 AM EST RP Workstation: [id number]   CT Head Wo Contrast Result Date: 10/28/2024 EXAM: CT HEAD AND CERVICAL SPINE 10/28/2024 11:06:09 PM TECHNIQUE: CT of the head and cervical spine was performed without the administration of intravenous contrast. Multiplanar reformatted images are provided for review. Automated exposure control, iterative reconstruction, and/or weight based adjustment of the mA/kV was utilized to reduce the radiation dose to as low as reasonably achievable. COMPARISON: Cervical spine CT and head CT, both 07/08/2023. CLINICAL HISTORY: The patient fell and hit her head. FINDINGS: CT HEAD BRAIN AND VENTRICLES: There is mild global atrophy, mild atrophic ventriculomegaly and small vessel disease in the cerebral white matter, unchanged. There is calcification in the carotid siphons and distal vertebral arteries. No hyperdense vessel is evident. No acute  intracranial hemorrhage. No mass effect or midline shift. No abnormal extra-axial fluid collection. No evidence of acute infarct. No hydrocephalus. ORBITS: No acute abnormality. SINUSES AND MASTOIDS: No acute abnormality. SOFT TISSUES AND SKULL: There is osteopenia without evidence of fractures or focal pathologic process. No acute skull fracture. No acute soft tissue abnormality. CT CERVICAL SPINE BONES AND ALIGNMENT: There is a chronic straightened cervical lordosis. Minimal grade 1 anterolisthesis related to facet hypertrophy again noted C3-C4 and C7-T1. This was seen previously. No other listhesis is seen. No traumatic listhesis. Bone on bone anterior atlantoaxial joint joint space loss is chronically noted with retroodontoid calcific pannus and with reactive osteophytes. There is osteopenia without evidence of fractures or focal pathologic process. No acute fracture or traumatic malalignment. DEGENERATIVE CHANGES: The discs are diffusely degenerated, with disc space calcification and bidirectional osteophytes. Dorsal endplate osteophytes at C5-C6 again cause a mild compression of the ventral cord, other levels with lesser encroachment of the ventral cord surface by dorsal osteophytes. Facet joint and uncinate spurring is seen causing moderate bilateral foraminal stenosis at C3-C4, moderate to severe left foraminal stenosis at C4-C5, moderate to severe bilateral foraminal stenosis at C5-C6 and mild foraminal stenosis at C6-C7. SOFT TISSUES: No prevertebral soft tissue swelling. No thyroid  or laryngeal mass is seen. There is calcific plaque at the carotid bifurcations. IMPRESSION: 1. No acute intracranial CT findings or depressed skull fractures. 2. No acute fracture or traumatic malalignment of the cervical spine. 3. Osteopenia With multilevel degenerative changes. Electronically signed by: Francis Quam MD 10/28/2024 11:31 PM EST RP Workstation: [id number]   CT Cervical Spine Wo Contrast Result Date:  10/28/2024 EXAM: CT HEAD AND CERVICAL SPINE 10/28/2024 11:06:09 PM TECHNIQUE: CT of the head and cervical spine was performed without the administration of intravenous contrast. Multiplanar reformatted images are provided for review. Automated exposure control, iterative reconstruction, and/or weight based adjustment of the mA/kV was utilized to reduce the radiation dose to as low as reasonably achievable. COMPARISON: Cervical spine CT and head CT, both 07/08/2023. CLINICAL HISTORY: The patient fell and hit her head. FINDINGS: CT HEAD BRAIN AND VENTRICLES: There is mild global atrophy, mild atrophic ventriculomegaly and small vessel disease in the cerebral white matter, unchanged. There is calcification in the carotid siphons and distal vertebral arteries. No hyperdense vessel is evident. No acute intracranial hemorrhage. No mass effect or midline shift. No abnormal extra-axial fluid collection. No evidence of acute infarct. No hydrocephalus. ORBITS: No acute abnormality. SINUSES AND MASTOIDS: No acute abnormality. SOFT TISSUES AND SKULL: There is osteopenia without evidence of fractures or focal pathologic process. No acute skull fracture. No acute soft tissue abnormality. CT CERVICAL SPINE BONES AND ALIGNMENT: There is a  chronic straightened cervical lordosis. Minimal grade 1 anterolisthesis related to facet hypertrophy again noted C3-C4 and C7-T1. This was seen previously. No other listhesis is seen. No traumatic listhesis. Bone on bone anterior atlantoaxial joint joint space loss is chronically noted with retroodontoid calcific pannus and with reactive osteophytes. There is osteopenia without evidence of fractures or focal pathologic process. No acute fracture or traumatic malalignment. DEGENERATIVE CHANGES: The discs are diffusely degenerated, with disc space calcification and bidirectional osteophytes. Dorsal endplate osteophytes at C5-C6 again cause a mild compression of the ventral cord, other levels with  lesser encroachment of the ventral cord surface by dorsal osteophytes. Facet joint and uncinate spurring is seen causing moderate bilateral foraminal stenosis at C3-C4, moderate to severe left foraminal stenosis at C4-C5, moderate to severe bilateral foraminal stenosis at C5-C6 and mild foraminal stenosis at C6-C7. SOFT TISSUES: No prevertebral soft tissue swelling. No thyroid  or laryngeal mass is seen. There is calcific plaque at the carotid bifurcations. IMPRESSION: 1. No acute intracranial CT findings or depressed skull fractures. 2. No acute fracture or traumatic malalignment of the cervical spine. 3. Osteopenia With multilevel degenerative changes. Electronically signed by: Francis Quam MD 10/28/2024 11:31 PM EST RP Workstation: [id number]   DG Hip Unilat W or Wo Pelvis 2-3 Views Right Result Date: 10/28/2024 CLINICAL DATA:  Unwitnessed fall EXAM: DG HIP (WITH OR WITHOUT PELVIS) 2-3V RIGHT COMPARISON:  01/29/2010 report FINDINGS: Limited by soft tissue artifact and habitus. SI joints are non widened. Pubic symphysis and rami appear intact. No definitive fracture or malalignment. Moderate hip degenerative changes. IMPRESSION: Limited by soft tissue artifact and habitus. No definitive acute osseous abnormality. Cross-sectional imaging follow-up if persistent concern for fracture Electronically Signed   By: Luke Bun M.D.   On: 10/28/2024 23:03   DG Chest 2 View Result Date: 10/28/2024 CLINICAL DATA:  Unwitnessed fall EXAM: CHEST - 2 VIEW COMPARISON:  CT 07/08/2023 FINDINGS: No acute airspace disease or pleural effusion. Normal cardiomediastinal silhouette with aortic atherosclerosis. No pneumothorax. CT demonstrated left-sided pulmonary nodule not well seen by radiography. IMPRESSION: No active cardiopulmonary disease. Electronically Signed   By: Luke Bun M.D.   On: 10/28/2024 23:02   Assessment/Plan CHYANN AMBROCIO is a 84 y.o. female with medical history significant for osteoarthritis who was  brought to the emergency department by her husband and grandson with complaints of multiple falls at home, progressive confusion found to have sepsis likely related to right lower extremity cellulitis.  Severe sepsis-meeting criteria with tachycardia, leukocytosis.  Endorgan dysfunction with encephalopathy.  Source is likely right lower extremity cellulitis. -Inpatient admission -Monitor closely on telemetry -Empiric IV vancomycin , obtain and trend lactic acid -Follow-up blood cultures  Encephalopathy-likely toxic or metabolic, patient does not appear to be on any sedating medications, per the medical chart it seems that she was awake and alert and speaking during triage.  At some point, she received IV fentanyl  and since then has been encephalopathic.  She is now starting to wake up, and answering questions appropriately.  Imaging as above is unremarkable for any acute injury, brain bleed or stroke. -Continue to treat sepsis as above -Only judicious use of narcotics and other sedating medications -Follow-up MRI brain  Right lower extremity wound-overall appears to be healing well, with no surrounding cellulitis, and granulation tissue at the base. -Wound care consult  Right lower extremity cellulitis-around the ankle, as photographed above.  Possibly the source of her sepsis, as no other source is identified -Empiric IV vancomycin  -Follow-up blood cultures  DVT prophylaxis: Lovenox      Code Status: Full Code for now, patient will be presumed to be full code until I am able to get in touch with her husband and confirm her CODE STATUS.  Consults called: None  Admission status: The appropriate patient status for this patient is INPATIENT. Inpatient status is judged to be reasonable and necessary in order to provide the required intensity of service to ensure the patient's safety. The patient's presenting symptoms, physical exam findings, and initial radiographic and laboratory data in the  context of their chronic comorbidities is felt to place them at high risk for further clinical deterioration. Furthermore, it is not anticipated that the patient will be medically stable for discharge from the hospital within 2 midnights of admission.    I certify that at the point of admission it is my clinical judgment that the patient will require inpatient hospital care spanning beyond 2 midnights from the point of admission due to high intensity of service, high risk for further deterioration and high frequency of surveillance required  Time spent: 59 minutes  Bethany Leoni CHRISTELLA Gail MD [facility] Pager [phone]  If 7PM-7AM, please contact night-coverage [email or web address] Password [email or web address]  10/29/2024, 8:01 AM      [1]  Allergies Allergen Reactions   Alendronate Sodium Other (See Comments)    GI Upset (intolerance)   Codeine Nausea And Vomiting    SICK ON STOMACH   Lovastatin Nausea Only   Penicillin G Hives    Big whelps   Pravastatin Other (See Comments)     Myalgias (intolerance)   Tetanus Toxoid Hives   Tetanus Toxoid, Adsorbed Hives   Penicillins Rash   Tetanus Toxoid-Containing Vaccines Rash   "

## 2024-10-29 NOTE — Consult Note (Signed)
 WOC Nurse Consult Note: Reason for Consult: R LE wound  Patient with history of significant hematoma and wounds to the RLE early December.  Admitted for fall at home.  Wound type: Trauma with history of venous disease.  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 Frederik Drucilla [phone]). Eucerin to the RLE daily, not between toes Paint distal RLE wound with betadine, allow to air dry. Cover with dry dressing.    Consider FU in a WCC of the patient's choice for wound healing/progression, with the closed wound edges would benefit from outpatient serial debridements.    Re consult if needed, will not follow at this time. Thanks  Breyona Swander [employer], RN,CWOCN, CNS, [employer] [phone]

## 2024-10-29 NOTE — Consult Note (Signed)
 WOC Nurse Consult Note: see previous consult for lower extremity wound Reason for Consult: sacral wound Wound type: Unstageable Pressure Injury sacrum and B buttocks 4 separate areas with 100% tan nonviable tissue  Pressure Injury POA: Yes (mentioned in PCP note dating back to 06/2024)  Measurement: see nursing flowsheet  Wound bed: as above  Drainage (amount, consistency, odor) see nursing flowsheet  Periwound:erythema, likely chronic tissue damage (widespread purple discoloration)  Dressing procedure/placement/frequency: Cleanse sacral and buttocks wounds with Vashe, do not rinse.  Apply 1/4 thick layer of Santyl  to wound beds daily, cover with saline moist gauze, dry gauze and silicone foam or ABD pad and clothe tape.    POC discussed with bedside nurse. WOC team will not follow. Reconsult if further needs arise.   Thank you,    Powell Bar MSN, RN-BC, [employer]

## 2024-10-30 DIAGNOSIS — L0390 Cellulitis, unspecified: Secondary | ICD-10-CM | POA: Diagnosis not present

## 2024-10-30 DIAGNOSIS — A419 Sepsis, unspecified organism: Secondary | ICD-10-CM | POA: Diagnosis not present

## 2024-10-30 LAB — VITAMIN B12: Vitamin B-12: 273 pg/mL (ref 180–914)

## 2024-10-30 LAB — BASIC METABOLIC PANEL WITH GFR
Anion gap: 8 (ref 5–15)
BUN: 13 mg/dL (ref 8–23)
CO2: 27 mmol/L (ref 22–32)
Calcium: 9.3 mg/dL (ref 8.9–10.3)
Chloride: 105 mmol/L (ref 98–111)
Creatinine, Ser: 0.74 mg/dL (ref 0.44–1.00)
GFR, Estimated: >60 mL/min
Glucose, Bld: 110 mg/dL — ABNORMAL HIGH (ref 70–99)
Potassium: 3.6 mmol/L (ref 3.5–5.1)
Sodium: 140 mmol/L (ref 135–145)

## 2024-10-30 LAB — CBC
HCT: 34.1 % — ABNORMAL LOW (ref 36.0–46.0)
Hemoglobin: 10.5 g/dL — ABNORMAL LOW (ref 12.0–15.0)
MCH: 26.1 pg (ref 26.0–34.0)
MCHC: 30.8 g/dL (ref 30.0–36.0)
MCV: 84.8 fL (ref 80.0–100.0)
Platelets: 250 K/uL (ref 150–400)
RBC: 4.02 MIL/uL (ref 3.87–5.11)
RDW: 14 % (ref 11.5–15.5)
WBC: 7.4 K/uL (ref 4.0–10.5)
nRBC: 0 % (ref 0.0–0.2)

## 2024-10-30 MED ORDER — ACETAMINOPHEN 500 MG PO TABS
1000.0000 mg | ORAL_TABLET | Freq: Four times a day (QID) | ORAL | Status: DC
  Administered 2024-10-30 – 2024-10-31 (×4): 1000 mg via ORAL
  Filled 2024-10-30 (×6): qty 2

## 2024-10-30 MED ORDER — SODIUM CHLORIDE 0.9 % IV SOLN: INTRAVENOUS | Status: AC

## 2024-10-30 NOTE — TOC Initial Note (Signed)
 Transition of Care [facility]) - Initial/Assessment Note    Patient Details  Name: Bethany Wolfe MRN: [id number] Date of Birth: 1941/05/04  Transition of Care [facility]) CM/SW Contact:    Heather DELENA Saltness, LCSW Phone Number: 10/30/2024, 2:14 PM  Clinical Narrative:                 Pt admitted from home due to unwitnessed fall and increasing confusion. PT recommending short-term SNF rehab. CSW attempted to speak with pt's spouse, Lakedra Washington, via phone call to discuss discharge planning. No answer, voicemail left requesting return phone call. TOC will continue to follow.    Expected Discharge Plan: Skilled Nursing Facility Barriers to Discharge: Continued Medical Work up   Patient Goals and CMS Choice Patient states their goals for this hospitalization and ongoing recovery are:: TBD   Choice offered to / list presented to : NA [place] ownership interest in [facility].provided to:: Parent NA    Expected Discharge Plan and Services In-house Referral: Clinical Social Work Discharge Planning Services: NA Post Acute Care Choice: NA Living arrangements for the past 2 months: Single Family Home                 DME Arranged: N/A DME Agency: NA       HH Arranged: NA HH Agency: NA        Prior Living Arrangements/Services Living arrangements for the past 2 months: Single Family Home Lives with:: Self, Spouse Patient language and need for interpreter reviewed:: Yes Do you feel safe going back to the place where you live?: Yes      Need for Family Participation in Patient Care: Yes (Comment) Care giver support system in place?: Yes (comment)   Criminal Activity/Legal Involvement Pertinent to Current Situation/Hospitalization: No - Comment as needed  Activities of Daily Living   ADL Screening (condition at time of admission) Independently performs ADLs?: No Does the patient have a NEW difficulty with bathing/dressing/toileting/self-feeding that is expected to last  >3 days?: Yes (Initiates electronic notice to provider for possible OT consult) Does the patient have a NEW difficulty with getting in/out of bed, walking, or climbing stairs that is expected to last >3 days?: Yes (Initiates electronic notice to provider for possible PT consult) Does the patient have a NEW difficulty with communication that is expected to last >3 days?: No Is the patient deaf or have difficulty hearing?: No Does the patient have difficulty seeing, even when wearing glasses/contacts?: No Does the patient have difficulty concentrating, remembering, or making decisions?: No  Permission Sought/Granted Permission sought to share information with : Facility Medical Sales Representative, Case Manager, Family Supports Permission granted to share information with : Yes, Verbal Permission Granted  Share Information with NAME: Jamesyn Moorefield  Permission granted to share info w AGENCY: SNF  Permission granted to share info w Relationship: Spouse  Permission granted to share info w Contact Information: [phone]  Emotional Assessment Appearance:: Appears stated age Attitude/Demeanor/Rapport: Unable to Assess Affect (typically observed): Unable to Assess Orientation: : Oriented to Self, Oriented to Place, Oriented to  Time, Fluctuating Orientation (Suspected and/or reported Sundowners) Alcohol / Substance Use: Not Applicable Psych Involvement: No (comment)  Admission diagnosis:  Altered mental status, unspecified altered mental status type [R41.82] Sepsis due to cellulitis (HCC) [L03.90, A41.9] Patient Active Problem List   Diagnosis Date Noted   Sepsis due to cellulitis (HCC) 10/29/2024   Prediabetes 08/17/2024   Pressure injury of buttock, stage 1 08/17/2024   Urge incontinence 07/16/2024  Atypical chest pain 04/23/2024   Stasis dermatitis of both legs 04/23/2024   Gastroesophageal reflux disease with esophagitis without hemorrhage 04/23/2024   Morbid obesity (HCC) 04/23/2024    Panic attacks 09/08/2023   Unsteady gait 08/22/2023   Leg edema 08/11/2023   Vitamin D  deficiency 08/11/2023   Primary osteoarthritis of knees, 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   Visit for annual health examination 03/04/2016   Coronary artery calcification 02/11/2016   Diverticulosis of large intestine without hemorrhage 02/11/2016   Essential hypertension 02/11/2016   Hyperlipidemia LDL goal <100 02/11/2016   Mild intermittent asthma 02/11/2016   Class 3 severe obesity due to excess calories with serious comorbidity and body mass index (BMI) of 40.0 to 44.9 in adult [facility]) 02/11/2016   Osteopenia 02/11/2016   PCP:  Iven Lang DASEN, PA-C Pharmacy:   [facility] [address] [place] [address] Phone: [phone] Fax: [phone]  [id number] - [place], [place] - [address] [place] [address] Phone: [phone] Fax: [phone]   Social Drivers of Health (SDOH) Social History: SDOH Screenings   Food Insecurity: No Food Insecurity (10/29/2024)  Housing: Low Risk (10/29/2024)  Transportation Needs: No Transportation Needs (10/29/2024)  Utilities: Not At Risk (10/29/2024)  Alcohol Screen: Low Risk (03/22/2024)  Depression (PHQ2-9): Medium Risk (10/03/2024)  Financial Resource Strain: Low Risk (03/22/2024)  Physical Activity: Inactive (03/22/2024)  Social Connections: Moderately Isolated (10/29/2024)  Stress: No Stress Concern Present (03/22/2024)  Tobacco Use: Medium Risk (10/28/2024)  Health Literacy: Adequate Health Literacy (03/22/2024)   SDOH Interventions: None     Readmission Risk Interventions    10/30/2024    2:12 PM  Readmission Risk Prevention Plan  Post Dischage Appt Complete  Medication Screening Complete  Transportation Screening Complete    Signed: Heather Saltness, MSW, LCSW Clinical Social Worker Inpatient Care Management 10/30/2024 2:16 PM

## 2024-10-30 NOTE — Plan of Care (Signed)

## 2024-10-30 NOTE — Progress Notes (Signed)
 " PROGRESS NOTE    Bethany Wolfe  FMW:[id number] DOB: 1941/09/01 DOA: 10/28/2024 PCP: Iven Lang DASEN, PA-C    Brief Narrative:  84 year old with multiple falls at home for last 3 weeks, progressive confusion brought to the ER by family.  In the emergency room initially patient reported some back pain and losing her balance.  Family reported visual hallucinations. In the emergency room WBC count 12K.  Electrolytes adequate.  Found to have leg infection.  Patient underwent extensive skeletal survey including CT scans of the head and cervical spine followed by MRI of the brain and cervical spine X-rays of the both arms, legs, hips as well CT angiogram of the chest with no acute findings.  Admitted to treat for cellulitis and altered mentation.  Subjective: Patient seen and examined.  Remains eyes closed.  On further interview, patient describes some pain on her right elbow.  She denied any hallucinations or delusions.  Patient is poor historian.  Was difficult to keep awake.  Nurse reported that patient responded appropriately during the breakfast.  No family at the bedside.   Assessment & Plan:   Severe sepsis, meets criteria with tachycardia and leukocytosis.  Encephalopathy.  Possible source of infection right lower extremity cellulitis-open wound with history of hematoma. Blood cultures drawn. Currently no surgical need. Will treat with Ancef . Seen by wound care, local wound care.  Patient will need ongoing follow-up at wound care clinic.  Acute encephalopathy likely metabolic. Not on any medication causing drowsiness or encephalopathy. MRI brain and MRI cervical spine, incompletely studies however no obvious abnormalities. TSH, ammonia, UDS negative. Will check B12 levels. Focus on mobility.  Avoid benzodiazepines. Will use around-the-clock Tylenol  for pain control.     DVT prophylaxis: enoxaparin  (LOVENOX ) injection 40 mg Start: 10/29/24 1000   Code Status: Full  code Family Communication: None at the bedside Disposition Plan: Status is: Inpatient Remains inpatient appropriate because: Severe symptomatic.  Confused.  Encephalopathy.     Consultants:  Wound care  Procedures:  None  Antimicrobials:  Ancef  1/5---     Objective: Vitals:   10/29/24 1618 10/29/24 2119 10/30/24 0003 10/30/24 0536  BP: 110/69 (!) 113/38 (!) 113/52 111/61  Pulse: 95 81 75 74  Resp: 20 (!) 22 (!) 24 (!) 22  Temp: 97.9 F (36.6 C) 99.1 F (37.3 C) (!) 97.5 F (36.4 C) 98.6 F (37 C)  TempSrc: Oral Oral Oral Oral  SpO2: 95% 92% 95% 99%  Weight:      Height:        Intake/Output Summary (Last 24 hours) at 10/30/2024 1051 Last data filed at 10/30/2024 1035 Gross per 24 hour  Intake 683 ml  Output 525 ml  Net 158 ml   Filed Weights   10/29/24 0302 10/29/24 1213  Weight: 107.5 kg 106.6 kg    Examination:  General exam: Chronically sick looking.  Frail.  Not in any distress.  On minimal oxygen. Respiratory system: Clear to auscultation. Respiratory effort normal.  No added sounds. Cardiovascular system: S1 & S2 heard, RRR.  Gastrointestinal system: Abdomen is nondistended, soft and nontender. No organomegaly or masses felt. Normal bowel sounds heard. Central nervous system: Alert and awake.  She is oriented to place and person.  Gross generalized weakness. Skin:  Patient has some tender swelling of the right elbow without open wound. Right leg with open ulceration with some surrounding cellulitis. Stage III pressure ulcer bilateral buttocks as in the picture.        Data Reviewed:  I have personally reviewed following labs and imaging studies  CBC: Recent Labs  Lab 10/28/24 2240 10/30/24 0545  WBC 12.8* 7.4  HGB 12.6 10.5*  HCT 39.6 34.1*  MCV 83.0 84.8  PLT 373 250   Basic Metabolic Panel: Recent Labs  Lab 10/28/24 2240 10/30/24 0545  NA 140 140  K 3.9 3.6  CL 102 105  CO2 29 27  GLUCOSE 133* 110*  BUN 22 13  CREATININE  1.17* 0.74  CALCIUM 10.3 9.3   GFR: Estimated Creatinine Clearance: 64.6 mL/min (by C-G formula based on SCr of 0.74 mg/dL). Liver Function Tests: Recent Labs  Lab 10/29/24 0148  AST 30  ALT 14  ALKPHOS 94  BILITOT 0.4  PROT 6.7  ALBUMIN 3.6   No results for input(s): LIPASE, AMYLASE in the last 168 hours. Recent Labs  Lab 10/29/24 0733  AMMONIA 50*   Coagulation Profile: No results for input(s): INR, PROTIME in the last 168 hours. Cardiac Enzymes: No results for input(s): CKTOTAL, CKMB, CKMBINDEX, TROPONINI in the last 168 hours. BNP (last 3 results) Recent Labs    10/29/24 0148  PROBNP 191.0   HbA1C: No results for input(s): HGBA1C in the last 72 hours. CBG: No results for input(s): GLUCAP in the last 168 hours. Lipid Profile: No results for input(s): CHOL, HDL, LDLCALC, TRIG, CHOLHDL, LDLDIRECT in the last 72 hours. Thyroid  Function Tests: Recent Labs    10/29/24 0733  TSH 1.530   Anemia Panel: No results for input(s): VITAMINB12, FOLATE, FERRITIN, TIBC, IRON, RETICCTPCT in the last 72 hours. Sepsis Labs: Recent Labs  Lab 10/29/24 0835  LATICACIDVEN 1.1    Recent Results (from the past 240 hours)  Culture, blood (Routine X 2) w Reflex to ID Panel     Status: None (Preliminary result)   Collection Time: 10/29/24  8:35 AM   Specimen: BLOOD  Result Value Ref Range Status   Specimen Description   Final    BLOOD LEFT ANTECUBITAL Performed at [facility], [address] [place], [place] [address]    Special Requests   Final    BOTTLES DRAWN AEROBIC AND ANAEROBIC Blood Culture results may not be optimal due to an inadequate volume of blood received in culture bottles Performed at [facility], [address] [place], [place] [address]    Culture   Final    NO GROWTH < 24 HOURS Performed at [facility], [address] [place], [place] [address]    Report Status  PENDING  Incomplete  Culture, blood (Routine X 2) w Reflex to ID Panel     Status: None (Preliminary result)   Collection Time: 10/29/24  8:40 AM   Specimen: BLOOD LEFT HAND  Result Value Ref Range Status   Specimen Description   Final    BLOOD LEFT HAND Performed at [facility], [address] [place], [place] [address]    Special Requests   Final    BOTTLES DRAWN AEROBIC AND ANAEROBIC Blood Culture adequate volume Performed at [facility], [address] [place], [place] [address]    Culture   Final    NO GROWTH < 24 HOURS Performed at [facility], [address] [place], [place] [address]    Report Status PENDING  Incomplete         Radiology Studies: MR Cervical Spine Wo Contrast Result Date: 10/29/2024 EXAM: MRI CERVICAL SPINE WITHOUT CONTRAST 10/29/2024 03:23:09 PM TECHNIQUE: Multiplanar multisequence  MRI of the cervical spine was performed. COMPARISON: CT cervical spine 10/28/2024. CLINICAL HISTORY: neuro deficit, R hand weakness FINDINGS: LIMITATIONS/ARTIFACTS: The examination is severely motion degraded. BONES AND ALIGNMENT: Cervical spine straightening. Preserved vertebral body heights. Multilevel disc space narrowing and chronic degenerative endplate changes. SPINAL CORD: Nondiagnostic assessment of the spinal cord due to motion. SOFT TISSUES: No paraspinal mass. DISC LEVELS: Detailed assessment of degenerative changes and resultant stenoses is precluded by the degree of motion artifact. There is no evidence of high grade spinal canal stenosis on the sagittal T2 sequence. IMPRESSION: 1. Severely motion degraded, largely nondiagnostic examination as described above. Electronically signed by: Dasie Hamburg MD 10/29/2024 03:54 PM EST RP Workstation: [id number]   MR BRAIN WO CONTRAST Result Date: 10/29/2024 EXAM: MRI BRAIN WITHOUT CONTRAST 10/29/2024 03:23:09 PM TECHNIQUE: Multiplanar multisequence MRI of the head/brain was performed without the  administration of intravenous contrast. COMPARISON: CT head 10/28/2024. CLINICAL HISTORY: Neuro deficit, acute, stroke suspected. FINDINGS: LIMITATIONS: The examination is moderately motion degraded. BRAIN AND VENTRICLES: There is no evidence of an acute infarct, intracranial hemorrhage, mass, midline shift, hydrocephalus, or extra-axial fluid collection. Mild cerebral atrophy is within normal limits for age. T2 hyperintensities in the cerebral white matter are nonspecific but compatible with mild chronic small vessel ischemic disease, also considered to be within normal limits for age. An enlarged, partially empty sella is noted. Major intracranial vascular flow voids are preserved. ORBITS: No acute abnormality. SINUSES AND MASTOIDS: No acute abnormality. BONES AND SOFT TISSUES: Normal marrow signal. No acute soft tissue abnormality. IMPRESSION: 1. No acute intracranial abnormality identified on this motion degraded examination. Electronically signed by: Dasie Hamburg MD 10/29/2024 03:50 PM EST RP Workstation: [id number]   CT Angio Chest PE W/Cm &/Or Wo Cm Result Date: 10/29/2024 EXAM: CTA CHEST 10/29/2024 03:46:01 AM TECHNIQUE: CTA of the chest was performed without and with the administration of 75 mL of iohexol  (OMNIPAQUE ) 350 MG/ML injection. Multiplanar reformatted images are provided for review. MIP images are provided for review. Automated exposure control, iterative reconstruction, and/or weight based adjustment of the mA/kV was utilized to reduce the radiation dose to as low as reasonably achievable. COMPARISON: 07/08/2023 CLINICAL HISTORY: Pulmonary embolism (PE) suspected, high prob. FINDINGS: PULMONARY ARTERIES: Pulmonary arteries are adequately opacified for evaluation. No acute pulmonary embolus. Main pulmonary artery is normal in caliber. MEDIASTINUM: The heart and pericardium demonstrate no acute abnormality. Coronary artery and aortic atherosclerosis. There is no acute abnormality of the thoracic  aorta. LYMPH NODES: No mediastinal, hilar or axillary lymphadenopathy. LUNGS AND PLEURA: 11 mm left upper lobe pulmonary nodule on image 49. No focal consolidation or pulmonary edema. No evidence of pleural effusion or pneumothorax. UPPER ABDOMEN: Small hiatal hernia. Limited images of the upper abdomen are otherwise unremarkable. SOFT TISSUES AND BONES: No acute bone or soft tissue abnormality. IMPRESSION: 1. No pulmonary embolism. 2. No acute cardiopulmonary disease. 3. Coronary artery disease, aortic atherosclerosis. 4. Small hiatal hernia. 5. 11 mm left upper lobe pulmonary nodule. This is stable since prior study. This is only minimally changed since 2014 . Findings compatible with the benign lesion. Electronically signed by: Franky Crease MD 10/29/2024 04:00 AM EST RP Workstation: [id number]   DG Humerus Right Result Date: 10/29/2024 EXAM: 1 VIEW(S) XRAY OF THE RIGHT HUMERUS 10/29/2024 12:28:59 AM COMPARISON: None available. CLINICAL HISTORY: Pain after unremarkable fall. FINDINGS: BONES AND JOINTS: Degenerative changes in the glenohumeral joint. 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 Degenerative changes in the elbow. No acute fracture or dislocation. No  malalignment. SOFT TISSUES: The soft tissues are unremarkable. IMPRESSION: 1. No acute fracture or dislocation. Electronically signed by: Elsie Gravely MD 10/29/2024 12:36 AM EST RP Workstation: [id number]   DG Hand Complete Right Result Date: 10/29/2024 EXAM: 3 OR MORE VIEW(S) XRAY OF THE RIGHT HAND 10/29/2024 12:28:59 AM COMPARISON: None available. CLINICAL HISTORY: Pain after unremarkable fall. FINDINGS: BONES AND JOINTS: No evidence of acute fracture or dislocation. Degenerative changes are present in the interphalangeal joints, first metacarpophalangeal and first carpometacarpal joints, radiocarpal joints, and STT joints. Old and united ossicles are noted over the ulnar styloid process. Calcification is present in the  triangular fibrocartilage. Subcortical cysts are seen in the distal ulna and trapezium, consistent with degenerative cysts. SOFT TISSUES: The soft tissues are unremarkable. IMPRESSION: 1. No acute fracture or dislocation. Electronically signed by: Elsie Gravely MD 10/29/2024 12:35 AM EST RP Workstation: [id number]   DG Femur Min 2 Views Left Result Date: 10/29/2024 EXAM: 2 VIEW(S) XRAY OF THE LEFT FEMUR 10/29/2024 12:28:59 AM COMPARISON: None available. CLINICAL HISTORY: Pain after unremarkable fall. FINDINGS: BONES AND JOINTS: No acute bony abnormalities. Mild degenerative changes in the hip and knee. No malalignment. SOFT TISSUES: The soft tissues are unremarkable. IMPRESSION: 1. No acute bony abnormalities. 2. Mild degenerative changes in the hip and knee. Electronically signed by: Elsie Gravely MD 10/29/2024 12:34 AM EST RP Workstation: [id number]   DG Forearm Right Result Date: 10/29/2024 EXAM: VIEW(S) XRAY OF THE RIGHT FOREARM 10/29/2024 12:28:59 AM COMPARISON: None available. CLINICAL HISTORY: FALL. Pain after unremarkable fall. FINDINGS: BONES AND JOINTS: No evidence of acute fracture or dislocation. No malalignment. Degenerative changes in the elbow and wrist. Subcortical cysts in the distal ulna are likely degenerative. Old ununited ossicle over the ulnar styloid process. SOFT TISSUES: The soft tissues are unremarkable. IMPRESSION: 1. No acute fracture or dislocation. Electronically signed by: Elsie Gravely MD 10/29/2024 12:33 AM EST RP Workstation: [id number]   CT Head Wo Contrast Result Date: 10/28/2024 EXAM: CT HEAD AND CERVICAL SPINE 10/28/2024 11:06:09 PM TECHNIQUE: CT of the head and cervical spine was performed without the administration of intravenous contrast. Multiplanar reformatted images are provided for review. Automated exposure control, iterative reconstruction, and/or weight based adjustment of the mA/kV was utilized to reduce the radiation dose to as low as reasonably  achievable. COMPARISON: Cervical spine CT and head CT, both 07/08/2023. CLINICAL HISTORY: The patient fell and hit her head. FINDINGS: CT HEAD BRAIN AND VENTRICLES: There is mild global atrophy, mild atrophic ventriculomegaly and small vessel disease in the cerebral white matter, unchanged. There is calcification in the carotid siphons and distal vertebral arteries. No hyperdense vessel is evident. No acute intracranial hemorrhage. No mass effect or midline shift. No abnormal extra-axial fluid collection. No evidence of acute infarct. No hydrocephalus. ORBITS: No acute abnormality. SINUSES AND MASTOIDS: No acute abnormality. SOFT TISSUES AND SKULL: There is osteopenia without evidence of fractures or focal pathologic process. No acute skull fracture. No acute soft tissue abnormality. CT CERVICAL SPINE BONES AND ALIGNMENT: There is a chronic straightened cervical lordosis. Minimal grade 1 anterolisthesis related to facet hypertrophy again noted C3-C4 and C7-T1. This was seen previously. No other listhesis is seen. No traumatic listhesis. Bone on bone anterior atlantoaxial joint joint space loss is chronically noted with retroodontoid calcific pannus and with reactive osteophytes. There is osteopenia without evidence of fractures or focal pathologic process. No acute fracture or traumatic malalignment. DEGENERATIVE CHANGES: The discs are diffusely degenerated, with disc space calcification and bidirectional osteophytes. Dorsal endplate osteophytes at C5-C6  again cause a mild compression of the ventral cord, other levels with lesser encroachment of the ventral cord surface by dorsal osteophytes. Facet joint and uncinate spurring is seen causing moderate bilateral foraminal stenosis at C3-C4, moderate to severe left foraminal stenosis at C4-C5, moderate to severe bilateral foraminal stenosis at C5-C6 and mild foraminal stenosis at C6-C7. SOFT TISSUES: No prevertebral soft tissue swelling. No thyroid  or laryngeal mass is  seen. There is calcific plaque at the carotid bifurcations. IMPRESSION: 1. No acute intracranial CT findings or depressed skull fractures. 2. No acute fracture or traumatic malalignment of the cervical spine. 3. Osteopenia With multilevel degenerative changes. Electronically signed by: Francis Quam MD 10/28/2024 11:31 PM EST RP Workstation: [id number]   CT Cervical Spine Wo Contrast Result Date: 10/28/2024 EXAM: CT HEAD AND CERVICAL SPINE 10/28/2024 11:06:09 PM TECHNIQUE: CT of the head and cervical spine was performed without the administration of intravenous contrast. Multiplanar reformatted images are provided for review. Automated exposure control, iterative reconstruction, and/or weight based adjustment of the mA/kV was utilized to reduce the radiation dose to as low as reasonably achievable. COMPARISON: Cervical spine CT and head CT, both 07/08/2023. CLINICAL HISTORY: The patient fell and hit her head. FINDINGS: CT HEAD BRAIN AND VENTRICLES: There is mild global atrophy, mild atrophic ventriculomegaly and small vessel disease in the cerebral white matter, unchanged. There is calcification in the carotid siphons and distal vertebral arteries. No hyperdense vessel is evident. No acute intracranial hemorrhage. No mass effect or midline shift. No abnormal extra-axial fluid collection. No evidence of acute infarct. No hydrocephalus. ORBITS: No acute abnormality. SINUSES AND MASTOIDS: No acute abnormality. SOFT TISSUES AND SKULL: There is osteopenia without evidence of fractures or focal pathologic process. No acute skull fracture. No acute soft tissue abnormality. CT CERVICAL SPINE BONES AND ALIGNMENT: There is a chronic straightened cervical lordosis. Minimal grade 1 anterolisthesis related to facet hypertrophy again noted C3-C4 and C7-T1. This was seen previously. No other listhesis is seen. No traumatic listhesis. Bone on bone anterior atlantoaxial joint joint space loss is chronically noted with  retroodontoid calcific pannus and with reactive osteophytes. There is osteopenia without evidence of fractures or focal pathologic process. No acute fracture or traumatic malalignment. DEGENERATIVE CHANGES: The discs are diffusely degenerated, with disc space calcification and bidirectional osteophytes. Dorsal endplate osteophytes at C5-C6 again cause a mild compression of the ventral cord, other levels with lesser encroachment of the ventral cord surface by dorsal osteophytes. Facet joint and uncinate spurring is seen causing moderate bilateral foraminal stenosis at C3-C4, moderate to severe left foraminal stenosis at C4-C5, moderate to severe bilateral foraminal stenosis at C5-C6 and mild foraminal stenosis at C6-C7. SOFT TISSUES: No prevertebral soft tissue swelling. No thyroid  or laryngeal mass is seen. There is calcific plaque at the carotid bifurcations. IMPRESSION: 1. No acute intracranial CT findings or depressed skull fractures. 2. No acute fracture or traumatic malalignment of the cervical spine. 3. Osteopenia With multilevel degenerative changes. Electronically signed by: Francis Quam MD 10/28/2024 11:31 PM EST RP Workstation: [id number]   DG Hip Unilat W or Wo Pelvis 2-3 Views Right Result Date: 10/28/2024 CLINICAL DATA:  Unwitnessed fall EXAM: DG HIP (WITH OR WITHOUT PELVIS) 2-3V RIGHT COMPARISON:  01/29/2010 report FINDINGS: Limited by soft tissue artifact and habitus. SI joints are non widened. Pubic symphysis and rami appear intact. No definitive fracture or malalignment. Moderate hip degenerative changes. IMPRESSION: Limited by soft tissue artifact and habitus. No definitive acute osseous abnormality. Cross-sectional imaging follow-up if persistent concern for  fracture Electronically Signed   By: Luke Bun M.D.   On: 10/28/2024 23:03   DG Chest 2 View Result Date: 10/28/2024 CLINICAL DATA:  Unwitnessed fall EXAM: CHEST - 2 VIEW COMPARISON:  CT 07/08/2023 FINDINGS: No acute airspace disease  or pleural effusion. Normal cardiomediastinal silhouette with aortic atherosclerosis. No pneumothorax. CT demonstrated left-sided pulmonary nodule not well seen by radiography. IMPRESSION: No active cardiopulmonary disease. Electronically Signed   By: Luke Bun M.D.   On: 10/28/2024 23:02        Scheduled Meds:  acetaminophen   1,000 mg Oral Q6H   collagenase    Topical Daily   enoxaparin  (LOVENOX ) injection  40 mg Subcutaneous Q24H   hydrocerin   Topical Daily   Continuous Infusions:  sodium chloride  100 mL/hr at 10/30/24 0936    ceFAZolin  (ANCEF ) IV 2 g (10/30/24 0515)     LOS: 1 day    Time spent: 52 minutes    Renato Applebaum, MD [facility]   "

## 2024-10-30 NOTE — Plan of Care (Signed)
?  Problem: Health Behavior/Discharge Planning: ?Goal: Ability to manage health-related needs will improve ?Outcome: Progressing ?  ?Problem: Activity: ?Goal: Risk for activity intolerance will decrease ?Outcome: Progressing ?  ?Problem: Elimination: ?Goal: Will not experience complications related to bowel motility ?Outcome: Progressing ?  ?

## 2024-10-31 DIAGNOSIS — L0390 Cellulitis, unspecified: Secondary | ICD-10-CM | POA: Diagnosis not present

## 2024-10-31 DIAGNOSIS — A419 Sepsis, unspecified organism: Secondary | ICD-10-CM | POA: Diagnosis not present

## 2024-10-31 MED ORDER — ASPIRIN 81 MG PO CHEW
81.0000 mg | CHEWABLE_TABLET | Freq: Two times a day (BID) | ORAL | Status: DC
  Administered 2024-10-31 – 2024-11-07 (×15): 81 mg via ORAL
  Filled 2024-10-31 (×15): qty 1

## 2024-10-31 MED ORDER — PANTOPRAZOLE SODIUM 40 MG PO TBEC
40.0000 mg | DELAYED_RELEASE_TABLET | Freq: Every day | ORAL | Status: DC
  Administered 2024-10-31 – 2024-11-07 (×8): 40 mg via ORAL
  Filled 2024-10-31 (×8): qty 1

## 2024-10-31 MED ORDER — VITAMIN D 25 MCG (1000 UNIT) PO TABS
2000.0000 [IU] | ORAL_TABLET | Freq: Every day | ORAL | Status: DC
  Administered 2024-10-31 – 2024-11-07 (×8): 2000 [IU] via ORAL
  Filled 2024-10-31 (×8): qty 2

## 2024-10-31 NOTE — Progress Notes (Signed)
 " PROGRESS NOTE    Bethany Wolfe  FMW:[id number] DOB: Nov 02, 1940 DOA: 10/28/2024 PCP: Iven Lang DASEN, PA-C    Brief Narrative:  84 year old with multiple falls at home for last 3 weeks, progressive confusion brought to the ER by family.  In the emergency room initially patient reported some back pain and losing her balance.  Family reported visual hallucinations. In the emergency room WBC count 12K.  Electrolytes adequate.  Found to have leg infection.  Patient underwent extensive skeletal survey including CT scans of the head and cervical spine followed by MRI of the brain and cervical spine X-rays of the both arms, legs, hips as well CT angiogram of the chest with no acute findings.  Admitted to treat for cellulitis and altered mentation.  Subjective: Patient seen and examined.  She is sleepy but wakes up and answers appropriately.  Nursing staff reported that she was in her normal self all day yesterday and interacted with family.  No family at the bedside.  Patient herself denied any complaints to me.   Assessment & Plan:   Severe sepsis, meets criteria with tachycardia and leukocytosis.  Encephalopathy.  Possible source of infection right lower extremity cellulitis-open wound with history of hematoma. Blood cultures drawn, negative so far. Currently no surgical need. Will treat with Ancef .  Will continue Ancef  today.  Will discharge patient with 2 weeks of oral antibiotics when she gets a SNF bed. Seen by wound care, local wound care.  Patient will need ongoing follow-up at wound care clinic.  Acute encephalopathy likely metabolic. Not on any medication causing drowsiness or encephalopathy. MRI brain and MRI cervical spine, incompletely studies however no obvious abnormalities. TSH, ammonia, UDS negative. Will check B12 levels. Focus on mobility.  Avoid benzodiazepines. Will use around-the-clock Tylenol  for pain control.  Hypertension: Patient on multiple blood pressure  medications.  Her blood pressures are normal.  Holding indapamide  and losartan .     DVT prophylaxis: enoxaparin  (LOVENOX ) injection 40 mg Start: 10/29/24 1000   Code Status: Full code Family Communication: None at the bedside.  Husband unable to pick up the phone. Disposition Plan: Status is: Inpatient Remains inpatient appropriate because: Medically stabilizing.  Needs to go to a skilled nursing facility.     Consultants:  Wound care  Procedures:  None  Antimicrobials:  Ancef  1/5---     Objective: Vitals:   10/30/24 0536 10/30/24 1315 10/30/24 2017 10/31/24 0450  BP: 111/61 100/60 108/63 124/74  Pulse: 74 74 72 78  Resp: (!) 22 18 18 18   Temp: 98.6 F (37 C) 98.6 F (37 C) 98.3 F (36.8 C) 97.9 F (36.6 C)  TempSrc: Oral  Oral Oral  SpO2: 99% 98% 91% 92%  Weight:      Height:        Intake/Output Summary (Last 24 hours) at 10/31/2024 1100 Last data filed at 10/30/2024 2000 Gross per 24 hour  Intake 741.67 ml  Output 400 ml  Net 341.67 ml   Filed Weights   10/29/24 0302 10/29/24 1213  Weight: 107.5 kg 106.6 kg    Examination:  General exam: Chronically sick looking.  Frail.  Respiratory system: Clear to auscultation. Respiratory effort normal.  No added sounds. Cardiovascular system: S1 & S2 heard, RRR.  Gastrointestinal system: Abdomen is nondistended, soft and nontender. No organomegaly or masses felt. Normal bowel sounds heard. Central nervous system: Alert and awake.  He is mostly oriented.  Gross generalized weakness. Skin:  Patient has some tender swelling of the right  elbow without open wound. Right leg with open ulceration with some surrounding cellulitis. Stage III pressure ulcer bilateral buttocks as in the picture.        Data Reviewed: I have personally reviewed following labs and imaging studies  CBC: Recent Labs  Lab 10/28/24 2240 10/30/24 0545  WBC 12.8* 7.4  HGB 12.6 10.5*  HCT 39.6 34.1*  MCV 83.0 84.8  PLT 373 250    Basic Metabolic Panel: Recent Labs  Lab 10/28/24 2240 10/30/24 0545  NA 140 140  K 3.9 3.6  CL 102 105  CO2 29 27  GLUCOSE 133* 110*  BUN 22 13  CREATININE 1.17* 0.74  CALCIUM 10.3 9.3   GFR: Estimated Creatinine Clearance: 64.6 mL/min (by C-G formula based on SCr of 0.74 mg/dL). Liver Function Tests: Recent Labs  Lab 10/29/24 0148  AST 30  ALT 14  ALKPHOS 94  BILITOT 0.4  PROT 6.7  ALBUMIN 3.6   No results for input(s): LIPASE, AMYLASE in the last 168 hours. Recent Labs  Lab 10/29/24 0733  AMMONIA 50*   Coagulation Profile: No results for input(s): INR, PROTIME in the last 168 hours. Cardiac Enzymes: No results for input(s): CKTOTAL, CKMB, CKMBINDEX, TROPONINI in the last 168 hours. BNP (last 3 results) Recent Labs    10/29/24 0148  PROBNP 191.0   HbA1C: No results for input(s): HGBA1C in the last 72 hours. CBG: No results for input(s): GLUCAP in the last 168 hours. Lipid Profile: No results for input(s): CHOL, HDL, LDLCALC, TRIG, CHOLHDL, LDLDIRECT in the last 72 hours. Thyroid  Function Tests: Recent Labs    10/29/24 0733  TSH 1.530   Anemia Panel: Recent Labs    10/30/24 0545  VITAMINB12 273   Sepsis Labs: Recent Labs  Lab 10/29/24 0835  LATICACIDVEN 1.1    Recent Results (from the past 240 hours)  Culture, blood (Routine X 2) w Reflex to ID Panel     Status: None (Preliminary result)   Collection Time: 10/29/24  8:35 AM   Specimen: BLOOD  Result Value Ref Range Status   Specimen Description   Final    BLOOD LEFT ANTECUBITAL Performed at [facility], [address], [place], [place] [address]    Special Requests   Final    BOTTLES DRAWN AEROBIC AND ANAEROBIC Blood Culture results may not be optimal due to an inadequate volume of blood received in culture bottles Performed at [facility], [address], [place], [place] [address]    Culture   Final    NO  GROWTH 2 DAYS Performed at [facility], [address], [place], [place] [address]    Report Status PENDING  Incomplete  Culture, blood (Routine X 2) w Reflex to ID Panel     Status: None (Preliminary result)   Collection Time: 10/29/24  8:40 AM   Specimen: BLOOD LEFT HAND  Result Value Ref Range Status   Specimen Description   Final    BLOOD LEFT HAND Performed at [facility], [address], [place], [place] [address]    Special Requests   Final    BOTTLES DRAWN AEROBIC AND ANAEROBIC Blood Culture adequate volume Performed at [facility], [address], [place], [place] [address]    Culture   Final    NO GROWTH 2 DAYS Performed at [facility], [address], [place], [place] [address]    Report Status PENDING  Incomplete  Radiology Studies: MR Cervical Spine Wo Contrast Result Date: 10/29/2024 EXAM: MRI CERVICAL SPINE WITHOUT CONTRAST 10/29/2024 03:23:09 PM TECHNIQUE: Multiplanar multisequence MRI of the cervical spine was performed. COMPARISON: CT cervical spine 10/28/2024. CLINICAL HISTORY: neuro deficit, R hand weakness FINDINGS: LIMITATIONS/ARTIFACTS: The examination is severely motion degraded. BONES AND ALIGNMENT: Cervical spine straightening. Preserved vertebral body heights. Multilevel disc space narrowing and chronic degenerative endplate changes. SPINAL CORD: Nondiagnostic assessment of the spinal cord due to motion. SOFT TISSUES: No paraspinal mass. DISC LEVELS: Detailed assessment of degenerative changes and resultant stenoses is precluded by the degree of motion artifact. There is no evidence of high grade spinal canal stenosis on the sagittal T2 sequence. IMPRESSION: 1. Severely motion degraded, largely nondiagnostic examination as described above. Electronically signed by: Dasie Hamburg MD 10/29/2024 03:54 PM EST RP Workstation: [id number]   MR BRAIN WO CONTRAST Result Date: 10/29/2024 EXAM: MRI BRAIN WITHOUT CONTRAST  10/29/2024 03:23:09 PM TECHNIQUE: Multiplanar multisequence MRI of the head/brain was performed without the administration of intravenous contrast. COMPARISON: CT head 10/28/2024. CLINICAL HISTORY: Neuro deficit, acute, stroke suspected. FINDINGS: LIMITATIONS: The examination is moderately motion degraded. BRAIN AND VENTRICLES: There is no evidence of an acute infarct, intracranial hemorrhage, mass, midline shift, hydrocephalus, or extra-axial fluid collection. Mild cerebral atrophy is within normal limits for age. T2 hyperintensities in the cerebral white matter are nonspecific but compatible with mild chronic small vessel ischemic disease, also considered to be within normal limits for age. An enlarged, partially empty sella is noted. Major intracranial vascular flow voids are preserved. ORBITS: No acute abnormality. SINUSES AND MASTOIDS: No acute abnormality. BONES AND SOFT TISSUES: Normal marrow signal. No acute soft tissue abnormality. IMPRESSION: 1. No acute intracranial abnormality identified on this motion degraded examination. Electronically signed by: Dasie Hamburg MD 10/29/2024 03:50 PM EST RP Workstation: [id number]        Scheduled Meds:  acetaminophen   1,000 mg Oral Q6H   aspirin   81 mg Oral BID   collagenase    Topical Daily   enoxaparin  (LOVENOX ) injection  40 mg Subcutaneous Q24H   hydrocerin   Topical Daily   pantoprazole   40 mg Oral Daily   Vitamin D3  2,000 Units Oral Daily   Continuous Infusions:   ceFAZolin  (ANCEF ) IV 2 g (10/31/24 0542)     LOS: 2 days      Renato Applebaum, MD [facility]   "

## 2024-10-31 NOTE — NC FL2 (Signed)
 " [place]  MEDICAID [id number] LEVEL OF CARE FORM     IDENTIFICATION  Patient Name: Bethany Wolfe Birthdate: 09-30-1941 Sex: female Admission Date (Current Location): 10/28/2024  [facility] and [place] Number:      Facility and Address:  [facility],  [address], [place] [address]      Provider Number: [id number]  Attending Physician Name and Address:  Raenelle Coria, MD  Relative Name and Phone Number:  Fontella, Shan  [facility]), Emergency Contact  [phone]    Current Level of Care: Hospital Recommended Level of Care: Skilled Nursing Facility Prior Approval Number:    Date Approved/Denied:   PASRR Number: [id number]  Discharge Plan: SNF    Current Diagnoses: Patient Active Problem List   Diagnosis Date Noted   Sepsis due to cellulitis (HCC) 10/29/2024   Prediabetes 08/17/2024   Pressure injury of buttock, stage 1 08/17/2024   Urge incontinence 07/16/2024   Atypical chest pain 04/23/2024   Stasis dermatitis of both legs 04/23/2024   Gastroesophageal reflux disease with esophagitis without hemorrhage 04/23/2024   Morbid obesity (HCC) 04/23/2024   Panic attacks 09/08/2023   Unsteady gait 08/22/2023   Leg edema 08/11/2023   Vitamin D  deficiency 08/11/2023   Primary osteoarthritis of knees, 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   Visit for annual health examination 03/04/2016   Coronary artery calcification 02/11/2016   Diverticulosis of large intestine without hemorrhage 02/11/2016   Essential hypertension 02/11/2016   Hyperlipidemia LDL goal <100 02/11/2016   Mild intermittent asthma 02/11/2016   Class 3 severe obesity due to excess calories with serious comorbidity and body mass index (BMI) of 40.0 to 44.9 in adult [facility]) 02/11/2016   Osteopenia 02/11/2016    Orientation RESPIRATION BLADDER Height & Weight     Self, Time, Situation, Place  Normal Incontinent Weight: 235 lb (106.6 kg) Height:  5' 5 (165.1 cm)  BEHAVIORAL SYMPTOMS/MOOD NEUROLOGICAL BOWEL  NUTRITION STATUS      Continent Diet (Regular)  AMBULATORY STATUS COMMUNICATION OF NEEDS Skin   Extensive Assist Verbally Other (Comment) (Infection Pretibial Right, Pressure Injury Buttocks Right;Left;Posterior Stage 2, and Pressure Injury Sacrum Unstageable)                       Personal Care Assistance Level of Assistance  Bathing, Feeding, Dressing Bathing Assistance: Maximum assistance Feeding assistance: Independent Dressing Assistance: Maximum assistance     Functional Limitations Info  Sight, Hearing, Speech Sight Info: Adequate Hearing Info: Adequate Speech Info: Adequate    SPECIAL CARE FACTORS FREQUENCY  PT (By licensed PT), OT (By licensed OT)     PT Frequency: 5x per week OT Frequency: 5x per week            Contractures Contractures Info: Not present    Additional Factors Info  Code Status, Allergies Code Status Info: FULL Allergies Info: Alendronate Sodium, Codeine, Lovastatin, Penicillin G, Pravastatin, Tetanus Toxoid, Tetanus Toxoid, Adsorbed, Penicillins, Tetanus Toxoid-containing Vaccines           Current Medications (10/31/2024):  This is the current hospital active medication list Current Facility-Administered Medications  Medication Dose Route Frequency Provider Last Rate Last Admin   acetaminophen  (TYLENOL ) tablet 1,000 mg  1,000 mg Oral Q6H Ghimire, Kuber, MD   1,000 mg at 10/30/24 1741   albuterol  (PROVENTIL ) (2.5 MG/3ML) 0.083% nebulizer solution 2.5 mg  2.5 mg Nebulization Q2H PRN Zella, Mir M, MD       aspirin  chewable tablet 81 mg  81 mg Oral BID Ghimire,  Renato, MD       ceFAZolin  (ANCEF ) IVPB 2g/100 mL premix  2 g Intravenous Q8H Zella, Mir M, MD 200 mL/hr at 10/31/24 0542 2 g at 10/31/24 0542   cholecalciferol  (VITAMIN D3) 25 MCG (1000 UNIT) tablet 2,000 Units  2,000 Units Oral Daily Raenelle Renato, MD       collagenase  (SANTYL ) ointment   Topical Daily Zella Katha HERO, MD   Given at 10/30/24 9078   enoxaparin  (LOVENOX )  injection 40 mg  40 mg Subcutaneous Q24H Zella, Mir M, MD   40 mg at 10/31/24 0900   hydrocerin (EUCERIN) cream   Topical Daily Zella, Mir M, MD   Given at 10/31/24 9144   iohexol  (OMNIPAQUE ) 300 MG/ML solution 75 mL  75 mL Intravenous Once PRN Griselda Norris, MD       ondansetron  (ZOFRAN ) tablet 4 mg  4 mg Oral Q6H PRN Zella, Mir M, MD       Or   ondansetron  (ZOFRAN ) injection 4 mg  4 mg Intravenous Q6H PRN Zella, Mir M, MD       pantoprazole  (PROTONIX ) EC tablet 40 mg  40 mg Oral Daily Ghimire, Kuber, MD       traZODone  (DESYREL ) tablet 25 mg  25 mg Oral QHS PRN Zella Katha HERO, MD         Discharge Medications: Please see discharge summary for a list of discharge medications.  Relevant Imaging Results:  Relevant Lab Results:   Additional Information SSN: [id number]  Heather LABOR Milayah Krell, LCSW     "

## 2024-10-31 NOTE — Evaluation (Signed)
 Occupational Therapy Evaluation Patient Details Name: Bethany Wolfe MRN: [id number] DOB: 01-30-1941 Today's Date: 10/31/2024   History of Present Illness   84 yr old female who presented to the ED secondary to 5 falls in the past 3 weeks, one of which on date of admission 10/28/2024. Pt reportedly fell backward and struck her head, unclear of LOC and pt is not currently reporting vision changes. Pt with complaint of B hip pain, R  elbow, chest and low back pain. Family endorses progressive confusion over the past 3 wks and well as decline with UE motor control and coordination. Pt found to have LE wounds and hematoma with sepsis secondary to R LE cellulitis, as well as encephalopathy. PMH includes but is not limited to: HTN, HLD, GERD, asthma, L humeral fracture s/p ORIF (2024), lumbar surgery and R ankle surgery.     Clinical Impressions The pt is currently presenting with the below listed deficits (see OT problem list). As such, her ADL performance is compromised and she is requiring assist for self-care management. During the session today, she required mod assist x2 for supine to sit, CGA seated EOB for upper body grooming, mod assist x2 to stand using a RW, and mod assist x2 to take lateral steps along the EOB of the bed using a RW. She reported feelings of weakness, quick fatigue, and a general fear of falling. She will benefit from OT services to maximize her independence with ADLs and to decrease the risk for further weakness and deconditioning. Patient will benefit from continued inpatient follow up therapy, <3 hours/day.      If plan is discharge home, recommend the following:   A lot of help with walking and/or transfers;A lot of help with bathing/dressing/bathroom;Help with stairs or ramp for entrance;Assist for transportation;Assistance with cooking/housework     Functional Status Assessment   Patient has had a recent decline in their functional status and demonstrates the  ability to make significant improvements in function in a reasonable and predictable amount of time.     Equipment Recommendations   BSC/3in1     Recommendations for Other Services         Precautions/Restrictions   Precautions Precautions: Fall Restrictions Weight Bearing Restrictions Per Provider Order: No     Mobility Bed Mobility Overal bed mobility: Needs Assistance Bed Mobility: Supine to Sit, Sit to Supine     Supine to sit: Mod assist, +2 for physical assistance, +2 for safety/equipment, HOB elevated, Used rails Sit to supine: Max assist, +2 for physical assistance, +2 for safety/equipment   General bed mobility comments: Pt with good initation of B LEs toward EOB, increased effort noted. Ultimately required MOD A+2 for completion to EOB and use of chuck pad for scooting to edge in prep for sit to stand transfers. Requires encouragment for stand attempts, due to fear of falling    Transfers Overall transfer level: Needs assistance Equipment used: Rolling walker (2 wheels) Transfers: Sit to/from Stand Sit to Stand: Mod assist, +2 physical assistance, +2 safety/equipment           General transfer comment: MOD A+2 for power up to stand, pt able to take multiple side shuffles toward R. Cues for forward gaze, upright posture in standing   Balance     Sitting balance-Leahy Scale: Fair       Standing balance-Leahy Scale: Poor          ADL either performed or assessed with clinical judgement   ADL Overall ADL's :  Needs assistance/impaired Eating/Feeding: Set up;Sitting   Grooming: Contact guard assist;Sitting Grooming Details (indicate cue type and reason): She performed face washing and teeth brushing seated EOB. Upper Body Bathing: Contact guard assist;Sitting   Lower Body Bathing: Maximal assistance;Sitting/lateral leans   Upper Body Dressing : Minimal assistance;Sitting   Lower Body Dressing: Maximal assistance;Sitting/lateral leans Lower  Body Dressing Details (indicate cue type and reason): she required significant assist to donn socks seated EOB Toilet Transfer: Moderate assistance;+2 for physical assistance;Rolling walker (2 wheels);BSC/3in1;Cueing for sequencing;Stand-pivot   Toileting- Clothing Manipulation and Hygiene: Maximal assistance;Sit to/from stand;Cueing for compensatory techniques;Cueing for sequencing Toileting - Clothing Manipulation Details (indicate cue type and reason): at bedside commode level, based on clinical judgement             Vision   Additional Comments: She correctly read the time depicted on the wall clock.            Pertinent Vitals/Pain Pain Assessment Pain Assessment: No/denies pain     Extremity/Trunk Assessment Upper Extremity Assessment Upper Extremity Assessment: Right hand dominant;LUE deficits/detail;RUE deficits/detail RUE Deficits / Details: Reports new difficulty with grip/holding utensils, chronic shoulder AROM limitations, elbow and hand AROM WFL. Grip strength 4-/5, mildly impaired finger to nose and digit opposition, intermittent tingling of hand however she denies numbness  LUE Deficits / Details: chronic shoulder AROM limitations, elbow and hand AROM WFL. Grip strength 4/5   Lower Extremity Assessment Lower Extremity Assessment: Generalized weakness       Communication Communication Communication: No apparent difficulties   Cognition Arousal: Alert Behavior During Therapy: Anxious               OT - Cognition Comments: Oriented x4        Following commands: Intact Following commands impaired: Follows one step commands with increased time     Cueing  General Comments   Cueing Techniques: Verbal cues;Tactile cues             Home Living Family/patient expects to be discharged to:: Private residence Living Arrangements: Spouse/significant other Available Help at Discharge: Family Type of Home: House Home Access: Stairs to  enter [employer] of Steps: 3 in front, 2 +1 side door Entrance Stairs-Rails: Can reach both Home Layout: One level     Bathroom Shower/Tub: Producer, Television/film/video: Handicapped height     Home Equipment: Rollator (4 wheels);Cane - single point   Additional Comments: husband can assist as needed at home, has other family that live nearby and reports they call and check in on daily.      Prior Functioning/Environment Prior Level of Function : Independent/Modified Independent             Mobility Comments: quad cane at baseline. ~5 falls in past year. sleeps in lift recliner since fall last september due to difficulty laying flat feels like weight on my chest ADLs Comments: Reports independent with bathing, dressing (spouse assist with socks and wears slip on shoes), cooks, and cleans at home. Does not drive ~8bm since L elbow surgery.    OT Problem List: Decreased strength;Decreased range of motion;Decreased activity tolerance;Impaired balance (sitting and/or standing);Decreased coordination;Decreased knowledge of use of DME or AE   OT Treatment/Interventions: Self-care/ADL training;Therapeutic exercise;Energy conservation;DME and/or AE instruction;Therapeutic activities;Balance training;Patient/family education      OT Goals(Current goals can be found in the care plan section)   Acute Rehab OT Goals OT Goal Formulation: With patient Time For Goal Achievement: 11/14/24 Potential to Achieve Goals: Good  ADL Goals Pt Will Perform Lower Body Dressing: with contact guard assist;sitting/lateral leans;with adaptive equipment Pt Will Transfer to Toilet: with contact guard assist;stand pivot transfer;bedside commode Pt Will Perform Toileting - Clothing Manipulation and hygiene: with contact guard assist;sit to/from stand Pt/caregiver will Perform Home Exercise Program: With theraband;With Supervision;Increased strength;Right Upper extremity;Left upper  extremity Additional ADL Goal #1: The pt will perform bed mobility with CGA, in prep for progressive ADL participation.   OT Frequency:  Min 2X/week    Co-evaluation PT/OT/SLP Co-Evaluation/Treatment: Yes Reason for Co-Treatment: For patient/therapist safety;To address functional/ADL transfers PT goals addressed during session: Mobility/safety with mobility;Proper use of DME OT goals addressed during session: ADL's and self-care;Strengthening/ROM      AM-PAC OT 6 Clicks Daily Activity     Outcome Measure Help from another person eating meals?: A Little Help from another person taking care of personal grooming?: A Little Help from another person toileting, which includes using toliet, bedpan, or urinal?: A Lot Help from another person bathing (including washing, rinsing, drying)?: A Lot Help from another person to put on and taking off regular upper body clothing?: A Little Help from another person to put on and taking off regular lower body clothing?: A Lot 6 Click Score: 15   End of Session Equipment Utilized During Treatment: Gait belt;Rolling walker (2 wheels) Nurse Communication: Mobility status  Activity Tolerance: Other (comment) (Fair tolerance) Patient left: in bed;with call bell/phone within reach;with bed alarm set  OT Visit Diagnosis: Unsteadiness on feet (R26.81);Other abnormalities of gait and mobility (R26.89);Muscle weakness (generalized) (M62.81);History of falling (Z91.81)                Time: 8586-8544 OT Time Calculation (min): 42 min Charges:  OT General Charges $OT Visit: 1 Visit OT Evaluation $OT Eval Moderate Complexity: 1 Mod OT Treatments $Self Care/Home Management : 8-22 mins    Delanna JINNY Lesches, OTR/L 10/31/2024, 5:38 PM

## 2024-10-31 NOTE — Progress Notes (Signed)
 Physical Therapy Treatment Patient Details Name: Bethany Wolfe MRN: [id number] DOB: 1941/01/12 Today's Date: 10/31/2024   History of Present Illness 84 yo female presents to therapy from ED secondary to 5 falls in the past 3 wks one of which on date of admission 10/28/2024. Pt reportedly fell backward and struck her head, unclear of LOC and pt is not currently reporting vision changes or N and V. Pt c/o B hip pain, R  elbow, chest and LBP. Family endorses progressive confusion over the past 3 wks and well as decline with UE motor control and coordination. Pt found to have LE wounds and hematoma and dx with sepsis secondary to R LE cellulitis as well as encephalopathy. Pt PMH includes but is not limited to: HTN, HLD, GERD, asthma, L humeral fx s/p ORIF (2024), lumbar surgery and R ankle surgery.    PT Comments  Pt is progressing toward acute PT goals this session with progression to transfers. Pt performed bed mobility with MOD -MAX A+2 and sit to stand transfer with MOD A+2 able to take multiple side shuffle steps in standing. More PLOF/home environment information obtained this session. Pt questionable historian, reports independent with ADLs (assist with socks), but demonstrates limited ROM B shoulders/elbows during session which pt reports has been baseline since elbow surgery last year, also complains of tingling in fingers and difficulty with grip - dropping utensils when eating. Reports ~5 falls in past year. No family present to confirm PLOF. Gross motor control/coordination intact when assessed during session MMT B LEs 4-/5, except B hip flexion 3/5. Pt will benefit from continued skilled PT to increase their independence and maximize safety with mobility.      If plan is discharge home, recommend the following: Two people to help with walking and/or transfers;Two people to help with bathing/dressing/bathroom;Assistance with cooking/housework;Direct supervision/assist for medications  management;Direct supervision/assist for financial management;Assist for transportation;Help with stairs or ramp for entrance;Supervision due to cognitive status   Can travel by private vehicle     No  Equipment Recommendations  Rolling walker (2 wheels)    Recommendations for Other Services       Precautions / Restrictions Precautions Precautions: Fall Restrictions Weight Bearing Restrictions Per Provider Order: No     Mobility  Bed Mobility Overal bed mobility: Needs Assistance Bed Mobility: Supine to Sit, Sit to Supine     Supine to sit: Mod assist, +2 for physical assistance, +2 for safety/equipment, HOB elevated, Used rails Sit to supine: Max assist, +2 for physical assistance, +2 for safety/equipment   General bed mobility comments: Pt with good initation of B LEs toward EOB, increased effort noted. Ultimately required MOD A+2 for completion to EOB and use of chuck pad for scooting to edge in prep for sit to stand transfers. Requires encouragment for stand attempts.    Transfers Overall transfer level: Needs assistance Equipment used: Rolling walker (2 wheels) Transfers: Sit to/from Stand Sit to Stand: Mod assist, +2 physical assistance, +2 safety/equipment           General transfer comment: MOD A+2 for power up to stand, pt able to take multiple side shuffles toward R with manual assist for RW progression. Cues for forward gaze, upright posture in standing- does well with some encourgament as pt does demosntrate some self limiting behaviors due to fear/anxiousness with mobility.    Ambulation/Gait                   Stairs  Wheelchair Mobility     Tilt Bed    Modified Rankin (Stroke Patients Only)       Balance                                            Communication Communication Communication: No apparent difficulties  Cognition Arousal: Alert Behavior During Therapy: Anxious   PT - Cognitive  impairments: No apparent impairments                       PT - Cognition Comments: Pt oriented to self, place, and time. Not fully oriented to current medical situation. Able to get more info this date compared to eval date. Pt able answer questions during session, though questionable historian and no family present to confirm PLOF/home environment. Anxious with mobility. Following commands: Intact      Cueing Cueing Techniques: Verbal cues, Tactile cues  Exercises      General Comments General comments (skin integrity, edema, etc.): HOB 45deg at beginning of session: 136/72 (91), 98bpm. End of session 152/80 (102), 84bpm. 96%      Pertinent Vitals/Pain Pain Assessment Pain Assessment: No/denies pain    Home Living Family/patient expects to be discharged to:: Private residence Living Arrangements: Spouse/significant other   Type of Home: House Home Access: Stairs to enter Entrance Stairs-Rails: Can reach both Entrance Stairs-Number of Steps: 3 in front, 2 +1 side door   Home Layout: One level Home Equipment: Rollator (4 wheels);Cane - quad Additional Comments: husband can assist as needed at home, has other family that live nearby and reports they call and check in on daily.    Prior Function            PT Goals (current goals can now be found in the care plan section) Acute Rehab PT Goals Patient Stated Goal: Feel better and go home PT Goal Formulation: With patient Progress towards PT goals: Progressing toward goals    Frequency    Min 2X/week      PT Plan      Co-evaluation PT/OT/SLP Co-Evaluation/Treatment: Yes Reason for Co-Treatment: For patient/therapist safety;To address functional/ADL transfers PT goals addressed during session: Mobility/safety with mobility;Proper use of DME OT goals addressed during session: ADL's and self-care      AM-PAC PT 6 Clicks Mobility   Outcome Measure  Help needed turning from your back to your side while  in a flat bed without using bedrails?: A Lot Help needed moving from lying on your back to sitting on the side of a flat bed without using bedrails?: Total Help needed moving to and from a bed to a chair (including a wheelchair)?: Total Help needed standing up from a chair using your arms (e.g., wheelchair or bedside chair)?: Total Help needed to walk in hospital room?: Total Help needed climbing 3-5 steps with a railing? : Total 6 Click Score: 7    End of Session Equipment Utilized During Treatment: Gait belt Activity Tolerance: Patient tolerated treatment well Patient left: in bed;with call bell/phone within reach;with bed alarm set Nurse Communication: Mobility status PT Visit Diagnosis: Unsteadiness on feet (R26.81);Other abnormalities of gait and mobility (R26.89);Repeated falls (R29.6);Muscle weakness (generalized) (M62.81);Difficulty in walking, not elsewhere classified (R26.2)     Time: 8586-8550 PT Time Calculation (min) (ACUTE ONLY): 36 min  Charges:    $Therapeutic Activity: 23-37 mins PT General Charges $$  ACUTE PT VISIT: 1 Visit                     Tinnie BERRY PT, DPT  Acute Rehabilitation Services  Office [phone]  10/31/2024, 4:41 PM

## 2024-10-31 NOTE — Plan of Care (Signed)

## 2024-10-31 NOTE — TOC Progression Note (Addendum)
 Transition of Care [facility]) - Progression Note    Patient Details  Name: Bethany Wolfe MRN: [id number] Date of Birth: 1940/11/07  [facility] CM/SW Contact  Heather DELENA Saltness, LCSW Phone Number: 10/31/2024, 11:15 AM  Clinical Narrative:     ADDENDUM  CSW received secure chat from PT stating pt is now open to SNF rehab. CSW met with pt at bedside to discuss SNF rehab. Pt stated I'm not going to a nursing home. CSW explained process for short-term SNF rehab and referral process. Pt agreeable to CSW sending referrals. CSW completed FL2 and sent referrals to [facility] and surrounding areas. Currently pending bed offers.   CSW spoke with pt and spouse at bedside to discuss discharge planning and PT's recommendation for SNF rehab. Pt reports she is not going to a facility and prefers to discharge home with [facility] services. DME recommendation pending. Pt and spouse denied [facility] agency preference. Pt and spouse report pt's primary care provider was assisting with [facility] services prior to this hospitalization. Referral made to [facility] for [facility] PT/OT services. CSW spoke with Hollie at [facility] who confirmed ability to accept pt for services. TOC will continue to follow.   Expected Discharge Plan: Skilled Nursing Facility Barriers to Discharge: Continued Medical Work up    Expected Discharge Plan and Services In-house Referral: Clinical Social Work Discharge Planning Services: NA Post Acute Care Choice: NA Living arrangements for the past 2 months: Single Family Home                 DME Arranged: N/A DME Agency: NA       HH Arranged: NA HH Agency: NA         Social Drivers of Health (SDOH) Interventions SDOH Screenings   Food Insecurity: No Food Insecurity (10/29/2024)  Housing: Low Risk (10/29/2024)  Transportation Needs: No Transportation Needs (10/29/2024)  Utilities: Not At Risk (10/29/2024)  Alcohol Screen: Low Risk (03/22/2024)  Depression (PHQ2-9): Medium Risk (10/03/2024)   Financial Resource Strain: Low Risk (03/22/2024)  Physical Activity: Inactive (03/22/2024)  Social Connections: Moderately Isolated (10/29/2024)  Stress: No Stress Concern Present (03/22/2024)  Tobacco Use: Medium Risk (10/28/2024)  Health Literacy: Adequate Health Literacy (03/22/2024)    Readmission Risk Interventions    10/30/2024    2:12 PM  Readmission Risk Prevention Plan  Post Dischage Appt Complete  Medication Screening Complete  Transportation Screening Complete    Signed: Heather Saltness, MSW, LCSW Clinical Social Worker Inpatient Care Management 10/31/2024 11:18 AM

## 2024-10-31 NOTE — Plan of Care (Signed)
   Problem: Activity: Goal: Risk for activity intolerance will decrease Outcome: Progressing   Problem: Nutrition: Goal: Adequate nutrition will be maintained Outcome: Progressing   Problem: Coping: Goal: Level of anxiety will decrease Outcome: Progressing

## 2024-11-01 DIAGNOSIS — A419 Sepsis, unspecified organism: Secondary | ICD-10-CM | POA: Diagnosis not present

## 2024-11-01 DIAGNOSIS — L0390 Cellulitis, unspecified: Secondary | ICD-10-CM | POA: Diagnosis not present

## 2024-11-01 MED ORDER — ACETAMINOPHEN 325 MG PO TABS
650.0000 mg | ORAL_TABLET | Freq: Four times a day (QID) | ORAL | Status: DC | PRN
  Administered 2024-11-02 – 2024-11-05 (×3): 650 mg via ORAL
  Filled 2024-11-01 (×3): qty 2

## 2024-11-01 MED ORDER — CEPHALEXIN 500 MG PO CAPS
500.0000 mg | ORAL_CAPSULE | Freq: Three times a day (TID) | ORAL | Status: AC
  Administered 2024-11-01 – 2024-11-03 (×7): 500 mg via ORAL
  Filled 2024-11-01 (×7): qty 1

## 2024-11-01 NOTE — Progress Notes (Signed)
 Occupational Therapy Treatment Patient Details Name: Bethany Wolfe MRN: [id number] DOB: [birth year] Today's Date: 11/01/2024   History of present illness 84 yr old female who presented to the ED secondary to 5 falls in the past 3 weeks, one of which on date of admission 10/28/2024. Pt reportedly fell backward and struck her head, unclear of LOC and pt is not currently reporting vision changes. Pt reported B hip pain, R  elbow, chest and low back pain. Family endorses progressive confusion over the past 3 wks and well as decline with UE motor control and coordination. Pt found to have LE wounds and hematoma and diagnosis of sepsis secondary to R LE cellulitis as well as encephalopathy. PMH includes but is not limited to: HTN, HLD, GERD, asthma, L humeral fx s/p ORIF (2024), lumbar surgery and R ankle surgery.   OT comments  Pt was seen for functional strengthening, ADL instruction and progression of participation, and functional transfers needed to facilitate progressive ADL performance. She required decreased assist for supine to sit today, as compared to the therapy session yesterday. Once seated EOB, she demonstrated fair sitting balance, while needing min assist for upper body dressing and min assist for hair combing. She required mod assist to perform 2 sit to stand transfers using a RW, with minimum overall standing tolerances of 30-45 seconds each stand. After the second stand, she was able to take lateral steps towards the head of the bed. She presented with improved overall activity tolerance today. Her spouse was present during the session and was encouraging throughout. The pt is motivated to get stronger & to eventually return home & back to her normal activities. OT will continue to follow her services in the acute care setting. Patient will benefit from intensive inpatient follow-up therapy, >3 hours/day.      If plan is discharge home, recommend the following:  A lot of help with walking  and/or transfers;A lot of help with bathing/dressing/bathroom;Help with stairs or ramp for entrance;Assist for transportation;Assistance with cooking/housework   Equipment Recommendations  BSC/3in1    Recommendations for Other Services      Precautions / Restrictions Precautions Precautions: Fall Restrictions Weight Bearing Restrictions Per Provider Order: No       Mobility Bed Mobility Overal bed mobility: Needs Assistance Bed Mobility: Supine to Sit, Sit to Supine     Supine to sit: Mod assist, Used rails, HOB elevated Sit to supine: Min assist        Transfers Overall transfer level: Needs assistance Equipment used: Rolling walker (2 wheels) Transfers: Sit to/from Stand Sit to Stand: Mod assist, +2 safety/equipment           General transfer comment: Pt performed 2 stands from EOB, requiring a seated rest break between stands. She demonstrated a standing tolerance of 30 seconds the first time, then ~45 seconds the 2nd stand. She needed mod verbal cues for trunk extension and proper hand placement on walker.     Balance     Sitting balance-Leahy Scale: Fair       Standing balance-Leahy Scale: Poor               ADL either performed or assessed with clinical judgement   ADL Overall ADL's : Needs assistance/impaired     Grooming: Minimal assistance;Sitting Grooming Details (indicate cue type and reason): Pt performed hair combing seated EOB. She required assist to assess/comb the posterior aspect of her scalp, given proximal RUE AROM limitations.  Upper Body Dressing : Minimal assistance;Sitting Upper Body Dressing Details (indicate cue type and reason): Pt required assist to doff a hospital gown and to donn another clean one seated EOB; assist mostly needed for pt to pull sleeves of gown up over her shoulders. Lower Body Dressing: Maximal assistance;Sitting/lateral leans Lower Body Dressing Details (indicate cue type and reason): she  required significant assist to donn socks seated EOB                     Communication Communication Communication: No apparent difficulties   Cognition Arousal: Alert Behavior During Therapy: WFL for tasks assessed/performed               OT - Cognition Comments: Oriented x4            Following commands: Intact Following commands impaired: Follows one step commands with increased time      Cueing   Cueing Techniques: Verbal cues, Tactile cues             Pertinent Vitals/ Pain       Pain Assessment Pain Assessment: No/denies pain   Frequency  Min 2X/week        Progress Toward Goals  OT Goals(current goals can now be found in the care plan section)     Acute Rehab OT Goals Patient Stated Goal: to get stronger and to go to inpatient rehab OT Goal Formulation: With patient/family Time For Goal Achievement: 11/14/24 Potential to Achieve Goals: Good  Plan         AM-PAC OT 6 Clicks Daily Activity     Outcome Measure   Help from another person eating meals?: A Little Help from another person taking care of personal grooming?: A Little Help from another person toileting, which includes using toliet, bedpan, or urinal?: A Lot Help from another person bathing (including washing, rinsing, drying)?: A Lot Help from another person to put on and taking off regular upper body clothing?: A Little Help from another person to put on and taking off regular lower body clothing?: A Lot 6 Click Score: 15    End of Session Equipment Utilized During Treatment: Gait belt;Rolling walker (2 wheels)  OT Visit Diagnosis: Unsteadiness on feet (R26.81);Other abnormalities of gait and mobility (R26.89);Muscle weakness (generalized) (M62.81);History of falling (Z91.81)   Activity Tolerance Patient tolerated treatment well   Patient Left in bed;with call bell/phone within reach;with bed alarm set   Nurse Communication Mobility status        Time:  8660-8579 OT Time Calculation (min): 41 min  Charges: OT General Charges $OT Visit: 1 Visit OT Treatments $Self Care/Home Management : 8-22 mins $Therapeutic Activity: 23-37 mins    Delanna JINNY Lesches, OTR/L 11/01/2024, 5:33 PM

## 2024-11-01 NOTE — Progress Notes (Signed)
 Physical Therapy Treatment Patient Details Name: Bethany Wolfe MRN: [id number] DOB: 1941/10/08 Today's Date: 11/01/2024   History of Present Illness 84 yo female presents to therapy from ED secondary to 5 falls in the past 3 wks one of which on date of admission 10/28/2024. Pt reportedly fell backward and struck her head, unclear of LOC and pt is not currently reporting vision changes or N and V. Pt c/o B hip pain, R  elbow, chest and LBP. Family endorses progressive confusion over the past 3 wks and well as decline with UE motor control and coordination. Pt found to have LE wounds and hematoma and dx with sepsis secondary to R LE cellulitis as well as encephalopathy. Pt PMH includes but is not limited to: HTN, HLD, GERD, asthma, L humeral fx s/p ORIF (2024), lumbar surgery and R ankle surgery.    PT Comments  Pt is progressing toward acute PT goals this session with progression to ambulation and decreased assist for mobility. Pt performed bed mobility with MIN-MOD A. Sit to stand transfers with MOD A+2 and ambulated ~55ft x2 with MIN A+2 and close chair follow provided. Pt motivated for mobility and progression this date and demonstrates good progression with mobility. Pt will benefit from continued skilled PT to increase their independence and maximize safety with mobility. Updated recommendation - pt will benefit from continued inpatient therapy >3hrs/day upon d/c to improve mobility, independence and return toward PLOF.      If plan is discharge home, recommend the following: Two people to help with walking and/or transfers;Two people to help with bathing/dressing/bathroom;Assistance with cooking/housework;Direct supervision/assist for medications management;Direct supervision/assist for financial management;Assist for transportation;Help with stairs or ramp for entrance;Supervision due to cognitive status   Can travel by private vehicle     No  Equipment Recommendations  Rolling walker (2  wheels)    Recommendations for Other Services       Precautions / Restrictions Precautions Precautions: Fall Restrictions Weight Bearing Restrictions Per Provider Order: No     Mobility  Bed Mobility Overal bed mobility: Needs Assistance Bed Mobility: Supine to Sit, Sit to Supine     Supine to sit: Mod assist, Used rails, HOB elevated Sit to supine: Min assist, HOB elevated, Used rails   General bed mobility comments: Pt with good initation of B LEs toward EOB, increased time. use of bed rails and up to MOD A for trunk to upright. Able to scoot towards edge of bed with increased time and close supervision this date.  MIN A for LEs back to bed and for trunk control back to supine.    Transfers Overall transfer level: Needs assistance Equipment used: Rolling walker (2 wheels) Transfers: Sit to/from Stand Sit to Stand: Mod assist, +2 physical assistance, +2 safety/equipment           General transfer comment: sit to stand x2 from elevated bed height and from low surface recliner. Cues for hand placement- increased time for problem solving with hand placement and stand attempts- pt with tendency to pull up on RW with B UEs on RW- does recall education on proper hand placement from EOB from previous sessions with verbal cues.    Ambulation/Gait Ambulation/Gait assistance: Min assist, +2 physical assistance, +2 safety/equipment Gait Distance (Feet): 15 Feet Assistive device: Rolling walker (2 wheels) Gait Pattern/deviations: Step-to pattern, Decreased stride length, Narrow base of support, Decreased step length - left, Decreased step length - right Gait velocity: decreased     General Gait Details: pt ambulated  5ft x2 with seated rest break between and close chair follow (provided by 3rd person). Seated rest break due to UE muscle fatigue. No overt LOB. Cues for upright posture, increased step height for reduction of fall risk, forward gaze. x1 instance of bumping into door  when leaving from room, increased time for navigating.   Stairs             Wheelchair Mobility     Tilt Bed    Modified Rankin (Stroke Patients Only)       Balance Overall balance assessment: History of Falls, Needs assistance Sitting-balance support: Feet supported, No upper extremity supported Sitting balance-Leahy Scale: Fair     Standing balance support: Bilateral upper extremity supported, During functional activity, Reliant on assistive device for balance Standing balance-Leahy Scale: Poor                              Communication Communication Communication: No apparent difficulties  Cognition Arousal: Alert Behavior During Therapy: WFL for tasks assessed/performed   PT - Cognitive impairments: No apparent impairments                         Following commands: Intact      Cueing Cueing Techniques: Verbal cues, Tactile cues  Exercises      General Comments        Pertinent Vitals/Pain Pain Assessment Pain Assessment: No/denies pain    Home Living                          Prior Function            PT Goals (current goals can now be found in the care plan section) Acute Rehab PT Goals Patient Stated Goal: Feel better and go home PT Goal Formulation: With patient Progress towards PT goals: Progressing toward goals    Frequency    Min 3X/week      PT Plan      Co-evaluation              AM-PAC PT 6 Clicks Mobility   Outcome Measure  Help needed turning from your back to your side while in a flat bed without using bedrails?: A Lot Help needed moving from lying on your back to sitting on the side of a flat bed without using bedrails?: A Lot Help needed moving to and from a bed to a chair (including a wheelchair)?: Total Help needed standing up from a chair using your arms (e.g., wheelchair or bedside chair)?: Total Help needed to walk in hospital room?: Total Help needed climbing 3-5  steps with a railing? : Total 6 Click Score: 8    End of Session Equipment Utilized During Treatment: Gait belt Activity Tolerance: Patient tolerated treatment well Patient left: in bed;with call bell/phone within reach;with bed alarm set Nurse Communication: Mobility status PT Visit Diagnosis: Unsteadiness on feet (R26.81);Other abnormalities of gait and mobility (R26.89);Repeated falls (R29.6);Muscle weakness (generalized) (M62.81);Difficulty in walking, not elsewhere classified (R26.2)     Time: 8461-8386 PT Time Calculation (min) (ACUTE ONLY): 35 min  Charges:    $Therapeutic Activity: 23-37 mins PT General Charges $$ ACUTE PT VISIT: 1 Visit                     Tinnie BERRY PT, DPT  Acute Rehabilitation Services  Office [phone]  11/01/2024, 4:39 PM

## 2024-11-01 NOTE — TOC Progression Note (Signed)
 Transition of Care [facility]) - Progression Note    Patient Details  Name: Bethany Wolfe MRN: [id number] Date of Birth: 1941-01-26  Transition of Care [facility]) CM/SW Contact  Heather DELENA Saltness, LCSW Phone Number: 11/01/2024, 3:58 PM  Clinical Narrative:     ADDENDUM  PT now recommending CIR. TOC will continue to follow.  CSW received secure chat from RN stating pt and spouse are inquiring why pt can't go to CIR. CSW spoke with pt and spouse yesterday to explain criteria for SNF versus CIR. RN and OT also explained to pt and spouse explained difference between CIR and SNF. Pt currently recommended for SNF, referrals sent out yesterday. Pt and spouse stating today pt is not wanting to go to a nursing home. PT to re-evaluate.    Expected Discharge Plan: Skilled Nursing Facility Barriers to Discharge: Continued Medical Work up  Expected Discharge Plan and Services In-house Referral: Clinical Social Work Discharge Planning Services: NA Post Acute Care Choice: NA Living arrangements for the past 2 months: Single Family Home                 DME Arranged: N/A DME Agency: NA       HH Arranged: NA HH Agency: NA         Social Drivers of Health (SDOH) Interventions SDOH Screenings   Food Insecurity: No Food Insecurity (10/29/2024)  Housing: Low Risk (10/29/2024)  Transportation Needs: No Transportation Needs (10/29/2024)  Utilities: Not At Risk (10/29/2024)  Alcohol Screen: Low Risk (03/22/2024)  Depression (PHQ2-9): Medium Risk (10/03/2024)  Financial Resource Strain: Low Risk (03/22/2024)  Physical Activity: Inactive (03/22/2024)  Social Connections: Moderately Isolated (10/29/2024)  Stress: No Stress Concern Present (03/22/2024)  Tobacco Use: Medium Risk (10/28/2024)  Health Literacy: Adequate Health Literacy (03/22/2024)    Readmission Risk Interventions    10/30/2024    2:12 PM  Readmission Risk Prevention Plan  Post Dischage Appt Complete  Medication Screening Complete   Transportation Screening Complete    Signed: Heather Saltness, MSW, LCSW Clinical Social Worker Inpatient Care Management 11/01/2024 4:27 PM

## 2024-11-01 NOTE — Progress Notes (Signed)
 " PROGRESS NOTE    Bethany Wolfe  FMW:[id number] DOB: Aug 30, 1941 DOA: 10/28/2024 PCP: Iven Lang DASEN, PA-C    Brief Narrative:  84 year old with multiple falls at home for last 3 weeks, progressive confusion brought to the ER by family.  In the emergency room initially patient reported some back pain and losing her balance.  Family reported visual hallucinations. In the emergency room WBC count 12K.  Electrolytes adequate.  Found to have leg infection.  Patient underwent extensive skeletal survey including CT scans of the head and cervical spine followed by MRI of the brain and cervical spine X-rays of the both arms, legs, hips as well CT angiogram of the chest with no acute findings.  Admitted to treat for cellulitis and altered mentation.  Subjective:  Patient seen and examined.  Today she is awake and interactive.  No overnight events.  Denies any pain.  She agreed to go to a skilled nursing facility for wound care and physical therapy.  No family at the bedside.   Assessment & Plan:   Severe sepsis, meets criteria with tachycardia and leukocytosis.  Encephalopathy.  Possible source of infection right lower extremity cellulitis-open wound with history of hematoma. Blood cultures drawn, negative so far. Currently does not need any surgical intervention. Treated with Ancef  for 48 hours.  Allergy to penicillin.  Will treat with 2 weeks of oral Keflex  therapy. Aggressive wound care. Patient will need ongoing follow-up at wound care clinic.  Acute encephalopathy likely metabolic. Not on any medication causing drowsiness or encephalopathy. MRI brain and MRI cervical spine, incompletely studies however no obvious abnormalities. TSH, ammonia, UDS negative. B12, adequate Focus on mobility.  Avoid benzodiazepines. Avoid benzos.  Hypertension: Patient on multiple blood pressure medications.  Her blood pressures are normal.  Holding indapamide  and losartan .  Medically stable to  transition to SNF.     DVT prophylaxis: enoxaparin  (LOVENOX ) injection 40 mg Start: 10/29/24 1000   Code Status: Full code Family Communication: None at the bedside.   Disposition Plan: Status is: Inpatient Remains inpatient appropriate because: Medically stable.  Transition to SNF when bed available.     Consultants:  Wound care  Procedures:  None  Antimicrobials:  Ancef  1/5---     Objective: Vitals:   10/31/24 0450 10/31/24 1523 10/31/24 2048 11/01/24 0447  BP: 124/74 123/66 135/65 127/71  Pulse: 78 75 74 73  Resp: 18 18 18 18   Temp: 97.9 F (36.6 C) 98.5 F (36.9 C) 98.4 F (36.9 C) 98.8 F (37.1 C)  TempSrc: Oral Oral  Oral  SpO2: 92% 96% 93% 93%  Weight:      Height:        Intake/Output Summary (Last 24 hours) at 11/01/2024 0958 Last data filed at 11/01/2024 0448 Gross per 24 hour  Intake 240 ml  Output 400 ml  Net -160 ml   Filed Weights   10/29/24 0302 10/29/24 1213  Weight: 107.5 kg 106.6 kg    Examination:  General exam: Chronically sick looking.  Frail.  Alert awake and interactive today.  Oriented x 4.  Grossly weak. Respiratory system: Clear to auscultation. Respiratory effort normal.  No added sounds. Cardiovascular system: S1 & S2 heard, RRR.  Gastrointestinal system: Abdomen is nondistended, soft and nontender. No organomegaly or masses felt. Normal bowel sounds heard. Central nervous system: Alert and awake.  He is mostly oriented.  Gross generalized weakness. Skin:  Patient has some tender swelling of the right elbow without open wound. Right leg with open  ulceration with some surrounding cellulitis. Stage III pressure ulcer bilateral buttocks as in the picture. Picture from 1/7        Data Reviewed: I have personally reviewed following labs and imaging studies  CBC: Recent Labs  Lab 10/28/24 2240 10/30/24 0545  WBC 12.8* 7.4  HGB 12.6 10.5*  HCT 39.6 34.1*  MCV 83.0 84.8  PLT 373 250   Basic Metabolic Panel: Recent  Labs  Lab 10/28/24 2240 10/30/24 0545  NA 140 140  K 3.9 3.6  CL 102 105  CO2 29 27  GLUCOSE 133* 110*  BUN 22 13  CREATININE 1.17* 0.74  CALCIUM 10.3 9.3   GFR: Estimated Creatinine Clearance: 64.6 mL/min (by C-G formula based on SCr of 0.74 mg/dL). Liver Function Tests: Recent Labs  Lab 10/29/24 0148  AST 30  ALT 14  ALKPHOS 94  BILITOT 0.4  PROT 6.7  ALBUMIN 3.6   No results for input(s): LIPASE, AMYLASE in the last 168 hours. Recent Labs  Lab 10/29/24 0733  AMMONIA 50*   Coagulation Profile: No results for input(s): INR, PROTIME in the last 168 hours. Cardiac Enzymes: No results for input(s): CKTOTAL, CKMB, CKMBINDEX, TROPONINI in the last 168 hours. BNP (last 3 results) Recent Labs    10/29/24 0148  PROBNP 191.0   HbA1C: No results for input(s): HGBA1C in the last 72 hours. CBG: No results for input(s): GLUCAP in the last 168 hours. Lipid Profile: No results for input(s): CHOL, HDL, LDLCALC, TRIG, CHOLHDL, LDLDIRECT in the last 72 hours. Thyroid  Function Tests: No results for input(s): TSH, T4TOTAL, FREET4, T3FREE, THYROIDAB in the last 72 hours.  Anemia Panel: Recent Labs    10/30/24 0545  VITAMINB12 273   Sepsis Labs: Recent Labs  Lab 10/29/24 0835  LATICACIDVEN 1.1    Recent Results (from the past 240 hours)  Culture, blood (Routine X 2) w Reflex to ID Panel     Status: None (Preliminary result)   Collection Time: 10/29/24  8:35 AM   Specimen: BLOOD  Result Value Ref Range Status   Specimen Description   Final    BLOOD LEFT ANTECUBITAL Performed at [facility], [address], [place], [place] [address]    Special Requests   Final    BOTTLES DRAWN AEROBIC AND ANAEROBIC Blood Culture results may not be optimal due to an inadequate volume of blood received in culture bottles Performed at [facility], [address] [place], [place] [address]    Culture    Final    NO GROWTH 3 DAYS Performed at [facility], [address], [place], [place] [address]    Report Status PENDING  Incomplete  Culture, blood (Routine X 2) w Reflex to ID Panel     Status: None (Preliminary result)   Collection Time: 10/29/24  8:40 AM   Specimen: BLOOD LEFT HAND  Result Value Ref Range Status   Specimen Description   Final    BLOOD LEFT HAND Performed at [facility], [address], [place], [place] [address]    Special Requests   Final    BOTTLES DRAWN AEROBIC AND ANAEROBIC Blood Culture adequate volume Performed at [facility], [address] [place], [place] [address]    Culture   Final    NO GROWTH 3 DAYS Performed at [facility], [address], [place], [place] [address]    Report Status PENDING  Incomplete  Radiology Studies: No results found.       Scheduled Meds:  aspirin   81 mg Oral BID   cephALEXin   500 mg Oral Q8H   cholecalciferol   2,000 Units Oral Daily   collagenase    Topical Daily   enoxaparin  (LOVENOX ) injection  40 mg Subcutaneous Q24H   hydrocerin   Topical Daily   pantoprazole   40 mg Oral Daily   Continuous Infusions:     LOS: 3 days      Renato Applebaum, MD [facility]   "

## 2024-11-01 NOTE — Plan of Care (Signed)

## 2024-11-02 DIAGNOSIS — A419 Sepsis, unspecified organism: Secondary | ICD-10-CM | POA: Diagnosis not present

## 2024-11-02 DIAGNOSIS — L0390 Cellulitis, unspecified: Secondary | ICD-10-CM | POA: Diagnosis not present

## 2024-11-02 MED ORDER — LOSARTAN POTASSIUM 50 MG PO TABS
50.0000 mg | ORAL_TABLET | Freq: Every day | ORAL | Status: DC
  Administered 2024-11-02 – 2024-11-07 (×6): 50 mg via ORAL
  Filled 2024-11-02 (×6): qty 1

## 2024-11-02 MED ORDER — INDAPAMIDE 1.25 MG PO TABS: 1.2500 mg | ORAL_TABLET | Freq: Every day | ORAL | Status: DC

## 2024-11-02 MED ORDER — MUSCLE RUB 10-15 % EX CREA
TOPICAL_CREAM | CUTANEOUS | Status: DC | PRN
  Filled 2024-11-02: qty 85

## 2024-11-02 NOTE — Progress Notes (Signed)
 Inpatient Rehab Admissions Coordinator Note:   Per updated PT/OT recommendations patient was screened for CIR candidacy by Reche FORBES Lowers, PT. At this time, pt appears to be a potential candidate for CIR. I will place an order for rehab consult for full assessment, per our protocol.  Please contact me any with questions.SABRA Reche Lowers, PT, DPT [phone] 11/02/2024 10:22 AM

## 2024-11-02 NOTE — Plan of Care (Signed)

## 2024-11-02 NOTE — Progress Notes (Signed)
 " PROGRESS NOTE    Bethany Wolfe  FMW:[id number] DOB: 07-Nov-1940 DOA: 10/28/2024 PCP: Iven Lang DASEN, PA-C    Brief Narrative:  84 year old with multiple falls at home for last 3 weeks, progressive confusion brought to the ER by family.  In the emergency room initially patient reported some back pain and losing her balance.  Family reported visual hallucinations. In the emergency room WBC count 12K.  Electrolytes adequate.  Found to have leg infection.  Patient underwent extensive skeletal survey including CT scans of the head and cervical spine followed by MRI of the brain and cervical spine X-rays of the both arms, legs, hips as well CT angiogram of the chest with no acute findings.  Admitted to treat for cellulitis and altered mentation.  Subjective:  Patient seen and examined.  Today denies any complaints.  Patient was seated in the chair.  She was alert awake oriented.  Pleasant interaction.  She told me that she would go to rehab or subacute rehab wherever her insurance will approve.  No other overnight events.   Assessment & Plan:   Severe sepsis, met criteria with tachycardia and leukocytosis.  Encephalopathy.  Possible source of infection right lower extremity cellulitis-open wound with history of hematoma. Blood cultures drawn, negative so far. Currently does not need any surgical intervention. Treated with Ancef  for 48 hours.  Allergy to penicillin.  Will treat with 2 weeks of oral Keflex  therapy. Aggressive wound care. Patient will need ongoing follow-up at wound care clinic.  Acute encephalopathy likely metabolic.  Mental status improved and normalized. Not on any medication causing drowsiness or encephalopathy. MRI brain and MRI cervical spine, incompletely studies however no obvious abnormalities. TSH, ammonia, UDS negative. B12, adequate Focus on mobility.  Avoid benzodiazepines.  Hypertension: Patient on multiple blood pressure medications.  Her blood pressures  are elevated now.  Resume home medications.   Medically stable to transition to SNF or CIR as recommended by therapies.     DVT prophylaxis: enoxaparin  (LOVENOX ) injection 40 mg Start: 10/29/24 1000   Code Status: Full code Family Communication: None at the bedside.   Disposition Plan: Status is: Inpatient Remains inpatient appropriate because: Medically stable.  Waiting for rehab.     Consultants:  Wound care  Procedures:  None  Antimicrobials:  Ancef  1/5--- 1/8 Keflex  1/8--     Objective: Vitals:   11/01/24 0447 11/01/24 1423 11/01/24 2001 11/02/24 0614  BP: 127/71 (!) 140/71 122/70 (!) 149/69  Pulse: 73 83 69 80  Resp: 18 18 (!) 22 20  Temp: 98.8 F (37.1 C) 98.7 F (37.1 C) 98.8 F (37.1 C) 98.6 F (37 C)  TempSrc: Oral Oral    SpO2: 93% 97% 94% 95%  Weight:      Height:        Intake/Output Summary (Last 24 hours) at 11/02/2024 1126 Last data filed at 11/02/2024 1120 Gross per 24 hour  Intake 480 ml  Output 1650 ml  Net -1170 ml   Filed Weights   10/29/24 0302 10/29/24 1213  Weight: 107.5 kg 106.6 kg    Examination:  General exam:  Alert awake and interactive today.  Oriented x 4.  Grossly weak. Respiratory system: Clear to auscultation. Respiratory effort normal.  No added sounds. Cardiovascular system: S1 & S2 heard, RRR.  Gastrointestinal system: Abdomen is nondistended, soft and nontender. No organomegaly or masses felt. Normal bowel sounds heard. Central nervous system: Alert and awake.  He is mostly oriented.  Gross generalized weakness. Skin:  Right leg with open ulceration with some surrounding redness . No evidence of abscess.  Stage III pressure ulcer bilateral buttocks as in the picture. Picture from 1/7        Data Reviewed: I have personally reviewed following labs and imaging studies  CBC: Recent Labs  Lab 10/28/24 2240 10/30/24 0545  WBC 12.8* 7.4  HGB 12.6 10.5*  HCT 39.6 34.1*  MCV 83.0 84.8  PLT 373 250    Basic Metabolic Panel: Recent Labs  Lab 10/28/24 2240 10/30/24 0545  NA 140 140  K 3.9 3.6  CL 102 105  CO2 29 27  GLUCOSE 133* 110*  BUN 22 13  CREATININE 1.17* 0.74  CALCIUM 10.3 9.3   GFR: Estimated Creatinine Clearance: 64.6 mL/min (by C-G formula based on SCr of 0.74 mg/dL). Liver Function Tests: Recent Labs  Lab 10/29/24 0148  AST 30  ALT 14  ALKPHOS 94  BILITOT 0.4  PROT 6.7  ALBUMIN 3.6   No results for input(s): LIPASE, AMYLASE in the last 168 hours. Recent Labs  Lab 10/29/24 0733  AMMONIA 50*   Coagulation Profile: No results for input(s): INR, PROTIME in the last 168 hours. Cardiac Enzymes: No results for input(s): CKTOTAL, CKMB, CKMBINDEX, TROPONINI in the last 168 hours. BNP (last 3 results) Recent Labs    10/29/24 0148  PROBNP 191.0   HbA1C: No results for input(s): HGBA1C in the last 72 hours. CBG: No results for input(s): GLUCAP in the last 168 hours. Lipid Profile: No results for input(s): CHOL, HDL, LDLCALC, TRIG, CHOLHDL, LDLDIRECT in the last 72 hours. Thyroid  Function Tests: No results for input(s): TSH, T4TOTAL, FREET4, T3FREE, THYROIDAB in the last 72 hours.  Anemia Panel: No results for input(s): VITAMINB12, FOLATE, FERRITIN, TIBC, IRON, RETICCTPCT in the last 72 hours.  Sepsis Labs: Recent Labs  Lab 10/29/24 0835  LATICACIDVEN 1.1    Recent Results (from the past 240 hours)  Culture, blood (Routine X 2) w Reflex to ID Panel     Status: None (Preliminary result)   Collection Time: 10/29/24  8:35 AM   Specimen: BLOOD  Result Value Ref Range Status   Specimen Description   Final    BLOOD LEFT ANTECUBITAL Performed at [facility], [address] [place], [place] [address]    Special Requests   Final    BOTTLES DRAWN AEROBIC AND ANAEROBIC Blood Culture results may not be optimal due to an inadequate volume of blood received in culture  bottles Performed at [facility], [address] [place], [place] [address]    Culture   Final    NO GROWTH 4 DAYS Performed at [facility], [address] [place], [place] [address]    Report Status PENDING  Incomplete  Culture, blood (Routine X 2) w Reflex to ID Panel     Status: None (Preliminary result)   Collection Time: 10/29/24  8:40 AM   Specimen: BLOOD LEFT HAND  Result Value Ref Range Status   Specimen Description   Final    BLOOD LEFT HAND Performed at [facility], [address] [place], [place] [address]    Special Requests   Final    BOTTLES DRAWN AEROBIC AND ANAEROBIC Blood Culture adequate volume Performed at [facility], [address] [place], [place] [address]    Culture   Final    NO GROWTH 4 DAYS Performed at [facility], [address] [place], [place]  [address]    Report Status PENDING  Incomplete         Radiology Studies: No results found.       Scheduled Meds:  aspirin   81 mg Oral BID   cephALEXin   500 mg Oral Q8H   cholecalciferol   2,000 Units Oral Daily   collagenase    Topical Daily   enoxaparin  (LOVENOX ) injection  40 mg Subcutaneous Q24H   hydrocerin   Topical Daily   indapamide   1.25 mg Oral Daily   losartan   50 mg Oral Daily   pantoprazole   40 mg Oral Daily   Continuous Infusions:     LOS: 4 days      Renato Applebaum, MD [facility]   "

## 2024-11-02 NOTE — PMR Pre-admission (Shared)
 PMR Admission Coordinator Pre-Admission Assessment  Patient: Bethany Wolfe is an 84 y.o., female MRN: [id number] DOB: 1940/12/28 Height: 5' 5 (165.1 cm) Weight: 106.6 kg  Insurance Information HMO: PPO: yes      PCP:      IPA:      80/20:      OTHER:  PRIMARY: [employer] PPO      Policy#: [id number],  Medicare: [id number]   Subscriber: Pt CM Name: ***      Phone#: ***     Fax#: [phone] Pre-Cert#: ***      Employer:  Benefits:  Phone #: [phone]-option 1, spoke with Bethany Wolfe Date: 10/25/2024 - 10/24/2025 Deductible: no deductible  OOP Max: $3,900 ($0 met) CIR: $325/day co-pay for days 1-6, $0/day days 7-90 SNF:  $0/day co-pay for days 1-20, $218/day co-pay for days 21-100; limited to 100 days/benefit period Outpatient: $25/visit co-pay; limited by medical necessity Home Health:  100% coverage DME: 75% coverage; 25% co-insurance Providers: in network   SECONDARY:       Policy#:      Phone#:   Artist:       Phone#:   [employer] for patients in Inpatient Rehabilitation Facilities with attached Privacy Act Statement-Health Care Records was provided and verbally reviewed with: {CHL IP Patient Family WJ:[id number]}  Emergency Contact Information Contact Information     Name Relation Home Work Mobile   Bethany Wolfe Spouse [phone]  [phone]      Other Contacts   None on File     Current Medical History  Patient Admitting Diagnosis: Debility History of Present Illness: Bethany Wolfe is a 84 y.o. female with medical history significant for osteoarthritis who was brought to the [facility] emergency department on 11/02/24 by her husband and grandson who stated that she has had progressive confusion over the last 3 weeks, and has had multiple falls at home.  CT scans of the head and cervical spine followed by MRI of the brain and cervical spine.X-rays of the both arms, legs, hips as well CT  angiogram of the chest with no acute findings  Workup revealed a white count of 12,000. She was was found to have a cellulitis of her right lower extremity and subsequent sepsis with encephalopathy. Admitted to treat for cellulitis and altered mentation. Patient was treated with IV Ancef  with transition to oral Keflex . Imaging of the brain spine etc. was negative. Blood pressure has been elevated. Pt. Seen by PT/OT/SLP and they recommend CIR to assist return to PLOF.      Patient's medical record from [facility]  has been reviewed by the rehabilitation admission coordinator and physician.  Past Medical History  Past Medical History:  Diagnosis Date   Asthma    Humerus fracture 06/2023   left    Has the patient had major surgery during 100 days prior to admission? No  Family History   family history includes Lung cancer in her father; Lymphoma in her mother.  Current Medications Current Medications[1]  Patients Current Diet:  Diet Order             Diet regular Room service appropriate? Yes; Fluid consistency: Thin  Diet effective now                   Precautions / Restrictions Precautions Precautions: Fall Restrictions Weight Bearing Restrictions Per Provider Order: No   Has the patient had 2 or more falls or a fall with injury  in the past year? Yes  Prior Activity Level Community (5-7x/wk): Pt. active in the community to PTA  Prior Functional Level Self Care: Did the patient need help bathing, dressing, using the toilet or eating? Needed some help  Indoor Mobility: Did the patient need assistance with walking from room to room (with or without device)? Needed some help  Stairs: Did the patient need assistance with internal or external stairs (with or without device)? Needed some help  Functional Cognition: Did the patient need help planning regular tasks such as shopping or remembering to take medications? Needed some help  Patient Information Are  you of Hispanic, Latino/a,or Spanish origin?: A. No, not of Hispanic, Latino/a, or Spanish origin What is your race?: A. White Do you need or want an interpreter to communicate with a doctor or health care staff?: 0. No  Patient's Response To:  Health Literacy and Transportation Is the patient able to respond to health literacy and transportation needs?: Yes Health Literacy - How often do you need to have someone help you when you read instructions, pamphlets, or other written material from your doctor or pharmacy?: Never In the past 12 months, has lack of transportation kept you from medical appointments or from getting medications?: No In the past 12 months, has lack of transportation kept you from meetings, work, or from getting things needed for daily living?: No  Home Assistive Devices / Equipment Home Equipment: Rollator (4 wheels), Cane - single point  Prior Device Use: Indicate devices/aids used by the patient prior to current illness, exacerbation or injury? None of the above  Current Functional Level Cognition  Orientation Level: Oriented to person, Oriented to place, Oriented to time    Extremity Assessment (includes Sensation/Coordination)  Upper Extremity Assessment: Right hand dominant, LUE deficits/detail, RUE deficits/detail RUE Deficits / Details: reports difficuty with  grip- holding utensils (reports difficuty with grip- holding utensils, chronic shoulder AROM limitations, elbow and hand AROM WFL. Grip strength 4-/5, mildly impaired finger to nose and digit opposition) LUE Deficits / Details: chronic shoulder AROM limitations, elbow and hand AROM WFL. Grip strength 4/5  Lower Extremity Assessment: Generalized weakness    ADLs  Overall ADL's : Needs assistance/impaired Eating/Feeding: Set up, Sitting Grooming: Minimal assistance, Sitting Grooming Details (indicate cue type and reason): Pt performed hair combing seated EOB. She required assist to assess/comb the  posterior aspect of her scalp, given proximal RUE AROM limitations. Upper Body Bathing: Contact guard assist, Sitting Lower Body Bathing: Maximal assistance, Sitting/lateral leans Upper Body Dressing : Minimal assistance, Sitting Upper Body Dressing Details (indicate cue type and reason): Pt required assist to doff a hospital gown and to donn another clean one seated EOB; assist mostly needed for pt to pull sleeves of gown up over her shoulders. Lower Body Dressing: Maximal assistance, Sitting/lateral leans Lower Body Dressing Details (indicate cue type and reason): she required significant assist to donn socks seated EOB Toilet Transfer: Moderate assistance, +2 for physical assistance, Rolling walker (2 wheels), BSC/3in1, Cueing for sequencing, Stand-pivot Toileting- Clothing Manipulation and Hygiene: Maximal assistance, Sit to/from stand, Cueing for compensatory techniques, Cueing for sequencing Toileting - Clothing Manipulation Details (indicate cue type and reason): at bedside commode level, based on clinical judgement    Mobility  Overal bed mobility: Needs Assistance Bed Mobility: Supine to Sit, Sit to Supine Supine to sit: Mod assist, Used rails, HOB elevated Sit to supine: Min assist General bed mobility comments: Pt with good initation of B LEs toward EOB, increased time. use  of bed rails and up to MOD A for trunk to upright. Able to scoot towards edge of bed with increased time and close supervision this date.  MIN A for LEs back to bed and for trunk control back to supine.    Transfers  Overall transfer level: Needs assistance Equipment used: Rolling walker (2 wheels) Transfers: Sit to/from Stand Sit to Stand: Mod assist, +2 safety/equipment General transfer comment: Pt performed 2 stands from EOB, requiring a seated rest break between stands. She demonstrated a standing tolerance of 30 seconds the first time, then ~45 seconds the 2nd stand. She needed mod verbal cues for trunk  extension and proper hand placement on walker.    Ambulation / Gait / Stairs / Wheelchair Mobility  Ambulation/Gait Ambulation/Gait assistance: Min assist, +2 physical assistance, +2 safety/equipment Gait Distance (Feet): 15 Feet Assistive device: Rolling walker (2 wheels) Gait Pattern/deviations: Step-to pattern, Decreased stride length, Narrow base of support, Decreased step length - left, Decreased step length - right General Gait Details: pt ambulated 26ft x2 with seated rest break between and close chair follow (provided by 3rd person). Seated rest break due to UE muscle fatigue. No overt LOB. Cues for upright posture, increased step height for reduction of fall risk, forward gaze. x1 instance of bumping into door when leaving from room, increased time for navigating. Gait velocity: decreased    Posture / Balance Dynamic Sitting Balance Sitting balance - Comments: static sitting balance with CGA and min cues Balance Overall balance assessment: History of Falls, Needs assistance Sitting-balance support: Feet supported, No upper extremity supported Sitting balance-Leahy Scale: Fair Sitting balance - Comments: static sitting balance with CGA and min cues Standing balance support: Bilateral upper extremity supported, During functional activity, Reliant on assistive device for balance Standing balance-Leahy Scale: Poor    Special considerations/life events  Skin *** and Special service needs ***   Previous Home Environment (from acute therapy documentation) Living Arrangements: Spouse/significant other  Lives With: Spouse Available Help at Discharge: Family Type of Home: House Home Layout: One level Home Access: Stairs to enter Entrance Stairs-Rails: Can reach both Entrance Stairs-Number of Steps: 3 in front, 2 +1 side door Bathroom Shower/Tub: Health Visitor: Handicapped height Bathroom Accessibility: Yes How Accessible: Accessible via walker Home Care Services:  No Additional Comments: husband can assist as needed at home, has other family that live nearby and reports they call and check in on daily.  Discharge Living Setting Plans for Discharge Living Setting: Patient's home Type of Home at Discharge: House Discharge Home Layout: One level Discharge Home Access: Stairs to enter Entrance Stairs-Rails: Can reach both Entrance Stairs-Number of Steps: 3 Discharge Bathroom Shower/Tub: Walk-in shower Discharge Bathroom Toilet: Handicapped height Discharge Bathroom Accessibility: Yes How Accessible: Accessible via walker Does the patient have any problems obtaining your medications?: No  Social/Family/Support Systems Patient Roles: Spouse Contact Information: [place] Anticipated Caregiver: [phone] Anticipated Caregiver's Contact Information: Spouse 24/7 min A Caregiver Availability: 24/7 Discharge Plan Discussed with Primary Caregiver: Yes Is Caregiver In Agreement with Plan?: Yes Does Caregiver/Family have Issues with Lodging/Transportation while Pt is in Rehab?: No  Goals Patient/Family Goal for Rehab: PT/OT/SLP Min A Expected length of stay: 12-14 days Pt/Family Agrees to Admission and willing to participate: Yes Program Orientation Provided & Reviewed with Pt/Caregiver Including Roles  & Responsibilities: Yes  Decrease burden of Care through IP rehab admission: Not anticipated  Possible need for SNF placement upon discharge: Not anticipated  Patient Condition: I have reviewed medical records from  [facility], spoken with CM, and patient and family member. I discussed via phone for inpatient rehabilitation assessment.  Patient will benefit from ongoing PT and OT, can actively participate in 3 hours of therapy a day 5 days of the week, and can make measurable gains during the admission.  Patient will also benefit from the coordinated team approach during an Inpatient Acute Rehabilitation admission.  The patient will  receive intensive therapy as well as Rehabilitation physician, nursing, social worker, and care management interventions.  Due to safety, skin/wound care, disease management, medication administration, pain management, and patient education the patient requires 24 hour a day rehabilitation nursing.  The patient is currently *** with mobility and basic ADLs.  Discharge setting and therapy post discharge at home with home health is anticipated.  Patient has agreed to participate in the Acute Inpatient Rehabilitation Program and will admit {Time; today/tomorrow:10263}.  Preadmission Screen Completed By:  Leita KATHEE Kleine, 11/02/2024 2:13 PM ______________________________________________________________________   Discussed status with Dr. PIERRETTE on *** at *** and received approval for admission today.  Admission Coordinator:  Leita KATHEE Kleine, CCC-SLP, time PIERRETTEPattricia ***   Assessment/Plan: Diagnosis: *** Does the need for close, 24 hr/day Medical supervision in concert with the patient's rehab needs make it unreasonable for this patient to be served in a less intensive setting? {yes_no_potentially:3041433} Co-Morbidities requiring supervision/potential complications: *** Due to {due un:6958565}, does the patient require 24 hr/day rehab nursing? {yes_no_potentially:3041433} Does the patient require coordinated care of a physician, rehab nurse, PT, OT, and SLP to address physical and functional deficits in the context of the above medical diagnosis(es)? {yes_no_potentially:3041433} Addressing deficits in the following areas: {deficits:3041436} Can the patient actively participate in an intensive therapy program of at least 3 hrs of therapy 5 days a week? {yes_no_potentially:3041433} The potential for patient to make measurable gains while on inpatient rehab is {potential:3041437} Anticipated functional outcomes upon discharge from inpatient rehab: {functional outcomes:304600100} PT, {functional outcomes:304600100}  OT, {functional outcomes:304600100} SLP Estimated rehab length of stay to reach the above functional goals is: *** Anticipated discharge destination: {anticipated dc setting:21604} 10. Overall Rehab/Functional Prognosis: {potential:3041437}   MD Signature: ***     [1]  Current Facility-Administered Medications:    acetaminophen  (TYLENOL ) tablet 650 mg, 650 mg, Oral, Q6H PRN, Ghimire, Kuber, MD, 650 mg at 11/02/24 9072   albuterol  (PROVENTIL ) (2.5 MG/3ML) 0.083% nebulizer solution 2.5 mg, 2.5 mg, Nebulization, Q2H PRN, Zella, Mir M, MD   aspirin  chewable tablet 81 mg, 81 mg, Oral, BID, Ghimire, Kuber, MD, 81 mg at 11/02/24 9072   cephALEXin  (KEFLEX ) capsule 500 mg, 500 mg, Oral, Q8H, Ghimire, Kuber, MD, 500 mg at 11/02/24 0556   cholecalciferol  (VITAMIN D3) 25 MCG (1000 UNIT) tablet 2,000 Units, 2,000 Units, Oral, Daily, Raenelle Coria, MD, 2,000 Units at 11/02/24 9072   collagenase  (SANTYL ) ointment, , Topical, Daily, Zella, Mir M, MD, Given at 11/02/24 9071   enoxaparin  (LOVENOX ) injection 40 mg, 40 mg, Subcutaneous, Q24H, Zella, Mir M, MD, 40 mg at 11/02/24 9072   hydrocerin (EUCERIN) cream, , Topical, Daily, Zella, Mir M, MD, Given at 11/02/24 9071   iohexol  (OMNIPAQUE ) 300 MG/ML solution 75 mL, 75 mL, Intravenous, Once PRN, Griselda Norris, MD   losartan  (COZAAR ) tablet 50 mg, 50 mg, Oral, Daily, Ghimire, Kuber, MD, 50 mg at 11/02/24 1344   Muscle Rub CREA, , Topical, PRN, Ghimire, Kuber, MD   ondansetron  (ZOFRAN ) tablet 4 mg, 4 mg, Oral, Q6H PRN **OR** ondansetron  (ZOFRAN ) injection 4 mg, 4 mg,  Intravenous, Q6H PRN, Zella, Mir M, MD   pantoprazole  (PROTONIX ) EC tablet 40 mg, 40 mg, Oral, Daily, Ghimire, Kuber, MD, 40 mg at 11/02/24 9072   traZODone  (DESYREL ) tablet 25 mg, 25 mg, Oral, QHS PRN, Zella, Mir CHRISTELLA, MD

## 2024-11-02 NOTE — Progress Notes (Addendum)
 Physical Medicine & Rehabilitation Consult Service  Pt discussed with rehab admissions coordinator. Chart has been reviewed. This is a 84 year old female whose had progressive weakness at home over the last 3 weeks with falls and confusion.  She is ultimately brought to the emergency room on 10/28/2024 with back pain loss of balance and hallucinations.  Workup revealed a white count of 12,000.  She was was found to have a cellulitis of her right lower extremity and subsequent sepsis with encephalopathy.  Patient was treated with IV Ancef  with transition to oral Keflex .  Imaging of the brain spine etc. was negative.  Blood pressure has been elevated.  She has been up with therapy and requires mod assist for sit to stand transfers and walked 15 feet with min assist using rolling walker and min to mod assist with ADLs..  Prior to arrival patient was modified independent using a quad cane.  She was independent with most of her ADLs except for shoes and socks.  Patient cooks and cleans at home.  She does not drive.   Home: Home Living Family/patient expects to be discharged to:: Private residence Living Arrangements: Spouse/significant other Available Help at Discharge: Family Type of Home: House Home Access: Stairs to enter [employer] of Steps: 3 in front, 2 +1 side door Entrance Stairs-Rails: Can reach both Home Layout: One level Bathroom Shower/Tub: Health Visitor: Handicapped height Home Equipment: Rollator (4 wheels), Cane - single point Additional Comments: husband can assist as needed at home, has other family that live nearby and reports they call and check in on daily.  Functional History: Prior Function Prior Level of Function : Independent/Modified Independent Mobility Comments: quad cane at baseline. ~5 falls in past year. sleeps in lift recliner since fall last september due to difficulty laying flat feels like weight on my chest ADLs Comments: Reports  independent with bathing, dressing (spouse assist with socks and wears slip on shoes), cooks, and cleans at home. Does not drive ~8bm since L elbow surgery. Functional Status:  Mobility: Bed Mobility Overal bed mobility: Needs Assistance Bed Mobility: Supine to Sit, Sit to Supine Supine to sit: Mod assist, Used rails, HOB elevated Sit to supine: Min assist General bed mobility comments: Pt with good initation of B LEs toward EOB, increased time. use of bed rails and up to MOD A for trunk to upright. Able to scoot towards edge of bed with increased time and close supervision this date.  MIN A for LEs back to bed and for trunk control back to supine. Transfers Overall transfer level: Needs assistance Equipment used: Rolling walker (2 wheels) Transfers: Sit to/from Stand Sit to Stand: Mod assist, +2 safety/equipment General transfer comment: Pt performed 2 stands from EOB, requiring a seated rest break between stands. She demonstrated a standing tolerance of 30 seconds the first time, then ~45 seconds the 2nd stand. She needed mod verbal cues for trunk extension and proper hand placement on walker. Ambulation/Gait Ambulation/Gait assistance: Min assist, +2 physical assistance, +2 safety/equipment Gait Distance (Feet): 15 Feet Assistive device: Rolling walker (2 wheels) Gait Pattern/deviations: Step-to pattern, Decreased stride length, Narrow base of support, Decreased step length - left, Decreased step length - right General Gait Details: pt ambulated 110ft x2 with seated rest break between and close chair follow (provided by 3rd person). Seated rest break due to UE muscle fatigue. No overt LOB. Cues for upright posture, increased step height for reduction of fall risk, forward gaze. x1 instance of bumping into door when  leaving from room, increased time for navigating. Gait velocity: decreased    ADL: ADL Overall ADL's : Needs assistance/impaired Eating/Feeding: Set up, Sitting Grooming:  Minimal assistance, Sitting Grooming Details (indicate cue type and reason): Pt performed hair combing seated EOB. She required assist to assess/comb the posterior aspect of her scalp, given proximal RUE AROM limitations. Upper Body Bathing: Contact guard assist, Sitting Lower Body Bathing: Maximal assistance, Sitting/lateral leans Upper Body Dressing : Minimal assistance, Sitting Upper Body Dressing Details (indicate cue type and reason): Pt required assist to doff a hospital gown and to donn another clean one seated EOB; assist mostly needed for pt to pull sleeves of gown up over her shoulders. Lower Body Dressing: Maximal assistance, Sitting/lateral leans Lower Body Dressing Details (indicate cue type and reason): she required significant assist to donn socks seated EOB Toilet Transfer: Moderate assistance, +2 for physical assistance, Rolling walker (2 wheels), BSC/3in1, Cueing for sequencing, Stand-pivot Toileting- Clothing Manipulation and Hygiene: Maximal assistance, Sit to/from stand, Cueing for compensatory techniques, Cueing for sequencing Toileting - Clothing Manipulation Details (indicate cue type and reason): at bedside commode level, based on clinical judgement  Cognition: Cognition Orientation Level: Oriented to person, Oriented to place, Oriented to time Cognition Arousal: Alert Behavior During Therapy: [facility] for tasks assessed/performed   Assessment: 84 year old female with cellulitis and resulting sepsis.  She has become deconditioned, given her decline  has taken place over the last 3+ weeks. Metabolic/infectious encephalopathy RLE cellulitis with wound care/ID needs   Plan:  This patient would benefit from acute inpatient rehab to address functional mobility and self-care. Additionally, the patient requires daily MD oversight of the active medical issues noted above. Projected goals would be modified independent to supervision with mobility and modified independent min  assist with ADLs with an ELOS of 7 to 9 days.  Dispo and social supports are appropriate.    Rehab Admissions Coordinator to follow up.    Arthea IVAR Gunther, MD, [facility] Physical Medicine & Rehabilitation Medical Director Rehabilitation Services 11/02/2024

## 2024-11-02 NOTE — TOC Progression Note (Signed)
 Transition of Care [facility]) - Progression Note    Patient Details  Name: Bethany Wolfe MRN: [id number] Date of Birth: 1941-01-27  Transition of Care [facility]) CM/SW Contact  Alfonse JONELLE Rex, RN Phone Number: 11/02/2024, 11:26 AM  Clinical Narrative:   PT eval completed, recommendation for Acute Inpatient Rehab, Screened by CIR today, appears to be a candidate, CIR full assessment pending.     Expected Discharge Plan: Skilled Nursing Facility Barriers to Discharge: Continued Medical Work up               Expected Discharge Plan and Services In-house Referral: Clinical Social Work Discharge Planning Services: NA Post Acute Care Choice: NA Living arrangements for the past 2 months: Single Family Home                 DME Arranged: N/A DME Agency: NA       HH Arranged: NA HH Agency: NA         Social Drivers of Health (SDOH) Interventions SDOH Screenings   Food Insecurity: No Food Insecurity (10/29/2024)  Housing: Low Risk (10/29/2024)  Transportation Needs: No Transportation Needs (10/29/2024)  Utilities: Not At Risk (10/29/2024)  Alcohol Screen: Low Risk (03/22/2024)  Depression (PHQ2-9): Medium Risk (10/03/2024)  Financial Resource Strain: Low Risk (03/22/2024)  Physical Activity: Inactive (03/22/2024)  Social Connections: Moderately Isolated (10/29/2024)  Stress: No Stress Concern Present (03/22/2024)  Tobacco Use: Medium Risk (10/28/2024)  Health Literacy: Adequate Health Literacy (03/22/2024)    Readmission Risk Interventions    10/30/2024    2:12 PM  Readmission Risk Prevention Plan  Post Dischage Appt Complete  Medication Screening Complete  Transportation Screening Complete

## 2024-11-02 NOTE — Progress Notes (Signed)
 Inpatient Rehab Admissions Coordinator:   I spoke with Pt. Regarding potential CIR admit. She is interested and states that her spouse can provide 24/7 min assist at home. I reviewed case with rehab MD who feels that Pt. Is an appropriate candidate for CIR. I will send her case to insurance today.   Shelly Kleine, MS, CCC-SLP Rehab Admissions Coordinator  [phone] (celll) [phone] (office)

## 2024-11-03 LAB — CULTURE, BLOOD (ROUTINE X 2)
Culture: NO GROWTH
Culture: NO GROWTH
Special Requests: ADEQUATE

## 2024-11-03 MED ORDER — CEPHALEXIN 500 MG PO CAPS
500.0000 mg | ORAL_CAPSULE | Freq: Three times a day (TID) | ORAL | Status: AC
  Administered 2024-11-03 – 2024-11-07 (×12): 500 mg via ORAL
  Filled 2024-11-03 (×12): qty 1

## 2024-11-03 MED ORDER — CYANOCOBALAMIN 1000 MCG/ML IJ SOLN
1000.0000 ug | Freq: Once | INTRAMUSCULAR | Status: AC
  Administered 2024-11-03: 1000 ug via INTRAMUSCULAR
  Filled 2024-11-03: qty 1

## 2024-11-03 NOTE — Plan of Care (Signed)

## 2024-11-03 NOTE — Plan of Care (Signed)

## 2024-11-03 NOTE — Progress Notes (Signed)
 " PROGRESS NOTE  Bethany Wolfe FMW:[id number] DOB: [birth year] DOA: 10/28/2024 PCP: Iven Lang DASEN, PA-C   LOS: 5 days   Brief Narrative / Interim history: 84 year old female with history of HTN, OA came into the hospital and was admitted on 1/5 with progressive confusion and recurrent falls.  Family also reported visual hallucinations.  She was found to have right lower extremity cellulitis, leg wound, leukocytosis, was placed on antibiotics and admitted to the hospital.  Extensive imaging in the ED negative for acute findings.  She recovered with antibiotics, mental status has returned to baseline, CIR was recommended and placement is pending insurance authorization  Subjective / 24h Interval events: She is doing well this morning, denies any chest pain, denies any shortness of breath.  She is feeling much better.  Assesement and Plan: Principal problem Severe sepsis due to right lower extremity cellulitis, right lower extremity wound-patient met sepsis criteria with tachycardia, leukocytosis unknown source.  She was also encephalopathic.  She was placed on antibiotics with Ancef , transition to Keflex , continue to complete a total of 10-day course with 4 additional days remaining - PT recommended CIR, placement pending insurance authorization. - Cellulitis improving. - Right lower extremity wound was due to recent fall, she saw her PCP in December and had a big blister at that location which appears to have popped.  Continue local wound care  Active problems Acute metabolic encephalopathy-due to #1, now resolved, she is back to baseline and alert and oriented x 4.  MRI of the brain and C-spine without obvious abnormalities.  TSH, ammonia, UDS negative  Relative B12 deficiency-B12 low normal.  Give B12 x 1 IM today.  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, she would benefit from weight loss  Awaiting  placement to CIR  Scheduled Meds:  aspirin   81 mg Oral BID   cephALEXin   500 mg Oral Q8H   cholecalciferol   2,000 Units Oral Daily   collagenase    Topical Daily   enoxaparin  (LOVENOX ) injection  40 mg Subcutaneous Q24H   hydrocerin   Topical Daily   losartan   50 mg Oral Daily   pantoprazole   40 mg Oral Daily   Continuous Infusions: PRN Meds:.acetaminophen , albuterol , iohexol , Muscle Rub, ondansetron  **OR** ondansetron  (ZOFRAN ) IV, traZODone   Current Outpatient Medications  Medication Instructions   acetaminophen  (TYLENOL ) 650 mg, Every 8 hours PRN   albuterol  (ACCUNEB ) 1.25 mg, Nebulization, Every 6 hours PRN   albuterol  (VENTOLIN  HFA) 108 (90 Base) MCG/ACT inhaler TAKE 2 PUFFS BY MOUTH EVERY 6 HOURS AS NEEDED FOR WHEEZE OR SHORTNESS OF BREATH   aspirin  81 mg, Oral, 2 times daily   fluticasone  (FLONASE ) 50 MCG/ACT nasal spray 2 sprays, Each Nare, Daily   indapamide  (LOZOL ) 1.25 mg, Oral, Daily   losartan  (COZAAR ) 50 mg, Oral, Daily   mupirocin  ointment (BACTROBAN ) 2 % Topical, Daily, Apply to area on buttock.   pantoprazole  (PROTONIX ) 40 mg, Oral, Daily   triamcinolone  ointment (KENALOG ) 0.5 % 1 Application, Topical, 2 times daily, Use for only one week at a time before taking one week off.   urea  (CARMOL) 40 % CREA Apply topically to legs 2 (two) times daily for 1-2 weeks   Vitamin D3 2,000 Units, Oral, Daily    Diet Orders (From admission, onward)     Start     Ordered   10/29/24 0802  Diet regular Room service appropriate? Yes; Fluid consistency: Thin  Diet effective now  Question Answer Comment  Room service appropriate? Yes   Fluid consistency: Thin      10/29/24 0801            DVT prophylaxis: enoxaparin  (LOVENOX ) injection 40 mg Start: 10/29/24 1000   Lab Results  Component Value Date   PLT 250 10/30/2024      Code Status: Full Code  Family Communication: No family at bedside  Status is: Inpatient Remains inpatient appropriate because: Awaiting  placement   Level of care: Telemetry  Consultants:  None  Objective: Vitals:   11/02/24 0614 11/02/24 1254 11/02/24 1939 11/03/24 0603  BP: (!) 149/69 121/74 (!) 146/66 132/75  Pulse: 80 71 75 84  Resp: 20 18 18 19   Temp: 98.6 F (37 C) 97.9 F (36.6 C) 98.1 F (36.7 C) 98.4 F (36.9 C)  TempSrc:   Oral Oral  SpO2: 95% 97% 93%   Weight:      Height:        Intake/Output Summary (Last 24 hours) at 11/03/2024 1150 Last data filed at 11/03/2024 0830 Gross per 24 hour  Intake 360 ml  Output --  Net 360 ml   Wt Readings from Last 3 Encounters:  10/29/24 106.6 kg  10/24/24 107.5 kg  10/03/24 112.5 kg    Examination:  Constitutional: NAD Eyes: no scleral icterus ENMT: Mucous membranes are moist.  Neck: normal, supple Respiratory: clear to auscultation bilaterally, no wheezing, no crackles.  Cardiovascular: Regular rate and rhythm, no murmurs / rubs / gallops. No LE edema.  Abdomen: non distended, no tenderness. Bowel sounds positive.  Musculoskeletal: no clubbing / cyanosis.   Data Reviewed: I have independently reviewed following labs and imaging studies   CBC Recent Labs  Lab 10/28/24 2240 10/30/24 0545  WBC 12.8* 7.4  HGB 12.6 10.5*  HCT 39.6 34.1*  PLT 373 250  MCV 83.0 84.8  MCH 26.4 26.1  MCHC 31.8 30.8  RDW 13.5 14.0    Recent Labs  Lab 10/28/24 2240 10/29/24 0148 10/29/24 0733 10/29/24 0835 10/30/24 0545  NA 140  --   --   --  140  K 3.9  --   --   --  3.6  CL 102  --   --   --  105  CO2 29  --   --   --  27  GLUCOSE 133*  --   --   --  110*  BUN 22  --   --   --  13  CREATININE 1.17*  --   --   --  0.74  CALCIUM 10.3  --   --   --  9.3  AST  --  30  --   --   --   ALT  --  14  --   --   --   ALKPHOS  --  94  --   --   --   BILITOT  --  0.4  --   --   --   ALBUMIN  --  3.6  --   --   --   LATICACIDVEN  --   --   --  1.1  --   TSH  --   --  1.530  --   --   AMMONIA  --   --  50*  --   --      ------------------------------------------------------------------------------------------------------------------ No results for input(s): CHOL, HDL, LDLCALC, TRIG, CHOLHDL, LDLDIRECT in the last 72 hours.  Lab Results  Component Value  Date   HGBA1C 5.6 08/15/2024   ------------------------------------------------------------------------------------------------------------------ No results for input(s): TSH, T4TOTAL, T3FREE, THYROIDAB in the last 72 hours.  Invalid input(s): FREET3  Cardiac Enzymes No results for input(s): CKMB, TROPONINI, MYOGLOBIN in the last 168 hours.  Invalid input(s): CK ------------------------------------------------------------------------------------------------------------------    Component Value Date/Time   BNP 38.4 08/11/2023 1122    CBG: No results for input(s): GLUCAP in the last 168 hours.  Recent Results (from the past 240 hours)  Culture, blood (Routine X 2) w Reflex to ID Panel     Status: None   Collection Time: 10/29/24  8:35 AM   Specimen: BLOOD  Result Value Ref Range Status   Specimen Description   Final    BLOOD LEFT ANTECUBITAL Performed at [facility], [address] [place], [place] [address]    Special Requests   Final    BOTTLES DRAWN AEROBIC AND ANAEROBIC Blood Culture results may not be optimal due to an inadequate volume of blood received in culture bottles Performed at [facility], [address], [place], [place] [address]    Culture   Final    NO GROWTH 5 DAYS Performed at [facility], [address], [place], [place] [address]    Report Status 11/03/2024 FINAL  Final  Culture, blood (Routine X 2) w Reflex to ID Panel     Status: None   Collection Time: 10/29/24  8:40 AM   Specimen: BLOOD LEFT HAND  Result Value Ref Range Status   Specimen Description   Final    BLOOD LEFT HAND Performed at [facility], [address], [place], [place] [address]    Special Requests   Final    BOTTLES DRAWN AEROBIC AND ANAEROBIC Blood Culture adequate volume Performed at [facility], [address] [place], [place] [address]    Culture   Final    NO GROWTH 5 DAYS Performed at [facility], [address] [place], [place] [address]    Report Status 11/03/2024 FINAL  Final     Radiology Studies: No results found.   Nilda Fendt, MD, PhD [facility]  Between 7 am - 7 pm I am available, please contact me via Amion (for emergencies) or Securechat (non urgent messages)  Between 7 pm - 7 am I am not available, please contact night coverage MD/APP via Amion  "

## 2024-11-03 NOTE — Progress Notes (Signed)
 Physical Therapy Treatment Patient Details Name: Bethany Wolfe MRN: [id number] DOB: October 02, 1941 Today's Date: 11/03/2024   History of Present Illness 84 yo female presents to therapy from ED secondary to 5 falls in the past 3 wks one of which on date of admission 10/28/2024. Pt reportedly fell backward and struck her head, unclear of LOC and pt is not currently reporting vision changes or N and V. Pt c/o B hip pain, R  elbow, chest and LBP. Family endorses progressive confusion over the past 3 wks and well as decline with UE motor control and coordination. Pt found to have LE wounds and hematoma and dx with sepsis secondary to R LE cellulitis as well as encephalopathy. Pt PMH includes but is not limited to: HTN, HLD, GERD, asthma, L humeral fx s/p ORIF (2024), lumbar surgery and R ankle surgery.    PT Comments  Pt continues to progress toward acute PT goals this session with progression of ambulation distance and decreased assist required for mobility this date. Pt performed bed mobility with up to MIN A with HOB elevated and use of bed rails. Sit to stand transfers with up to MOD A+2, progressed to MOD A+1 during session with cues for proper hand placement and positioning. Pt ambulated ~61ft with MIN A and close chair follow provided for safety. Pt will benefit from continued skilled PT to increase their independence and maximize safety with mobility.      If plan is discharge home, recommend the following: Two people to help with walking and/or transfers;Two people to help with bathing/dressing/bathroom;Assistance with cooking/housework;Direct supervision/assist for medications management;Direct supervision/assist for financial management;Assist for transportation;Help with stairs or ramp for entrance;Supervision due to cognitive status   Can travel by private vehicle     No  Equipment Recommendations  Rolling walker (2 wheels)    Recommendations for Other Services       Precautions /  Restrictions Precautions Precautions: Fall Restrictions Weight Bearing Restrictions Per Provider Order: No     Mobility  Bed Mobility Overal bed mobility: Needs Assistance Bed Mobility: Supine to Sit, Sit to Supine     Supine to sit: Min assist, HOB elevated, Used rails Sit to supine: Supervision, Used rails   General bed mobility comments: use of bed rails, pt motivated to perform more independently, MIN HHA for trunk to upright. increased time and effort for B LEs onto EOB,    Transfers Overall transfer level: Needs assistance Equipment used: Rolling walker (2 wheels) Transfers: Sit to/from Stand, Bed to chair/wheelchair/BSC Sit to Stand: Mod assist, +2 safety/equipment   Step pivot transfers: Min assist       General transfer comment: sit to stand x3 total from EOB x2 and BSC during session. Increased time for problem solving with hand placement to assist with power up to stand. Able to progress to MOD A+1 from [facility] with cues for hand placement. MIN A for step transfer to [facility] [place] following ambulation- urgent need for toileting/bowel movement.    Ambulation/Gait Ambulation/Gait assistance: Min assist Gait Distance (Feet): 55 Feet Assistive device: Rolling walker (2 wheels) Gait Pattern/deviations: Step-to pattern, Decreased stride length, Narrow base of support, Decreased step length - left, Decreased step length - right Gait velocity: decreased     General Gait Details: 27ft total with close chair follow behind for safety. Demonstration of AD use prior to amublation. Cues for increased step length/foot clearance for reduction of fall risk.   Stairs  Wheelchair Mobility     Tilt Bed    Modified Rankin (Stroke Patients Only)       Balance Overall balance assessment: History of Falls, Needs assistance Sitting-balance support: Feet supported, No upper extremity supported Sitting balance-Leahy Scale: Fair     Standing balance support: Bilateral  upper extremity supported, During functional activity, Reliant on assistive device for balance Standing balance-Leahy Scale: Poor Standing balance comment: reliant on external support                            Communication Communication Communication: No apparent difficulties  Cognition Arousal: Alert Behavior During Therapy: WFL for tasks assessed/performed   PT - Cognitive impairments: No apparent impairments                         Following commands: Intact      Cueing Cueing Techniques: Verbal cues, Tactile cues  Exercises      General Comments        Pertinent Vitals/Pain Pain Assessment Pain Assessment: Faces Faces Pain Scale: Hurts little more Pain Location: R arm Pain Descriptors / Indicators: Aching, Discomfort Pain Intervention(s): Limited activity within patient's tolerance, Monitored during session    Home Living                          Prior Function            PT Goals (current goals can now be found in the care plan section) Acute Rehab PT Goals Patient Stated Goal: Feel better and go home PT Goal Formulation: With patient Progress towards PT goals: Progressing toward goals    Frequency    Min 3X/week      PT Plan      Co-evaluation              AM-PAC PT 6 Clicks Mobility   Outcome Measure  Help needed turning from your back to your side while in a flat bed without using bedrails?: A Lot Help needed moving from lying on your back to sitting on the side of a flat bed without using bedrails?: A Lot Help needed moving to and from a bed to a chair (including a wheelchair)?: Total Help needed standing up from a chair using your arms (e.g., wheelchair or bedside chair)?: Total Help needed to walk in hospital room?: A Little Help needed climbing 3-5 steps with a railing? : Total 6 Click Score: 10    End of Session Equipment Utilized During Treatment: Gait belt Activity Tolerance: Patient  tolerated treatment well Patient left: in bed;with call bell/phone within reach;with bed alarm set;with nursing/sitter in room (NT in room) Nurse Communication: Mobility status;Other (comment) (pt with bowel movement during session) PT Visit Diagnosis: Unsteadiness on feet (R26.81);Other abnormalities of gait and mobility (R26.89);Repeated falls (R29.6);Muscle weakness (generalized) (M62.81);Difficulty in walking, not elsewhere classified (R26.2)     Time: 1431-1500 PT Time Calculation (min) (ACUTE ONLY): 29 min  Charges:    $Therapeutic Activity: 23-37 mins PT General Charges $$ ACUTE PT VISIT: 1 Visit                     Tinnie BERRY PT, DPT  Acute Rehabilitation Services  Office [phone] 11/03/2024, 3:11 PM

## 2024-11-04 LAB — COMPREHENSIVE METABOLIC PANEL WITH GFR
ALT: <5 U/L (ref 0–44)
AST: 19 U/L (ref 15–41)
Albumin: 3.3 g/dL — ABNORMAL LOW (ref 3.5–5.0)
Alkaline Phosphatase: 78 U/L (ref 38–126)
Anion gap: 9 (ref 5–15)
BUN: 13 mg/dL (ref 8–23)
CO2: 29 mmol/L (ref 22–32)
Calcium: 9.6 mg/dL (ref 8.9–10.3)
Chloride: 104 mmol/L (ref 98–111)
Creatinine, Ser: 0.64 mg/dL (ref 0.44–1.00)
GFR, Estimated: >60 mL/min
Glucose, Bld: 105 mg/dL — ABNORMAL HIGH (ref 70–99)
Potassium: 4.3 mmol/L (ref 3.5–5.1)
Sodium: 142 mmol/L (ref 135–145)
Total Bilirubin: 0.3 mg/dL (ref 0.0–1.2)
Total Protein: 5.9 g/dL — ABNORMAL LOW (ref 6.5–8.1)

## 2024-11-04 LAB — CBC
HCT: 36.8 % (ref 36.0–46.0)
Hemoglobin: 11.3 g/dL — ABNORMAL LOW (ref 12.0–15.0)
MCH: 25.7 pg — ABNORMAL LOW (ref 26.0–34.0)
MCHC: 30.7 g/dL (ref 30.0–36.0)
MCV: 83.8 fL (ref 80.0–100.0)
Platelets: 249 K/uL (ref 150–400)
RBC: 4.39 MIL/uL (ref 3.87–5.11)
RDW: 14 % (ref 11.5–15.5)
WBC: 6.8 K/uL (ref 4.0–10.5)
nRBC: 0 % (ref 0.0–0.2)

## 2024-11-04 LAB — MAGNESIUM: Magnesium: 2.4 mg/dL (ref 1.7–2.4)

## 2024-11-04 MED ORDER — VITAMIN B-12 1000 MCG PO TABS
1000.0000 ug | ORAL_TABLET | Freq: Every day | ORAL | Status: DC
  Administered 2024-11-04 – 2024-11-07 (×4): 1000 ug via ORAL
  Filled 2024-11-04 (×4): qty 1

## 2024-11-04 NOTE — Plan of Care (Signed)
  Problem: Education: Goal: Knowledge of General Education information will improve Description: Including pain rating scale, medication(s)/side effects and non-pharmacologic comfort measures Outcome: Progressing   Problem: Clinical Measurements: Goal: Ability to maintain clinical measurements within normal limits will improve Outcome: Progressing Goal: Diagnostic test results will improve Outcome: Progressing   Problem: Nutrition: Goal: Adequate nutrition will be maintained Outcome: Progressing   Problem: Coping: Goal: Level of anxiety will decrease Outcome: Progressing   Problem: Pain Managment: Goal: General experience of comfort will improve and/or be controlled Outcome: Progressing   Problem: Safety: Goal: Ability to remain free from injury will improve Outcome: Progressing

## 2024-11-04 NOTE — Progress Notes (Signed)
 " PROGRESS NOTE  RUQAYYA VENTRESS FMW:[id number] DOB: 31-Aug-1941 DOA: 10/28/2024 PCP: Iven Lang DASEN, PA-C   LOS: 6 days   Brief Narrative / Interim history: 84 year old female with history of HTN, OA came into the hospital and was admitted on 1/5 with progressive confusion and recurrent falls.  Family also reported visual hallucinations.  She was found to have right lower extremity cellulitis, leg wound, leukocytosis, was placed on antibiotics and admitted to the hospital.  Extensive imaging in the ED negative for acute findings.  She recovered with antibiotics, mental status has returned to baseline, CIR was recommended and placement is pending insurance authorization  Subjective / 24h Interval events: Doing well, no complaints, no chest pain, no shortness of breath.  No nausea or vomiting.  She has not heard anything from the [facility] about insurance authorization for inpatient rehab.    Assesement and Plan: Principal problem Severe sepsis due to right lower extremity cellulitis, right lower extremity wound-patient met sepsis criteria with tachycardia, leukocytosis unknown source.  She was also encephalopathic.  She was placed on antibiotics with Ancef , transition to Keflex , continue to complete a total of 10-day course with 3 additional days remaining - PT recommended CIR, placement pending insurance authorization. - Cellulitis improving. - Right lower extremity wound was due to recent fall, she saw her PCP in December and had a big blister at that location which appears to have popped.  Continue local wound care  Active problems Acute metabolic encephalopathy-due to #1, now resolved, she is back to baseline and alert and oriented x 4.  MRI of the brain and C-spine without obvious abnormalities.  TSH, ammonia, UDS negative.  Stable for discharge  Relative B12 deficiency-B12 low normal.  Give B12 x 1 IM today.  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, she would benefit from weight loss  Awaiting placement to CIR  Scheduled Meds:  aspirin   81 mg Oral BID   cephALEXin   500 mg Oral Q8H   cholecalciferol   2,000 Units Oral Daily   collagenase    Topical Daily   enoxaparin  (LOVENOX ) injection  40 mg Subcutaneous Q24H   hydrocerin   Topical Daily   losartan   50 mg Oral Daily   pantoprazole   40 mg Oral Daily   Continuous Infusions: PRN Meds:.acetaminophen , albuterol , iohexol , Muscle Rub, ondansetron  **OR** ondansetron  (ZOFRAN ) IV, traZODone   Current Outpatient Medications  Medication Instructions   acetaminophen  (TYLENOL ) 650 mg, Every 8 hours PRN   albuterol  (ACCUNEB ) 1.25 mg, Nebulization, Every 6 hours PRN   albuterol  (VENTOLIN  HFA) 108 (90 Base) MCG/ACT inhaler TAKE 2 PUFFS BY MOUTH EVERY 6 HOURS AS NEEDED FOR WHEEZE OR SHORTNESS OF BREATH   aspirin  81 mg, Oral, 2 times daily   fluticasone  (FLONASE ) 50 MCG/ACT nasal spray 2 sprays, Each Nare, Daily   indapamide  (LOZOL ) 1.25 mg, Oral, Daily   losartan  (COZAAR ) 50 mg, Oral, Daily   mupirocin  ointment (BACTROBAN ) 2 % Topical, Daily, Apply to area on buttock.   pantoprazole  (PROTONIX ) 40 mg, Oral, Daily   triamcinolone  ointment (KENALOG ) 0.5 % 1 Application, Topical, 2 times daily, Use for only one week at a time before taking one week off.   urea  (CARMOL) 40 % CREA Apply topically to legs 2 (two) times daily for 1-2 weeks   Vitamin D3 2,000 Units, Oral, Daily    Diet Orders (From admission, onward)     Start     Ordered   10/29/24  9197  Diet regular Room service appropriate? Yes; Fluid consistency: Thin  Diet effective now       Question Answer Comment  Room service appropriate? Yes   Fluid consistency: Thin      10/29/24 0801            DVT prophylaxis: enoxaparin  (LOVENOX ) injection 40 mg Start: 10/29/24 1000   Lab Results  Component Value Date   PLT 249 11/04/2024      Code Status: Full Code  Family Communication: No  family at bedside  Status is: Inpatient Remains inpatient appropriate because: Awaiting placement   Level of care: Telemetry  Consultants:  None  Objective: Vitals:   11/02/24 1939 11/03/24 0603 11/03/24 2008 11/04/24 0511  BP: (!) 146/66 132/75 115/64 136/73  Pulse: 75 84 73 73  Resp: 18 19 18    Temp: 98.1 F (36.7 C) 98.4 F (36.9 C) 98.4 F (36.9 C) 97.9 F (36.6 C)  TempSrc: Oral Oral Oral Oral  SpO2: 93%  95% 95%  Weight:      Height:        Intake/Output Summary (Last 24 hours) at 11/04/2024 0908 Last data filed at 11/03/2024 1600 [place] per 24 hour  Intake 120 ml  Output 200 ml  Net -80 ml   Wt Readings from Last 3 Encounters:  10/29/24 106.6 kg  10/24/24 107.5 kg  10/03/24 112.5 kg    Examination:  Constitutional: NAD Eyes: lids and conjunctivae normal, no scleral icterus ENMT: mmm Neck: normal, supple Respiratory: clear to auscultation bilaterally, no wheezing, no crackles. Normal respiratory effort.  Cardiovascular: Regular rate and rhythm, no murmurs / rubs / gallops. No LE edema. Abdomen: soft, no distention, no tenderness. Bowel sounds positive.   Data Reviewed: I have independently reviewed following labs and imaging studies   CBC Recent Labs  Lab 10/28/24 2240 10/30/24 0545 11/04/24 0544  WBC 12.8* 7.4 6.8  HGB 12.6 10.5* 11.3*  HCT 39.6 34.1* 36.8  PLT 373 250 249  MCV 83.0 84.8 83.8  MCH 26.4 26.1 25.7*  MCHC 31.8 30.8 30.7  RDW 13.5 14.0 14.0    Recent Labs  Lab 10/28/24 2240 10/29/24 0148 10/29/24 0733 10/29/24 0835 10/30/24 0545 11/04/24 0544  NA 140  --   --   --  140 142  K 3.9  --   --   --  3.6 4.3  CL 102  --   --   --  105 104  CO2 29  --   --   --  27 29  GLUCOSE 133*  --   --   --  110* 105*  BUN 22  --   --   --  13 13  CREATININE 1.17*  --   --   --  0.74 0.64  CALCIUM 10.3  --   --   --  9.3 9.6  AST  --  30  --   --   --  19  ALT  --  14  --   --   --  <5  ALKPHOS  --  94  --   --   --  78  BILITOT   --  0.4  --   --   --  0.3  ALBUMIN  --  3.6  --   --   --  3.3*  MG  --   --   --   --   --  2.4  LATICACIDVEN  --   --   --  1.1  --   --   TSH  --   --  1.530  --   --   --   AMMONIA  --   --  50*  --   --   --     ------------------------------------------------------------------------------------------------------------------ No results for input(s): CHOL, HDL, LDLCALC, TRIG, CHOLHDL, LDLDIRECT in the last 72 hours.  Lab Results  Component Value Date   HGBA1C 5.6 08/15/2024   ------------------------------------------------------------------------------------------------------------------ No results for input(s): TSH, T4TOTAL, T3FREE, THYROIDAB in the last 72 hours.  Invalid input(s): FREET3  Cardiac Enzymes No results for input(s): CKMB, TROPONINI, MYOGLOBIN in the last 168 hours.  Invalid input(s): CK ------------------------------------------------------------------------------------------------------------------    Component Value Date/Time   BNP 38.4 08/11/2023 1122    CBG: No results for input(s): GLUCAP in the last 168 hours.  Recent Results (from the past 240 hours)  Culture, blood (Routine X 2) w Reflex to ID Panel     Status: None   Collection Time: 10/29/24  8:35 AM   Specimen: BLOOD  Result Value Ref Range Status   Specimen Description   Final    BLOOD LEFT ANTECUBITAL Performed at [facility], [address] [place], [place] [address]    Special Requests   Final    BOTTLES DRAWN AEROBIC AND ANAEROBIC Blood Culture results may not be optimal due to an inadequate volume of blood received in culture bottles Performed at [facility], [address], [place], [place] [address]    Culture   Final    NO GROWTH 5 DAYS Performed at [facility], [address], [place], [place] [address]    Report Status 11/03/2024 FINAL  Final  Culture, blood (Routine X 2) w Reflex to ID Panel      Status: None   Collection Time: 10/29/24  8:40 AM   Specimen: BLOOD LEFT HAND  Result Value Ref Range Status   Specimen Description   Final    BLOOD LEFT HAND Performed at [facility], [address] [place], [place] [address]    Special Requests   Final    BOTTLES DRAWN AEROBIC AND ANAEROBIC Blood Culture adequate volume Performed at [facility], [address] [place], [place] [address]    Culture   Final    NO GROWTH 5 DAYS Performed at [facility], [address], [place], [place] [address]    Report Status 11/03/2024 FINAL  Final     Radiology Studies: No results found.   Nilda Fendt, MD, PhD [facility]  Between 7 am - 7 pm I am available, please contact me via Amion (for emergencies) or Securechat (non urgent messages)  Between 7 pm - 7 am I am not available, please contact night coverage MD/APP via Amion  "

## 2024-11-05 DIAGNOSIS — L0390 Cellulitis, unspecified: Secondary | ICD-10-CM | POA: Diagnosis not present

## 2024-11-05 DIAGNOSIS — A419 Sepsis, unspecified organism: Secondary | ICD-10-CM | POA: Diagnosis not present

## 2024-11-05 MED ORDER — LORATADINE 10 MG PO TABS
10.0000 mg | ORAL_TABLET | Freq: Every day | ORAL | Status: DC
  Administered 2024-11-05 – 2024-11-07 (×3): 10 mg via ORAL
  Filled 2024-11-05 (×3): qty 1

## 2024-11-05 NOTE — Progress Notes (Signed)
 Inpatient Rehab Admissions Coordinator:   CIR following. Rehab MD to call for peer to peer this morning. If denied, will need SNF.   Leita Kleine, MS, CCC-SLP Rehab Admissions Coordinator  [phone] (celll) [phone] (office)

## 2024-11-05 NOTE — Progress Notes (Signed)
 Inpatient Rehab Admissions Coordinator:    CIR received insurance denial. TOC notified. Pt. Likely to need SNF.   Leita Kleine, MS, CCC-SLP Rehab Admissions Coordinator  [phone] (celll) [phone] (office)

## 2024-11-05 NOTE — TOC Progression Note (Addendum)
 Transition of Care [facility]) - Progression Note    Patient Details  Name: Bethany Wolfe MRN: [id number] Date of Birth: Nov 28, 1940  Transition [facility] CM/SW Contact  Heather DELENA Saltness, LCSW Phone Number: 11/05/2024, 1:57 PM  Clinical Narrative:     ADDENDUM  Pt chooses to accept bed offer at [facility]. [facility] at [employer] notified. CSW will request insurance authorization tomorrow morning during business hours.  CSW notified by [facility] admissions coordinator, Leita Kleine, that pt's insurance authorization for AIR at [facility] has been denied. CSW spoke with pt and spouse at bedside to provide update. CSW advised pt and spouse of insurance denying request for CIR. CSW advised of option to pursue short-term rehab at [facility]. CSW provided pt and spouse with list of SNFs that have available beds including name of facility, location, and Medicare Star-Rating. Pt and spouse will discuss options prior to making final decision. TOC will continue to follow.  Medicare Star-Ratings  [facility] [address] [place], [place] [address] [phone] Overall rating ?????  [facility] [address] [place], [place] [address] [phone] Overall rating ??? Much below average  [facility] [address] [place], [place] [address] [phone] Overall rating ???? Above average  [facility] [address] [place], [place] [address] [phone] Overall rating ???? Below average   Expected Discharge Plan: Skilled Nursing Facility Barriers to Discharge: Continued Medical Work up   Expected Discharge Plan and Services In-house Referral: Clinical Social Work Discharge Planning Services: NA Post Acute Care Choice: NA Living arrangements for the past 2 months: Single Family Home                 DME Arranged: N/A DME Agency: NA       HH Arranged:  NA HH Agency: NA         Social Drivers of Health (SDOH) Interventions SDOH Screenings   Food Insecurity: No Food Insecurity (10/29/2024)  Housing: Low Risk (10/29/2024)  Transportation Needs: No Transportation Needs (10/29/2024)  Utilities: Not At Risk (10/29/2024)  Alcohol Screen: Low Risk (03/22/2024)  Depression (PHQ2-9): Medium Risk (10/03/2024)  Financial Resource Strain: Low Risk (03/22/2024)  Physical Activity: Inactive (03/22/2024)  Social Connections: Moderately Isolated (10/29/2024)  Stress: No Stress Concern Present (03/22/2024)  Tobacco Use: Medium Risk (10/28/2024)  Health Literacy: Adequate Health Literacy (03/22/2024)    Readmission Risk Interventions    10/30/2024    2:12 PM  Readmission Risk Prevention Plan  Post Dischage Appt Complete  Medication Screening Complete  Transportation Screening Complete    Signed: Heather Saltness, MSW, LCSW Clinical Social Worker Inpatient Care Management 11/05/2024 4:14 PM

## 2024-11-05 NOTE — Plan of Care (Signed)
  Problem: Activity: Goal: Risk for activity intolerance will decrease Outcome: Progressing   Problem: Nutrition: Goal: Adequate nutrition will be maintained Outcome: Progressing   Problem: Coping: Goal: Level of anxiety will decrease Outcome: Progressing   Problem: Pain Managment: Goal: General experience of comfort will improve and/or be controlled Outcome: Progressing   Problem: Safety: Goal: Ability to remain free from injury will improve Outcome: Progressing

## 2024-11-05 NOTE — Plan of Care (Signed)

## 2024-11-05 NOTE — Progress Notes (Signed)
 " PROGRESS NOTE    Bethany Wolfe  FMW:[id number] DOB: 07-15-1941 DOA: 10/28/2024 PCP: Iven Lang DASEN, PA-C    Brief Narrative:  84 year old with multiple falls at home for last 3 weeks, progressive confusion brought to the ER by family.  In the emergency room initially patient reported some back pain and losing her balance.  Family reported visual hallucinations. In the emergency room WBC count 12K.  Electrolytes adequate.  Found to have leg infection.  Patient underwent extensive skeletal survey including CT scans of the head and cervical spine followed by MRI of the brain and cervical spine X-rays of the both arms, legs, hips as well CT angiogram of the chest with no acute findings.  Admitted to treat for cellulitis and altered mentation.  Subjective:  Patient seen and examined.  Alert awake and interactive.  Asking about rehab.   Assessment & Plan:   Severe sepsis, met criteria with tachycardia and leukocytosis.  Encephalopathy.  Possible source of infection right lower extremity cellulitis-open wound with history of hematoma. Blood cultures drawn, negative. Treated with Ancef  for 48 hours.  Allergy to penicillin.  Will treat with 2 weeks of oral Keflex  therapy. Aggressive wound care. Patient will need ongoing follow-up at wound care clinic.  Acute encephalopathy likely metabolic.  Mental status improved and normalized. Not on any medication causing drowsiness or encephalopathy. MRI brain and MRI cervical spine, incompletely studies however no obvious abnormalities. TSH, ammonia, UDS negative. B12, adequate Focus on mobility.  Avoid benzodiazepines.  Hypertension: Patient on multiple blood pressure medications.  Her blood pressures are elevated now.  Resume home medications.   Medically stable to transition to SNF or CIR as recommended by therapies.     DVT prophylaxis: enoxaparin  (LOVENOX ) injection 40 mg Start: 10/29/24 1000   Code Status: Full code Family  Communication: None at the bedside.   Disposition Plan: Status is: Inpatient Remains inpatient appropriate because: Medically stable.  Waiting for rehab.     Consultants:  Wound care  Procedures:  None  Antimicrobials:  Ancef  1/5--- 1/8 Keflex  1/8--     Objective: Vitals:   11/04/24 0511 11/04/24 1449 11/04/24 2051 11/05/24 0630  BP: 136/73 (!) 125/57 131/61 120/67  Pulse: 73 66 71 73  Resp:  18 20 20   Temp: 97.9 F (36.6 C) 97.8 F (36.6 C) 98.5 F (36.9 C) 97.6 F (36.4 C)  TempSrc: Oral Oral Oral Oral  SpO2: 95%  95%   Weight:      Height:        Intake/Output Summary (Last 24 hours) at 11/05/2024 1046 Last data filed at 11/05/2024 0640 [place] per 24 hour  Intake 240 ml  Output 1400 ml  Net -1160 ml   Filed Weights   10/29/24 0302 10/29/24 1213  Weight: 107.5 kg 106.6 kg    Examination:  General exam:  Alert awake and interactive today.  Oriented x 4.  Grossly weak. Respiratory system: Clear to auscultation. Respiratory effort normal.  No added sounds. Cardiovascular system: S1 & S2 heard, RRR.  Gastrointestinal system: Abdomen is nondistended, soft and nontender. No organomegaly or masses felt. Normal bowel sounds heard. Central nervous system: Alert and awake.  Pleasant and interactive.  Oriented. Skin:  Right leg with open ulceration with some surrounding redness . No evidence of abscess.  Stage III pressure ulcer bilateral buttocks as in the picture.        Data Reviewed: I have personally reviewed following labs and imaging studies  CBC: Recent Labs  Lab  10/30/24 0545 11/04/24 0544  WBC 7.4 6.8  HGB 10.5* 11.3*  HCT 34.1* 36.8  MCV 84.8 83.8  PLT 250 249   Basic Metabolic Panel: Recent Labs  Lab 10/30/24 0545 11/04/24 0544  NA 140 142  K 3.6 4.3  CL 105 104  CO2 27 29  GLUCOSE 110* 105*  BUN 13 13  CREATININE 0.74 0.64  CALCIUM 9.3 9.6  MG  --  2.4   GFR: Estimated Creatinine Clearance: 64.6 mL/min (by C-G formula  based on SCr of 0.64 mg/dL). Liver Function Tests: Recent Labs  Lab 11/04/24 0544  AST 19  ALT <5  ALKPHOS 78  BILITOT 0.3  PROT 5.9*  ALBUMIN 3.3*   No results for input(s): LIPASE, AMYLASE in the last 168 hours. No results for input(s): AMMONIA in the last 168 hours.  Coagulation Profile: No results for input(s): INR, PROTIME in the last 168 hours. Cardiac Enzymes: No results for input(s): CKTOTAL, CKMB, CKMBINDEX, TROPONINI in the last 168 hours. BNP (last 3 results) Recent Labs    10/29/24 0148  PROBNP 191.0   HbA1C: No results for input(s): HGBA1C in the last 72 hours. CBG: No results for input(s): GLUCAP in the last 168 hours. Lipid Profile: No results for input(s): CHOL, HDL, LDLCALC, TRIG, CHOLHDL, LDLDIRECT in the last 72 hours. Thyroid  Function Tests: No results for input(s): TSH, T4TOTAL, FREET4, T3FREE, THYROIDAB in the last 72 hours.  Anemia Panel: No results for input(s): VITAMINB12, FOLATE, FERRITIN, TIBC, IRON, RETICCTPCT in the last 72 hours.  Sepsis Labs: No results for input(s): PROCALCITON, LATICACIDVEN in the last 168 hours.   Recent Results (from the past 240 hours)  Culture, blood (Routine X 2) w Reflex to ID Panel     Status: None   Collection Time: 10/29/24  8:35 AM   Specimen: BLOOD  Result Value Ref Range Status   Specimen Description   Final    BLOOD LEFT ANTECUBITAL Performed at [facility], [address] [place], [place] [address]    Special Requests   Final    BOTTLES DRAWN AEROBIC AND ANAEROBIC Blood Culture results may not be optimal due to an inadequate volume of blood received in culture bottles Performed at [facility], [address] [place], [place] [address]    Culture   Final    NO GROWTH 5 DAYS Performed at [facility], [address], [place], [place] [address]    Report Status 11/03/2024 FINAL  Final   Culture, blood (Routine X 2) w Reflex to ID Panel     Status: None   Collection Time: 10/29/24  8:40 AM   Specimen: BLOOD LEFT HAND  Result Value Ref Range Status   Specimen Description   Final    BLOOD LEFT HAND Performed at [facility], [address], [place], [place] [address]    Special Requests   Final    BOTTLES DRAWN AEROBIC AND ANAEROBIC Blood Culture adequate volume Performed at [facility], [address] [place], [place] [address]    Culture   Final    NO GROWTH 5 DAYS Performed at [facility], [address], [place], [place] [address]    Report Status 11/03/2024 FINAL  Final         Radiology Studies: No results found.       Scheduled Meds:  aspirin   81 mg Oral BID   cephALEXin   500 mg Oral Q8H  cholecalciferol   2,000 Units Oral Daily   collagenase    Topical Daily   vitamin B-12  1,000 mcg Oral Daily   enoxaparin  (LOVENOX ) injection  40 mg Subcutaneous Q24H   hydrocerin   Topical Daily   loratadine   10 mg Oral Daily   losartan   50 mg Oral Daily   pantoprazole   40 mg Oral Daily   Continuous Infusions:     LOS: 7 days      Renato Applebaum, MD [facility]   "

## 2024-11-05 NOTE — Plan of Care (Signed)

## 2024-11-06 DIAGNOSIS — A419 Sepsis, unspecified organism: Secondary | ICD-10-CM | POA: Diagnosis not present

## 2024-11-06 DIAGNOSIS — L0390 Cellulitis, unspecified: Secondary | ICD-10-CM | POA: Diagnosis not present

## 2024-11-06 NOTE — Progress Notes (Signed)
 Physical Therapy Treatment Patient Details Name: Bethany Wolfe MRN: [id number] DOB: [birth year] Today's Date: 11/06/2024   History of Present Illness 84 yo female presented to ED on 10/28/2024 after 5 falls in 3 weeks. Pt reportedly fell backward and struck her head, unclear of LOC and pt is not currently reporting vision changes or N and V. Pt c/o B hip pain, R  elbow, chest and LBP. Family endorses progressive confusion over the past 3 wks and well as decline with UE motor control and coordination. Pt found to have LE wounds and hematoma and dx with sepsis secondary to R LE cellulitis as well as encephalopathy. All imaging negative for acute injury. Pt PMH includes but is not limited to: HTN, HLD, GERD, asthma, L humeral fx s/p ORIF (2024), lumbar surgery and R ankle surgery.    PT Comments  Pt continues to make good progress. She tolerated multiple transfers, adls, exercises, and ambulation.  Pt requiring min-mod A for mobility with chair follow for safety.  Remains high fall risk. She did express some dizziness with transfers.  Her VSS.  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 Continue to progress as able. Recommend Patient will benefit from continued inpatient follow up therapy, <3 hours/day    If plan is discharge home, recommend the following: A lot of help with walking and/or transfers;A lot of help with bathing/dressing/bathroom;Assistance with cooking/housework;Help with stairs or ramp for entrance   Can travel by private vehicle     No  Equipment Recommendations  Rolling walker (2 wheels)    Recommendations for Other Services       Precautions / Restrictions Precautions Precautions: Fall     Mobility  Bed Mobility Overal bed mobility: Needs Assistance Bed Mobility: Sit to Supine     Supine to sit: Min  assist, Used rails          Transfers Overall transfer level: Needs assistance Equipment used: Rolling walker (2 wheels) Transfers: Sit to/from Stand Sit to Stand: Mod assist, From elevated surface           General transfer comment: STS x 5 during session with cues for hand placement and light mod A to rise.  Pt using BSC to urinate - required assist with adls    Ambulation/Gait Ambulation/Gait assistance: Min assist Gait Distance (Feet): 30 Feet (30' then 20') Assistive device: Rolling walker (2 wheels) Gait Pattern/deviations: Step-to pattern, Decreased stride length, Decreased step length - left, Decreased step length - right Gait velocity: decreased     General Gait Details: Close chair follow, rest breaks, cues for posture, min A for balanc eand RW management   Stairs             Wheelchair Mobility     Tilt Bed    Modified Rankin (Stroke Patients Only)       Balance Overall balance assessment: History of Falls, Needs assistance Sitting-balance support: Feet supported, No upper extremity supported Sitting balance-Leahy Scale: Good     Standing balance support: Bilateral upper extremity supported, During functional activity, Reliant on assistive device for balance Standing balance-Leahy Scale: Poor Standing balance comment: reliant on external support                            Communication    Cognition Arousal: Alert Behavior  During Therapy: WFL for tasks assessed/performed   PT - Cognitive impairments: Problem solving, Initiation                       PT - Cognition Comments: Oriented x 3, cues for safety and transfer technqiues   Following commands impaired: Only follows one step commands consistently    Cueing    Exercises General Exercises - Lower Extremity Ankle Circles/Pumps: AROM, Both, 10 reps, Supine Quad Sets: AROM, Both, 10 reps, Supine Long Arc Quad: AROM, Both, 10 reps, Seated Heel Slides: AROM, Both,  10 reps, Supine    General Comments General comments (skin integrity, edema, etc.): VSS  Pt with some c/o dizziness when shifting from reclined to seated position.  Described as fuzziness around periphery of vision.   -Checked and all VSS including BP stable.   -Visual field testing equal.   -Smooth pursuit intact , but does reports some dizziness looking R -Gaze stabilization, intact but slow, limited head movement and + dizziness -Head Thrust + bil -resting nystagmus and gaze induced both negative  -Per notes pt did have fall striking back of head prior to admission.  Tried to have her return to supine with flat bed.  Pt reports room spinning but also can't breath flat and sat up.  Educated on potential BPPV and trying to test - pt agreeable but then not able to tolerate laying flat for test.    -Educated on slow transitions and focus points to compensate.     Pertinent Vitals/Pain Pain Assessment Pain Assessment: Faces Faces Pain Scale: Hurts little more Pain Location: R arm Pain Descriptors / Indicators: Aching, Discomfort Pain Intervention(s): Limited activity within patient's tolerance, Monitored during session, Repositioned    Home Living                          Prior Function            PT Goals (current goals can now be found in the care plan section) Progress towards PT goals: Progressing toward goals    Frequency    Min 2X/week      PT Plan      Co-evaluation              AM-PAC PT 6 Clicks Mobility   Outcome Measure  Help needed turning from your back to your side while in a flat bed without using bedrails?: A Lot Help needed moving from lying on your back to sitting on the side of a flat bed without using bedrails?: A Lot Help needed moving to and from a bed to a chair (including a wheelchair)?: A Lot Help needed standing up from a chair using your arms (e.g., wheelchair or bedside chair)?: A Lot Help needed to walk in hospital  room?: A Lot Help needed climbing 3-5 steps with a railing? : Total 6 Click Score: 11    End of Session Equipment Utilized During Treatment: Gait belt Activity Tolerance: Patient tolerated treatment well Patient left: in bed;with call bell/phone within reach;with bed alarm set Nurse Communication: Mobility status PT Visit Diagnosis: Unsteadiness on feet (R26.81);Other abnormalities of gait and mobility (R26.89);Repeated falls (R29.6);Muscle weakness (generalized) (M62.81);Difficulty in walking, not elsewhere classified (R26.2) Pain - Right/Left: Right Pain - part of body: Leg     Time: 1450-1537 PT Time Calculation (min) (ACUTE ONLY): 47 min  Charges:    $Gait Training: 8-22 mins $Therapeutic Exercise: 8-22 mins $Therapeutic Activity: 8-22  mins PT General Charges $$ ACUTE PT VISIT: 1 Visit                     Benjiman, PT Acute Rehab Services [facility] Rehab [phone]    Benjiman VEAR Mulberry 11/06/2024, 5:33 PM

## 2024-11-06 NOTE — TOC Progression Note (Addendum)
 Transition of Care [facility]) - Progression Note    Patient Details  Name: Bethany Wolfe MRN: [id number] Date of Birth: 21-Sep-1941  Transition of Care [facility]) CM/SW Contact  Heather DELENA Saltness, LCSW Phone Number: 11/06/2024, 8:56 AM  Clinical Narrative:     ADDENDUM  1659 - Insurance authorization for SNF rehab at [name] has been approved Auth ID: [id number]. Insurance authorization for PTAR has been approved. Auth ID: [id number]. CSW notified Soy in admissions at [facility]. Per Soy, pt can admit to facility tomorrow.  15 - CSW spoke with Jori [phone], at [facility] via phone call. Jori requesting updated PT note from today. PT to revaluate pt today. TOC will continue to follow.  Insurance authorization for short-term SNF rehab and PTAR submitted to [facility]. Awaiting decision. TOC will continue to follow.   Expected Discharge Plan: Skilled Nursing Facility Barriers to Discharge: Continued Medical Work up  Expected Discharge Plan and Services In-house Referral: Clinical Social Work Discharge Planning Services: NA Post Acute Care Choice: NA Living arrangements for the past 2 months: Single Family Home                 DME Arranged: N/A DME Agency: NA       HH Arranged: NA HH Agency: NA         Social Drivers of Health (SDOH) Interventions SDOH Screenings   Food Insecurity: No Food Insecurity (10/29/2024)  Housing: Low Risk (10/29/2024)  Transportation Needs: No Transportation Needs (10/29/2024)  Utilities: Not At Risk (10/29/2024)  Alcohol Screen: Low Risk (03/22/2024)  Depression (PHQ2-9): Medium Risk (10/03/2024)  Financial Resource Strain: Low Risk (03/22/2024)  Physical Activity: Inactive (03/22/2024)  Social Connections: Moderately Isolated (10/29/2024)  Stress: No Stress Concern Present (03/22/2024)  Tobacco Use: Medium Risk (10/28/2024)  Health Literacy: Adequate Health Literacy (03/22/2024)    Readmission Risk Interventions    10/30/2024    2:12 PM  Readmission Risk  Prevention Plan  Post Dischage Appt Complete  Medication Screening Complete  Transportation Screening Complete   Signed: Heather Saltness, MSW, LCSW Clinical Social Worker Inpatient Care Management 11/06/2024 8:57 AM

## 2024-11-06 NOTE — Progress Notes (Signed)
 Occupational Therapy Treatment Patient Details Name: Bethany Wolfe MRN: [id number] DOB: 01-17-1941 Today's Date: 11/06/2024   History of present illness 84 yr old female who presented to the ED secondary to 5 falls in the past 3 weeks, one of which on date of admission 10/28/2024. Pt reportedly fell backward and struck her head, unclear of LOC and pt is not currently reporting vision changes. Pt reported B hip, R  elbow, chest and low back pain. Family endorses progressive confusion over the past 3 weeks and well as decline with UE motor control and coordination. Pt found to have LE wounds and hematoma and with sepsis secondary to R LE cellulitis, as well as encephalopathy. PMH includes but is not limited to: HTN, HLD, GERD, asthma, L humeral fx s/p ORIF (2024), lumbar surgery and R ankle surgery.   OT comments  The pt was seen for functional strengthening, ADL instruction and progression of ADL participation. She required max assist to pull her socks up seated EOB and moderate assistance for toileting management at bedside commode level. The pt put forth good effort and participation, though she does intermittently report a fear of falling. She was assisted to the bedside chair at the end of the session. Continue OT plan of care to address the pt's impaired ADL performance and decreased functional independence. Patient will benefit from intensive inpatient follow-up therapy, >3 hours/day.       If plan is discharge home, recommend the following:  A lot of help with walking and/or transfers;A lot of help with bathing/dressing/bathroom;Help with stairs or ramp for entrance;Assist for transportation;Assistance with cooking/housework   Equipment Recommendations  BSC/3in1    Recommendations for Other Services      Precautions / Restrictions Precautions Precautions: Fall Restrictions Weight Bearing Restrictions Per Provider Order: No       Mobility Bed Mobility Overal bed mobility: Needs  Assistance Bed Mobility: Supine to Sit     Supine to sit: Min assist, Used rails, HOB elevated          Transfers Overall transfer level: Needs assistance Equipment used: Rolling walker (2 wheels) Transfers: Sit to/from Stand, Bed to chair/wheelchair/BSC Sit to Stand: Mod assist, From elevated surface     Step pivot transfers: Min assist     General transfer comment: She required cues for bending her knees back underneath her prior to standing, as well as for pushing up from the bed with one upper extremity & for trunk extension in standing     Balance     Sitting balance-Leahy Scale: Fair       Standing balance-Leahy Scale: Poor         ADL either performed or assessed with clinical judgement   ADL Overall ADL's : Needs assistance/impaired                     Lower Body Dressing: Maximal assistance;Sitting/lateral leans Lower Body Dressing Details (indicate cue type and reason): increased assist needed to pull socks up with pt seated EOB Toilet Transfer: Moderate assistance;Rolling walker (2 wheels);BSC/3in1;Cueing for sequencing Toilet Transfer Details (indicate cue type and reason): The pt was instructed on performing a step-pivot transfer to and from the bedside commode. She required intermittent cues for walker placement, reaching back for toilet rail prior to sitting, and to control descent onto commode. Toileting- Clothing Manipulation and Hygiene: Sit to/from stand;Cueing for compensatory techniques;Cueing for sequencing;Moderate assistance Toileting - Clothing Manipulation Details (indicate cue type and reason): The pt was instructed on toileting  management at bedside commode level. She was unable to void, therefore hygiene management was not performed, however she required min assist for balance in standing and max assist for clothing management.             Communication Communication Communication: No apparent difficulties   Cognition  Arousal: Alert Behavior During Therapy: WFL for tasks assessed/performed               OT - Cognition Comments: Oriented x4                 Following commands: Intact Following commands impaired: Only follows one step commands consistently      Cueing   Cueing Techniques: Verbal cues, Tactile cues             Pertinent Vitals/ Pain       Pain Assessment Pain Assessment: No/denies pain   Frequency  Min 2X/week        Progress Toward Goals  OT Goals(current goals can now be found in the care plan section)     Acute Rehab OT Goals Patient Stated Goal: to get stronger and to go to inpatient rehab OT Goal Formulation: With patient Time For Goal Achievement: 11/14/24 Potential to Achieve Goals: Good  Plan         AM-PAC OT 6 Clicks Daily Activity     Outcome Measure   Help from another person eating meals?: None Help from another person taking care of personal grooming?: A Little Help from another person toileting, which includes using toliet, bedpan, or urinal?: A Lot Help from another person bathing (including washing, rinsing, drying)?: A Lot Help from another person to put on and taking off regular upper body clothing?: A Little Help from another person to put on and taking off regular lower body clothing?: A Lot 6 Click Score: 16    End of Session Equipment Utilized During Treatment: Gait belt;Rolling walker (2 wheels)  OT Visit Diagnosis: Unsteadiness on feet (R26.81);Other abnormalities of gait and mobility (R26.89);Muscle weakness (generalized) (M62.81);History of falling (Z91.81)   Activity Tolerance Patient tolerated treatment well   Patient Left in chair;with call bell/phone within reach   Nurse Communication Mobility status        Time: 8962-8893 OT Time Calculation (min): 29 min  Charges: OT General Charges $OT Visit: 1 Visit OT Treatments $Self Care/Home Management : 8-22 mins $Therapeutic Activity: 8-22 mins    Bethany Wolfe, OTR/L 11/06/2024, 1:04 PM

## 2024-11-06 NOTE — Progress Notes (Signed)
 " PROGRESS NOTE    Bethany Wolfe  FMW:[id number] DOB: September 27, 1941 DOA: 10/28/2024 PCP: Iven Lang DASEN, PA-C    Brief Narrative:  84 year old with multiple falls at home for last 3 weeks, progressive confusion brought to the ER by family.  In the emergency room initially patient reported some back pain and losing her balance.  Family reported visual hallucinations. In the emergency room WBC count 12K.  Electrolytes adequate.  Found to have leg infection.  Patient underwent extensive skeletal survey including CT scans of the head and cervical spine followed by MRI of the brain and cervical spine X-rays of the both arms, legs, hips as well CT angiogram of the chest with no acute findings.  Admitted to treat for cellulitis and altered mentation.  Subjective:  No new events.  CIR declined by insurance.  Waiting for SNF authorization.   Assessment & Plan:   Severe sepsis, met criteria with tachycardia and leukocytosis.  Encephalopathy.  Possible source of infection right lower extremity cellulitis-open wound with history of hematoma. Blood cultures drawn, negative. Treated with Ancef  for 48 hours.  Allergy to penicillin.  Will treat with 2 weeks of oral Keflex  therapy. Aggressive wound care. Patient will need ongoing follow-up at wound care clinic.  Acute encephalopathy likely metabolic.  Mental status improved and normalized. Not on any medication causing drowsiness or encephalopathy. MRI brain and MRI cervical spine, incompletely studies however no obvious abnormalities. TSH, ammonia, UDS negative. B12, adequate Focus on mobility.  Avoid benzodiazepines.  Hypertension: Patient on multiple blood pressure medications.  Her blood pressures are elevated now.  Resume home medications.   Medically stable to transition to SNF or CIR as recommended by therapies.     DVT prophylaxis: enoxaparin  (LOVENOX ) injection 40 mg Start: 10/29/24 1000   Code Status: Full code Family  Communication: None at the bedside.   Disposition Plan: Status is: Inpatient Remains inpatient appropriate because: Medically stable.  Waiting for SNF bed.     Consultants:  Wound care  Procedures:  None  Antimicrobials:  Ancef  1/5--- 1/8 Keflex  1/8--     Objective: Vitals:   11/04/24 2051 11/05/24 0630 11/05/24 2011 11/06/24 0518  BP: 131/61 120/67 (!) 106/57 123/65  Pulse: 71 73 64 79  Resp: 20 20 18 17   Temp: 98.5 F (36.9 C) 97.6 F (36.4 C) 97.9 F (36.6 C) 98.7 F (37.1 C)  TempSrc: Oral Oral Oral Oral  SpO2: 95%  97% 93%  Weight:      Height:        Intake/Output Summary (Last 24 hours) at 11/06/2024 1034 Last data filed at 11/06/2024 0931 Gross per 24 hour  Intake 360 ml  Output 1300 ml  Net -940 ml   Filed Weights   10/29/24 0302 10/29/24 1213  Weight: 107.5 kg 106.6 kg    Examination:  General exam:  Alert awake and interactive today.  Oriented x 4.  Grossly weak. Respiratory system: Clear to auscultation. Respiratory effort normal.  No added sounds. Cardiovascular system: S1 & S2 heard, RRR.  Right leg with open ulceration.  Skin changes present.  No evidence of abscess.  Stage III pressure ulcer bilateral buttocks as in the picture.        Data Reviewed: I have personally reviewed following labs and imaging studies  CBC: Recent Labs  Lab 11/04/24 0544  WBC 6.8  HGB 11.3*  HCT 36.8  MCV 83.8  PLT 249   Basic Metabolic Panel: Recent Labs  Lab 11/04/24 0544  NA  142  K 4.3  CL 104  CO2 29  GLUCOSE 105*  BUN 13  CREATININE 0.64  CALCIUM 9.6  MG 2.4   GFR: Estimated Creatinine Clearance: 64.6 mL/min (by C-G formula based on SCr of 0.64 mg/dL). Liver Function Tests: Recent Labs  Lab 11/04/24 0544  AST 19  ALT <5  ALKPHOS 78  BILITOT 0.3  PROT 5.9*  ALBUMIN 3.3*   No results for input(s): LIPASE, AMYLASE in the last 168 hours. No results for input(s): AMMONIA in the last 168 hours.  Coagulation  Profile: No results for input(s): INR, PROTIME in the last 168 hours. Cardiac Enzymes: No results for input(s): CKTOTAL, CKMB, CKMBINDEX, TROPONINI in the last 168 hours. BNP (last 3 results) Recent Labs    10/29/24 0148  PROBNP 191.0   HbA1C: No results for input(s): HGBA1C in the last 72 hours. CBG: No results for input(s): GLUCAP in the last 168 hours. Lipid Profile: No results for input(s): CHOL, HDL, LDLCALC, TRIG, CHOLHDL, LDLDIRECT in the last 72 hours. Thyroid  Function Tests: No results for input(s): TSH, T4TOTAL, FREET4, T3FREE, THYROIDAB in the last 72 hours.  Anemia Panel: No results for input(s): VITAMINB12, FOLATE, FERRITIN, TIBC, IRON, RETICCTPCT in the last 72 hours.  Sepsis Labs: No results for input(s): PROCALCITON, LATICACIDVEN in the last 168 hours.   Recent Results (from the past 240 hours)  Culture, blood (Routine X 2) w Reflex to ID Panel     Status: None   Collection Time: 10/29/24  8:35 AM   Specimen: BLOOD  Result Value Ref Range Status   Specimen Description   Final    BLOOD LEFT ANTECUBITAL Performed at [facility], [address], [place], [place] [address]    Special Requests   Final    BOTTLES DRAWN AEROBIC AND ANAEROBIC Blood Culture results may not be optimal due to an inadequate volume of blood received in culture bottles Performed at [facility], [address], [place], [place] [address]    Culture   Final    NO GROWTH 5 DAYS Performed at [facility], [address], [place], [place] [address]    Report Status 11/03/2024 FINAL  Final  Culture, blood (Routine X 2) w Reflex to ID Panel     Status: None   Collection Time: 10/29/24  8:40 AM   Specimen: BLOOD LEFT HAND  Result Value Ref Range Status   Specimen Description   Final    BLOOD LEFT HAND Performed at [facility], [address] [place], [place] [address]    Special  Requests   Final    BOTTLES DRAWN AEROBIC AND ANAEROBIC Blood Culture adequate volume Performed at [facility], [address] [place], [place] [address]    Culture   Final    NO GROWTH 5 DAYS Performed at [facility], [address] [place], [place] [address]    Report Status 11/03/2024 FINAL  Final         Radiology Studies: No results found.       Scheduled Meds:  aspirin   81 mg Oral BID   cephALEXin   500 mg Oral Q8H   cholecalciferol   2,000 Units Oral Daily   collagenase    Topical Daily   vitamin B-12  1,000 mcg Oral Daily   enoxaparin  (LOVENOX ) injection  40 mg Subcutaneous Q24H   hydrocerin   Topical Daily   loratadine   10 mg Oral Daily   losartan   50 mg Oral Daily   pantoprazole   40 mg Oral Daily   Continuous Infusions:     LOS: 8 days      Renato Applebaum, MD [facility]   "

## 2024-11-06 NOTE — Plan of Care (Signed)
   Problem: Clinical Measurements: Goal: Diagnostic test results will improve Outcome: Progressing Goal: Respiratory complications will improve Outcome: Progressing Goal: Cardiovascular complication will be avoided Outcome: Progressing   Problem: Activity: Goal: Risk for activity intolerance will decrease Outcome: Progressing   Problem: Nutrition: Goal: Adequate nutrition will be maintained Outcome: Progressing

## 2024-11-07 MED ORDER — LORATADINE 10 MG PO TABS: 10.0000 mg | ORAL_TABLET | Freq: Every day | ORAL | Status: AC

## 2024-11-07 MED ORDER — CEPHALEXIN 500 MG PO CAPS: 500.0000 mg | ORAL_CAPSULE | Freq: Three times a day (TID) | ORAL | Status: AC

## 2024-11-07 NOTE — Care Management Important Message (Signed)
 Important Message  Patient Details IM Letter given. Name: Bethany Wolfe MRN: [id number] Date of Birth: 01-Sep-1941   Important Message Given:  Yes - Medicare IM     Melba Ates 11/07/2024, 8:52 AM

## 2024-11-07 NOTE — Plan of Care (Signed)

## 2024-11-07 NOTE — Progress Notes (Signed)
 Report is given to [employer] in [place].

## 2024-11-07 NOTE — TOC Transition Note (Signed)
 Transition of Care [facility]) - Discharge Note   Patient Details  Name: Bethany Wolfe MRN: [id number] Date of Birth: [birth year]  Transition of Care [facility] CM/SW Contact:  Jon ONEIDA Anon, RN Phone Number: 11/07/2024, 10:58 AM   Clinical Narrative:    Pt will discharge to [name] for [facility]. Pt and pt spouse aware and agreeable to the discharge plan. DC packet placed at RN station. PTAR called at [phone]. Floor RN given number to call report to [phone] to room [address]. There are no other ICM needs identified. ICM will sign off.   Final next level of care: Skilled Nursing Facility Barriers to Discharge: Barriers Resolved   Patient Goals and CMS Choice Patient states their goals for this hospitalization and ongoing recovery are:: To go to STR and then return home with spouse CMS Medicare.gov Compare Post Acute Care list provided to:: Patient Choice offered to / list presented to : Patient [place] ownership interest in [facility].provided to:: Patient    Discharge Placement              Patient chooses bed at: [name] Patient to be transferred to facility by: PTAR Name of family member notified: Bethany Wolfe,Bethany Wolfe  Spouse, Emergency Contact  [phone] Patient and family notified of of transfer: 11/07/24  Discharge Plan and Services Additional resources added to the After Visit Summary for   In-house Referral: Clinical Social Work Discharge Planning Services: NA Post Acute Care Choice: NA          DME Arranged: N/A DME Agency: NA       HH Arranged: NA HH Agency: NA        Social Drivers of Health (SDOH) Interventions SDOH Screenings   Food Insecurity: No Food Insecurity (10/29/2024)  Housing: Low Risk (10/29/2024)  Transportation Needs: No Transportation Needs (10/29/2024)  Utilities: Not At Risk (10/29/2024)  Alcohol Screen: Low Risk (03/22/2024)  Depression (PHQ2-9): Medium Risk (10/03/2024)  Financial Resource Strain: Low Risk  (03/22/2024)  Physical Activity: Inactive (03/22/2024)  Social Connections: Moderately Isolated (10/29/2024)  Stress: No Stress Concern Present (03/22/2024)  Tobacco Use: Medium Risk (10/28/2024)  Health Literacy: Adequate Health Literacy (03/22/2024)     Readmission Risk Interventions    10/30/2024    2:12 PM  Readmission Risk Prevention Plan  Post Dischage Appt Complete  Medication Screening Complete  Transportation Screening Complete

## 2024-11-07 NOTE — Discharge Summary (Signed)
 Physician Discharge Summary  Bethany Wolfe FMW:[id number] DOB: [birth year] DOA: 10/28/2024  PCP: Iven Lang DASEN, PA-C  Admit date: 10/28/2024 Discharge date: 11/07/2024  Admitted From: Home Disposition: Skilled nursing facility  Recommendations for Outpatient Follow-up:  Follow up with PCP in 1-2 weeks Continue wound care  Home Health: N/A Equipment/Devices: N/A  Discharge Condition: Stable CODE STATUS: Full code Diet recommendation: Low-salt diet  Discharge summary: 84 year old with multiple falls at home for last 3 weeks, progressive confusion brought to the ER by family.  In the emergency room initially patient reported some back pain and losing her balance.  Family reported visual hallucinations. In the emergency room WBC count 12K.  Electrolytes adequate.  Found to have leg infection.  Patient underwent extensive skeletal survey including CT scans of the head and cervical spine followed by MRI of the brain and cervical spine X-rays of the both arms, legs, hips as well CT angiogram of the chest with no acute findings.  Admitted to treat for cellulitis and altered mentation. Her mental status improved.  Treated with IV antibiotics then subsequently with oral antibiotics.  Local wound care.  Clinically improved but remains very debilitated.  Needs physical therapy rehab as well as wound care.  Severe sepsis present on admission.source of infection right lower extremity cellulitis-open wound with history of hematoma. Blood cultures, negative. Treated with Ancef  for 48 hours.  Allergy to penicillin.  Will treat with 2 weeks of oral Keflex  therapy. Aggressive wound care. Patient will need ongoing follow-up at wound care clinic. Wound care instructions attached.   Acute encephalopathy likely metabolic.  Mental status improved and normalized. Not on any medication causing drowsiness or encephalopathy. MRI brain and MRI cervical spine, incompletely studies however no obvious  abnormalities. TSH, ammonia, UDS negative. B12, adequate Focus on mobility.     Hypertension: Patient on multiple blood pressure medications.  Her blood pressures are elevated now.  Resume home medications.    Stable to transition to a skilled level of care.  Discharge Diagnoses:  Principal Problem:   Sepsis due to cellulitis [facility])    Discharge Instructions  Discharge Instructions     Diet - low sodium heart healthy   Complete by: As directed    Discharge wound care:   Complete by: As directed    Wound care  Daily      Comments: Dressing procedure/placement/frequency: 1.Cleanse proximal RLE wound with Vashe, apply moist Vashe gauze to wound bed, top with ABD pad, secure with kerlix. Frederik Drucilla [phone]). 2.Eucerin to the RLE daily, not between toes 3.Paint distal RLE wound with betadine, allow to air dry. Cover with dry dressing.    Wound care  Daily      Comments: Cleanse sacral and buttocks wounds with Vashe, do not rinse.  Apply 1/4 thick layer of Santyl  to wound beds daily, cover with saline moist gauze, dry gauze and silicone foam or ABD pad and clothe tape.   Increase activity slowly   Complete by: As directed       Allergies as of 11/07/2024       Reactions   Alendronate Sodium Other (See Comments)   GI Upset (intolerance)   Codeine Nausea And Vomiting   SICK ON STOMACH   Lovastatin Nausea Only   Penicillin G Hives   Big welts Tolerates Ancef    Pravastatin Other (See Comments)    Myalgias (intolerance)   Tetanus Toxoid Hives   Tetanus Toxoid, Adsorbed Hives   Penicillins Rash   Tetanus Toxoid-containing Vaccines Rash  Medication List     STOP taking these medications    aspirin  81 MG chewable tablet       TAKE these medications    acetaminophen  650 MG CR tablet Commonly known as: TYLENOL  Take 650 mg by mouth every 8 (eight) hours as needed for pain.   albuterol  108 (90 Base) MCG/ACT inhaler Commonly known as: VENTOLIN   HFA TAKE 2 PUFFS BY MOUTH EVERY 6 HOURS AS NEEDED FOR WHEEZE OR SHORTNESS OF BREATH What changed: Another medication with the same name was removed. Continue taking this medication, and follow the directions you see here.   cephALEXin  500 MG capsule Commonly known as: KEFLEX  Take 1 capsule (500 mg total) by mouth 3 (three) times daily for 4 days.   fluticasone  50 MCG/ACT nasal spray Commonly known as: FLONASE  Place 2 sprays into both nostrils daily.   indapamide  1.25 MG tablet Commonly known as: LOZOL  TAKE 1 TABLET BY MOUTH EVERY DAY   loratadine  10 MG tablet Commonly known as: CLARITIN  Take 1 tablet (10 mg total) by mouth daily.   losartan  50 MG tablet Commonly known as: COZAAR  Take 1 tablet (50 mg total) by mouth daily.   mupirocin  ointment 2 % Commonly known as: BACTROBAN  Apply topically daily. Apply to area on buttock.   pantoprazole  40 MG tablet Commonly known as: PROTONIX  Take 1 tablet (40 mg total) by mouth daily.   triamcinolone  ointment 0.5 % Commonly known as: KENALOG  Apply 1 Application topically 2 (two) times daily. Use for only one week at a time before taking one week off.   urea  40 % Crea Commonly known as: CARMOL Apply topically to legs 2 (two) times daily for 1-2 weeks   Vitamin D3 50 MCG (2000 UT) capsule Take 1 capsule (2,000 Units total) by mouth daily.               Discharge Care Instructions  (From admission, onward)           Start     Ordered   11/07/24 0000  Discharge wound care:       Comments: Wound care  Daily      Comments: Dressing procedure/placement/frequency: 1.Cleanse proximal RLE wound with Vashe, apply moist Vashe gauze to wound bed, top with ABD pad, secure with kerlix. Frederik Drucilla [phone]). 2.Eucerin to the RLE daily, not between toes 3.Paint distal RLE wound with betadine, allow to air dry. Cover with dry dressing.    Wound care  Daily      Comments: Cleanse sacral and buttocks wounds with Vashe, do not  rinse.  Apply 1/4 thick layer of Santyl  to wound beds daily, cover with saline moist gauze, dry gauze and silicone foam or ABD pad and clothe tape.   11/07/24 0729            Contact information for after-discharge care     Destination     [name] .   Service: Skilled Nursing Contact information: [name] [place]  [phone]             Home Medical Care     [facility] .   Service: Home Health Services Contact information: [address] [place]  [phone]                    Allergies[1]  Consultations: None   Procedures/Studies: MR Cervical Spine Wo Contrast Result Date: 10/29/2024 EXAM: MRI CERVICAL SPINE WITHOUT CONTRAST  10/29/2024 03:23:09 PM TECHNIQUE: Multiplanar multisequence MRI of the cervical spine was performed. COMPARISON: CT cervical spine 10/28/2024. CLINICAL HISTORY: neuro deficit, R hand weakness FINDINGS: LIMITATIONS/ARTIFACTS: The examination is severely motion degraded. BONES AND ALIGNMENT: Cervical spine straightening. Preserved vertebral body heights. Multilevel disc space narrowing and chronic degenerative endplate changes. SPINAL CORD: Nondiagnostic assessment of the spinal cord due to motion. SOFT TISSUES: No paraspinal mass. DISC LEVELS: Detailed assessment of degenerative changes and resultant stenoses is precluded by the degree of motion artifact. There is no evidence of high grade spinal canal stenosis on the sagittal T2 sequence. IMPRESSION: 1. Severely motion degraded, largely nondiagnostic examination as described above. Electronically signed by: Dasie Hamburg MD 10/29/2024 03:54 PM EST RP Workstation: [id number]   MR BRAIN WO CONTRAST Result Date: 10/29/2024 EXAM: MRI BRAIN WITHOUT CONTRAST 10/29/2024 03:23:09 PM TECHNIQUE: Multiplanar multisequence MRI of the head/brain was performed without the administration of intravenous  contrast. COMPARISON: CT head 10/28/2024. CLINICAL HISTORY: Neuro deficit, acute, stroke suspected. FINDINGS: LIMITATIONS: The examination is moderately motion degraded. BRAIN AND VENTRICLES: There is no evidence of an acute infarct, intracranial hemorrhage, mass, midline shift, hydrocephalus, or extra-axial fluid collection. Mild cerebral atrophy is within normal limits for age. T2 hyperintensities in the cerebral white matter are nonspecific but compatible with mild chronic small vessel ischemic disease, also considered to be within normal limits for age. An enlarged, partially empty sella is noted. Major intracranial vascular flow voids are preserved. ORBITS: No acute abnormality. SINUSES AND MASTOIDS: No acute abnormality. BONES AND SOFT TISSUES: Normal marrow signal. No acute soft tissue abnormality. IMPRESSION: 1. No acute intracranial abnormality identified on this motion degraded examination. Electronically signed by: Dasie Hamburg MD 10/29/2024 03:50 PM EST RP Workstation: [id number]   CT Angio Chest PE W/Cm &/Or Wo Cm Result Date: 10/29/2024 EXAM: CTA CHEST 10/29/2024 03:46:01 AM TECHNIQUE: CTA of the chest was performed without and with the administration of 75 mL of iohexol  (OMNIPAQUE ) 350 MG/ML injection. Multiplanar reformatted images are provided for review. MIP images are provided for review. Automated exposure control, iterative reconstruction, and/or weight based adjustment of the mA/kV was utilized to reduce the radiation dose to as low as reasonably achievable. COMPARISON: 07/08/2023 CLINICAL HISTORY: Pulmonary embolism (PE) suspected, high prob. FINDINGS: PULMONARY ARTERIES: Pulmonary arteries are adequately opacified for evaluation. No acute pulmonary embolus. Main pulmonary artery is normal in caliber. MEDIASTINUM: The heart and pericardium demonstrate no acute abnormality. Coronary artery and aortic atherosclerosis. There is no acute abnormality of the thoracic aorta. LYMPH NODES: No  mediastinal, hilar or axillary lymphadenopathy. LUNGS AND PLEURA: 11 mm left upper lobe pulmonary nodule on image 49. No focal consolidation or pulmonary edema. No evidence of pleural effusion or pneumothorax. UPPER ABDOMEN: Small hiatal hernia. Limited images of the upper abdomen are otherwise unremarkable. SOFT TISSUES AND BONES: No acute bone or soft tissue abnormality. IMPRESSION: 1. No pulmonary embolism. 2. No acute cardiopulmonary disease. 3. Coronary artery disease, aortic atherosclerosis. 4. Small hiatal hernia. 5. 11 mm left upper lobe pulmonary nodule. This is stable since prior study. This is only minimally changed since 2014 . Findings compatible with the benign lesion. Electronically signed by: Franky Crease MD 10/29/2024 04:00 AM EST RP Workstation: [id number]   DG Humerus Right Result Date: 10/29/2024 EXAM: 1 VIEW(S) XRAY OF THE RIGHT HUMERUS 10/29/2024 12:28:59 AM COMPARISON: None available. CLINICAL HISTORY: Pain after unremarkable fall. FINDINGS: BONES AND JOINTS: Degenerative changes in the glenohumeral joint. 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 Degenerative changes in the elbow.  No acute fracture or dislocation. No malalignment. SOFT TISSUES: The soft tissues are unremarkable. IMPRESSION: 1. No acute fracture or dislocation. Electronically signed by: Elsie Gravely MD 10/29/2024 12:36 AM EST RP Workstation: [id number]   DG Hand Complete Right Result Date: 10/29/2024 EXAM: 3 OR MORE VIEW(S) XRAY OF THE RIGHT HAND 10/29/2024 12:28:59 AM COMPARISON: None available. CLINICAL HISTORY: Pain after unremarkable fall. FINDINGS: BONES AND JOINTS: No evidence of acute fracture or dislocation. Degenerative changes are present in the interphalangeal joints, first metacarpophalangeal and first carpometacarpal joints, radiocarpal joints, and STT joints. Old and united ossicles are noted over the ulnar styloid process. Calcification is present in the triangular  fibrocartilage. Subcortical cysts are seen in the distal ulna and trapezium, consistent with degenerative cysts. SOFT TISSUES: The soft tissues are unremarkable. IMPRESSION: 1. No acute fracture or dislocation. Electronically signed by: Elsie Gravely MD 10/29/2024 12:35 AM EST RP Workstation: [id number]   DG Femur Min 2 Views Left Result Date: 10/29/2024 EXAM: 2 VIEW(S) XRAY OF THE LEFT FEMUR 10/29/2024 12:28:59 AM COMPARISON: None available. CLINICAL HISTORY: Pain after unremarkable fall. FINDINGS: BONES AND JOINTS: No acute bony abnormalities. Mild degenerative changes in the hip and knee. No malalignment. SOFT TISSUES: The soft tissues are unremarkable. IMPRESSION: 1. No acute bony abnormalities. 2. Mild degenerative changes in the hip and knee. Electronically signed by: Elsie Gravely MD 10/29/2024 12:34 AM EST RP Workstation: [id number]   DG Forearm Right Result Date: 10/29/2024 EXAM: VIEW(S) XRAY OF THE RIGHT FOREARM 10/29/2024 12:28:59 AM COMPARISON: None available. CLINICAL HISTORY: FALL. Pain after unremarkable fall. FINDINGS: BONES AND JOINTS: No evidence of acute fracture or dislocation. No malalignment. Degenerative changes in the elbow and wrist. Subcortical cysts in the distal ulna are likely degenerative. Old ununited ossicle over the ulnar styloid process. SOFT TISSUES: The soft tissues are unremarkable. IMPRESSION: 1. No acute fracture or dislocation. Electronically signed by: Elsie Gravely MD 10/29/2024 12:33 AM EST RP Workstation: [id number]   CT Head Wo Contrast Result Date: 10/28/2024 EXAM: CT HEAD AND CERVICAL SPINE 10/28/2024 11:06:09 PM TECHNIQUE: CT of the head and cervical spine was performed without the administration of intravenous contrast. Multiplanar reformatted images are provided for review. Automated exposure control, iterative reconstruction, and/or weight based adjustment of the mA/kV was utilized to reduce the radiation dose to as low as reasonably achievable.  COMPARISON: Cervical spine CT and head CT, both 07/08/2023. CLINICAL HISTORY: The patient fell and hit her head. FINDINGS: CT HEAD BRAIN AND VENTRICLES: There is mild global atrophy, mild atrophic ventriculomegaly and small vessel disease in the cerebral white matter, unchanged. There is calcification in the carotid siphons and distal vertebral arteries. No hyperdense vessel is evident. No acute intracranial hemorrhage. No mass effect or midline shift. No abnormal extra-axial fluid collection. No evidence of acute infarct. No hydrocephalus. ORBITS: No acute abnormality. SINUSES AND MASTOIDS: No acute abnormality. SOFT TISSUES AND SKULL: There is osteopenia without evidence of fractures or focal pathologic process. No acute skull fracture. No acute soft tissue abnormality. CT CERVICAL SPINE BONES AND ALIGNMENT: There is a chronic straightened cervical lordosis. Minimal grade 1 anterolisthesis related to facet hypertrophy again noted C3-C4 and C7-T1. This was seen previously. No other listhesis is seen. No traumatic listhesis. Bone on bone anterior atlantoaxial joint joint space loss is chronically noted with retroodontoid calcific pannus and with reactive osteophytes. There is osteopenia without evidence of fractures or focal pathologic process. No acute fracture or traumatic malalignment. DEGENERATIVE CHANGES: The discs are diffusely degenerated, with disc space calcification and bidirectional  osteophytes. Dorsal endplate osteophytes at C5-C6 again cause a mild compression of the ventral cord, other levels with lesser encroachment of the ventral cord surface by dorsal osteophytes. Facet joint and uncinate spurring is seen causing moderate bilateral foraminal stenosis at C3-C4, moderate to severe left foraminal stenosis at C4-C5, moderate to severe bilateral foraminal stenosis at C5-C6 and mild foraminal stenosis at C6-C7. SOFT TISSUES: No prevertebral soft tissue swelling. No thyroid  or laryngeal mass is seen. There  is calcific plaque at the carotid bifurcations. IMPRESSION: 1. No acute intracranial CT findings or depressed skull fractures. 2. No acute fracture or traumatic malalignment of the cervical spine. 3. Osteopenia With multilevel degenerative changes. Electronically signed by: Francis Quam MD 10/28/2024 11:31 PM EST RP Workstation: [id number]   CT Cervical Spine Wo Contrast Result Date: 10/28/2024 EXAM: CT HEAD AND CERVICAL SPINE 10/28/2024 11:06:09 PM TECHNIQUE: CT of the head and cervical spine was performed without the administration of intravenous contrast. Multiplanar reformatted images are provided for review. Automated exposure control, iterative reconstruction, and/or weight based adjustment of the mA/kV was utilized to reduce the radiation dose to as low as reasonably achievable. COMPARISON: Cervical spine CT and head CT, both 07/08/2023. CLINICAL HISTORY: The patient fell and hit her head. FINDINGS: CT HEAD BRAIN AND VENTRICLES: There is mild global atrophy, mild atrophic ventriculomegaly and small vessel disease in the cerebral white matter, unchanged. There is calcification in the carotid siphons and distal vertebral arteries. No hyperdense vessel is evident. No acute intracranial hemorrhage. No mass effect or midline shift. No abnormal extra-axial fluid collection. No evidence of acute infarct. No hydrocephalus. ORBITS: No acute abnormality. SINUSES AND MASTOIDS: No acute abnormality. SOFT TISSUES AND SKULL: There is osteopenia without evidence of fractures or focal pathologic process. No acute skull fracture. No acute soft tissue abnormality. CT CERVICAL SPINE BONES AND ALIGNMENT: There is a chronic straightened cervical lordosis. Minimal grade 1 anterolisthesis related to facet hypertrophy again noted C3-C4 and C7-T1. This was seen previously. No other listhesis is seen. No traumatic listhesis. Bone on bone anterior atlantoaxial joint joint space loss is chronically noted with retroodontoid calcific  pannus and with reactive osteophytes. There is osteopenia without evidence of fractures or focal pathologic process. No acute fracture or traumatic malalignment. DEGENERATIVE CHANGES: The discs are diffusely degenerated, with disc space calcification and bidirectional osteophytes. Dorsal endplate osteophytes at C5-C6 again cause a mild compression of the ventral cord, other levels with lesser encroachment of the ventral cord surface by dorsal osteophytes. Facet joint and uncinate spurring is seen causing moderate bilateral foraminal stenosis at C3-C4, moderate to severe left foraminal stenosis at C4-C5, moderate to severe bilateral foraminal stenosis at C5-C6 and mild foraminal stenosis at C6-C7. SOFT TISSUES: No prevertebral soft tissue swelling. No thyroid  or laryngeal mass is seen. There is calcific plaque at the carotid bifurcations. IMPRESSION: 1. No acute intracranial CT findings or depressed skull fractures. 2. No acute fracture or traumatic malalignment of the cervical spine. 3. Osteopenia With multilevel degenerative changes. Electronically signed by: Francis Quam MD 10/28/2024 11:31 PM EST RP Workstation: [id number]   DG Hip Unilat W or Wo Pelvis 2-3 Views Right Result Date: 10/28/2024 CLINICAL DATA:  Unwitnessed fall EXAM: DG HIP (WITH OR WITHOUT PELVIS) 2-3V RIGHT COMPARISON:  01/29/2010 report FINDINGS: Limited by soft tissue artifact and habitus. SI joints are non widened. Pubic symphysis and rami appear intact. No definitive fracture or malalignment. Moderate hip degenerative changes. IMPRESSION: Limited by soft tissue artifact and habitus. No definitive acute osseous abnormality. Cross-sectional  imaging follow-up if persistent concern for fracture Electronically Signed   By: Luke Bun M.D.   On: 10/28/2024 23:03   DG Chest 2 View Result Date: 10/28/2024 CLINICAL DATA:  Unwitnessed fall EXAM: CHEST - 2 VIEW COMPARISON:  CT 07/08/2023 FINDINGS: No acute airspace disease or pleural effusion.  Normal cardiomediastinal silhouette with aortic atherosclerosis. No pneumothorax. CT demonstrated left-sided pulmonary nodule not well seen by radiography. IMPRESSION: No active cardiopulmonary disease. Electronically Signed   By: Luke Bun M.D.   On: 10/28/2024 23:02   (Echo, Carotid, EGD, Colonoscopy, ERCP)    Subjective: Patient seen in the morning rounds.  Denies any complaints.  No new events.  Sometimes uses Tylenol  for the leg pain.   Discharge Exam: Vitals:   11/06/24 1319 11/06/24 2031  BP: 124/74 (!) 141/59  Pulse: 77 79  Resp: 18 18  Temp: 98.1 F (36.7 C) 98.2 F (36.8 C)  SpO2: 96% 94%   Vitals:   11/05/24 2011 11/06/24 0518 11/06/24 1319 11/06/24 2031  BP: (!) 106/57 123/65 124/74 (!) 141/59  Pulse: 64 79 77 79  Resp: 18 17 18 18   Temp: 97.9 F (36.6 C) 98.7 F (37.1 C) 98.1 F (36.7 C) 98.2 F (36.8 C)  TempSrc: Oral Oral Oral Oral  SpO2: 97% 93% 96% 94%  Weight:      Height:        General: Pt is alert, awake, not in acute distress Cardiovascular: RRR, S1/S2 +, no rubs, no gallops Respiratory: CTA bilaterally, no wheezing, no rhonchi Abdominal: Soft, NT, ND, bowel sounds + Extremities: slight edema right leg, open wound that is clean without any secretions , chronic skin changes and induration       The results of significant diagnostics from this hospitalization (including imaging, microbiology, ancillary and laboratory) are listed below for reference.     Microbiology: Recent Results (from the past 240 hours)  Culture, blood (Routine X 2) w Reflex to ID Panel     Status: None   Collection Time: 10/29/24  8:35 AM   Specimen: BLOOD  Result Value Ref Range Status   Specimen Description   Final    BLOOD LEFT ANTECUBITAL Performed at [facility], [address], [place], [place] [address]    Special Requests   Final    BOTTLES DRAWN AEROBIC AND ANAEROBIC Blood Culture results may not be optimal due to an inadequate volume  of blood received in culture bottles Performed at [facility], [address], [place], [place] [address]    Culture   Final    NO GROWTH 5 DAYS Performed at [facility], [address], [place], [place] [address]    Report Status 11/03/2024 FINAL  Final  Culture, blood (Routine X 2) w Reflex to ID Panel     Status: None   Collection Time: 10/29/24  8:40 AM   Specimen: BLOOD LEFT HAND  Result Value Ref Range Status   Specimen Description   Final    BLOOD LEFT HAND Performed at [facility], [address], [place], [place] [address]    Special Requests   Final    BOTTLES DRAWN AEROBIC AND ANAEROBIC Blood Culture adequate volume Performed at [facility], [address] [place], [place] [address]    Culture   Final    NO GROWTH 5 DAYS Performed at [facility], [address] [place] [address], [place], [place] [address]    Report Status 11/03/2024  FINAL  Final     Labs: BNP (last 3 results) No results for input(s): BNP in the last 8760 hours. Basic Metabolic Panel: Recent Labs  Lab 11/04/24 0544  NA 142  K 4.3  CL 104  CO2 29  GLUCOSE 105*  BUN 13  CREATININE 0.64  CALCIUM 9.6  MG 2.4   Liver Function Tests: Recent Labs  Lab 11/04/24 0544  AST 19  ALT <5  ALKPHOS 78  BILITOT 0.3  PROT 5.9*  ALBUMIN 3.3*   No results for input(s): LIPASE, AMYLASE in the last 168 hours. No results for input(s): AMMONIA in the last 168 hours. CBC: Recent Labs  Lab 11/04/24 0544  WBC 6.8  HGB 11.3*  HCT 36.8  MCV 83.8  PLT 249   Cardiac Enzymes: No results for input(s): CKTOTAL, CKMB, CKMBINDEX, TROPONINI in the last 168 hours. BNP: Invalid input(s): POCBNP CBG: No results for input(s): GLUCAP in the last 168 hours. D-Dimer No results for input(s): DDIMER in the last 72 hours. Hgb A1c No results for input(s): HGBA1C in the last 72 hours. Lipid Profile No results for input(s): CHOL, HDL,  LDLCALC, TRIG, CHOLHDL, LDLDIRECT in the last 72 hours. Thyroid  function studies No results for input(s): TSH, T4TOTAL, T3FREE, THYROIDAB in the last 72 hours.  Invalid input(s): FREET3 Anemia work up No results for input(s): VITAMINB12, FOLATE, FERRITIN, TIBC, IRON, RETICCTPCT in the last 72 hours. Urinalysis    Component Value Date/Time   COLORURINE YELLOW 10/29/2024 0105   APPEARANCEUR CLEAR 10/29/2024 0105   LABSPEC 1.028 10/29/2024 0105   PHURINE 5.0 10/29/2024 0105   GLUCOSEU NEGATIVE 10/29/2024 0105   HGBUR NEGATIVE 10/29/2024 0105   BILIRUBINUR NEGATIVE 10/29/2024 0105   KETONESUR NEGATIVE 10/29/2024 0105   PROTEINUR NEGATIVE 10/29/2024 0105   NITRITE NEGATIVE 10/29/2024 0105   LEUKOCYTESUR NEGATIVE 10/29/2024 0105   Sepsis Labs Recent Labs  Lab 11/04/24 0544  WBC 6.8   Microbiology Recent Results (from the past 240 hours)  Culture, blood (Routine X 2) w Reflex to ID Panel     Status: None   Collection Time: 10/29/24  8:35 AM   Specimen: BLOOD  Result Value Ref Range Status   Specimen Description   Final    BLOOD LEFT ANTECUBITAL Performed at [facility], [address], [place], [place] [address]    Special Requests   Final    BOTTLES DRAWN AEROBIC AND ANAEROBIC Blood Culture results may not be optimal due to an inadequate volume of blood received in culture bottles Performed at [facility], [address], [place], [place] [address]    Culture   Final    NO GROWTH 5 DAYS Performed at [facility], [address], [place], [place] [address]    Report Status 11/03/2024 FINAL  Final  Culture, blood (Routine X 2) w Reflex to ID Panel     Status: None   Collection Time: 10/29/24  8:40 AM   Specimen: BLOOD LEFT HAND  Result Value Ref Range Status   Specimen Description   Final    BLOOD LEFT HAND Performed at [facility], [address] [place], [place] [address]    Special  Requests   Final    BOTTLES DRAWN AEROBIC AND ANAEROBIC Blood Culture adequate volume Performed at [facility], [address] [place], [place] [address]    Culture   Final    NO GROWTH 5 DAYS Performed at [facility],  [address], [place], [place] [address]    Report Status 11/03/2024 FINAL  Final     Time coordinating discharge: 35 minutes  SIGNED:   Renato Applebaum, MD  [facility] 11/07/2024, 10:31 AM     [1]  Allergies Allergen Reactions   Alendronate Sodium Other (See Comments)    GI Upset (intolerance)   Codeine Nausea And Vomiting    SICK ON STOMACH   Lovastatin Nausea Only   Penicillin G Hives    Big welts Tolerates Ancef    Pravastatin Other (See Comments)     Myalgias (intolerance)   Tetanus Toxoid Hives   Tetanus Toxoid, Adsorbed Hives   Penicillins Rash   Tetanus Toxoid-Containing Vaccines Rash

## 2024-11-13 ENCOUNTER — Ambulatory Visit (HOSPITAL_BASED_OUTPATIENT_CLINIC_OR_DEPARTMENT_OTHER): Admitting: Student

## 2024-12-07 ENCOUNTER — Ambulatory Visit (HOSPITAL_BASED_OUTPATIENT_CLINIC_OR_DEPARTMENT_OTHER): Admitting: Family Medicine

## 2024-12-31 ENCOUNTER — Ambulatory Visit (HOSPITAL_BASED_OUTPATIENT_CLINIC_OR_DEPARTMENT_OTHER): Admitting: Student

## 2025-04-18 ENCOUNTER — Ambulatory Visit
